# Patient Record
Sex: Female | Born: 1945
Health system: Southern US, Community
[De-identification: ages and names within clinical notes are randomized; demographics above are authoritative.]

## PROBLEM LIST (undated history)

## (undated) DIAGNOSIS — I10 Essential (primary) hypertension: Secondary | ICD-10-CM

## (undated) DIAGNOSIS — M1990 Unspecified osteoarthritis, unspecified site: Secondary | ICD-10-CM

## (undated) DIAGNOSIS — E119 Type 2 diabetes mellitus without complications: Secondary | ICD-10-CM

## (undated) DIAGNOSIS — J189 Pneumonia, unspecified organism: Secondary | ICD-10-CM

## (undated) HISTORY — PX: NASAL SINUS SURGERY: SHX719

## (undated) HISTORY — DX: Unspecified osteoarthritis, unspecified site: M19.90

## (undated) HISTORY — PX: BREAST LUMPECTOMY: SHX2

## (undated) HISTORY — PX: CATARACT EXTRACTION: SUR2

## (undated) HISTORY — PX: CARPAL TUNNEL RELEASE: SHX101

## (undated) HISTORY — PX: TUBAL LIGATION: SHX77

## (undated) HISTORY — PX: OTHER SURGICAL HISTORY: SHX169

## (undated) HISTORY — PX: ROTATOR CUFF REPAIR: SHX139

## (undated) HISTORY — PX: CHOLECYSTECTOMY: SHX55

## (undated) HISTORY — DX: Essential (primary) hypertension: I10

## (undated) HISTORY — PX: ANKLE SURGERY: SHX546

---

## 1998-03-10 ENCOUNTER — Other Ambulatory Visit: Admission: RE | Admit: 1998-03-10 | Discharge: 1998-03-10 | Payer: Self-pay | Admitting: Gynecology

## 1998-11-11 ENCOUNTER — Other Ambulatory Visit: Admission: RE | Admit: 1998-11-11 | Discharge: 1998-11-11 | Payer: Self-pay | Admitting: Gynecology

## 1999-05-11 ENCOUNTER — Other Ambulatory Visit: Admission: RE | Admit: 1999-05-11 | Discharge: 1999-05-11 | Payer: Self-pay | Admitting: Gynecology

## 2000-05-15 ENCOUNTER — Other Ambulatory Visit: Admission: RE | Admit: 2000-05-15 | Discharge: 2000-05-15 | Payer: Self-pay | Admitting: Gynecology

## 2000-05-30 ENCOUNTER — Ambulatory Visit: Admission: RE | Admit: 2000-05-30 | Discharge: 2000-05-30 | Payer: Self-pay | Admitting: Gynecology

## 2000-05-30 ENCOUNTER — Encounter (INDEPENDENT_AMBULATORY_CARE_PROVIDER_SITE_OTHER): Payer: Self-pay | Admitting: Specialist

## 2001-05-21 ENCOUNTER — Other Ambulatory Visit: Admission: RE | Admit: 2001-05-21 | Discharge: 2001-05-21 | Payer: Self-pay | Admitting: Gynecology

## 2001-09-17 ENCOUNTER — Encounter (INDEPENDENT_AMBULATORY_CARE_PROVIDER_SITE_OTHER): Payer: Self-pay | Admitting: Specialist

## 2001-09-17 ENCOUNTER — Inpatient Hospital Stay (HOSPITAL_COMMUNITY): Admission: EM | Admit: 2001-09-17 | Discharge: 2001-09-20 | Payer: Self-pay | Admitting: Internal Medicine

## 2002-02-05 ENCOUNTER — Encounter: Payer: Self-pay | Admitting: Gastroenterology

## 2002-02-05 ENCOUNTER — Ambulatory Visit (HOSPITAL_COMMUNITY): Admission: RE | Admit: 2002-02-05 | Discharge: 2002-02-05 | Payer: Self-pay | Admitting: Gastroenterology

## 2002-06-02 ENCOUNTER — Other Ambulatory Visit: Admission: RE | Admit: 2002-06-02 | Discharge: 2002-06-02 | Payer: Self-pay | Admitting: Gynecology

## 2003-02-01 ENCOUNTER — Encounter: Payer: Self-pay | Admitting: Internal Medicine

## 2003-02-01 ENCOUNTER — Encounter: Admission: RE | Admit: 2003-02-01 | Discharge: 2003-02-01 | Payer: Self-pay | Admitting: Internal Medicine

## 2003-06-04 ENCOUNTER — Other Ambulatory Visit: Admission: RE | Admit: 2003-06-04 | Discharge: 2003-06-04 | Payer: Self-pay | Admitting: Gynecology

## 2004-05-30 ENCOUNTER — Other Ambulatory Visit: Admission: RE | Admit: 2004-05-30 | Discharge: 2004-05-30 | Payer: Self-pay | Admitting: Gynecology

## 2005-01-15 ENCOUNTER — Encounter: Admission: RE | Admit: 2005-01-15 | Discharge: 2005-01-15 | Payer: Self-pay | Admitting: Orthopedic Surgery

## 2005-02-22 ENCOUNTER — Observation Stay (HOSPITAL_COMMUNITY): Admission: RE | Admit: 2005-02-22 | Discharge: 2005-02-23 | Payer: Self-pay | Admitting: Orthopedic Surgery

## 2005-06-22 ENCOUNTER — Other Ambulatory Visit: Admission: RE | Admit: 2005-06-22 | Discharge: 2005-06-22 | Payer: Self-pay | Admitting: Gynecology

## 2005-07-14 ENCOUNTER — Encounter: Admission: RE | Admit: 2005-07-14 | Discharge: 2005-07-14 | Payer: Self-pay | Admitting: Orthopedic Surgery

## 2005-12-11 ENCOUNTER — Ambulatory Visit: Payer: Self-pay | Admitting: Internal Medicine

## 2005-12-21 ENCOUNTER — Ambulatory Visit: Payer: Self-pay

## 2008-06-30 ENCOUNTER — Ambulatory Visit (HOSPITAL_COMMUNITY): Admission: RE | Admit: 2008-06-30 | Discharge: 2008-06-30 | Payer: Self-pay | Admitting: General Surgery

## 2008-06-30 ENCOUNTER — Encounter (INDEPENDENT_AMBULATORY_CARE_PROVIDER_SITE_OTHER): Payer: Self-pay | Admitting: General Surgery

## 2008-06-30 ENCOUNTER — Encounter (INDEPENDENT_AMBULATORY_CARE_PROVIDER_SITE_OTHER): Payer: Self-pay | Admitting: *Deleted

## 2010-01-06 ENCOUNTER — Telehealth: Payer: Self-pay | Admitting: Gastroenterology

## 2010-01-06 ENCOUNTER — Ambulatory Visit: Payer: Self-pay | Admitting: Internal Medicine

## 2010-01-06 ENCOUNTER — Encounter: Payer: Self-pay | Admitting: Gastroenterology

## 2010-01-06 DIAGNOSIS — R933 Abnormal findings on diagnostic imaging of other parts of digestive tract: Secondary | ICD-10-CM

## 2010-01-06 DIAGNOSIS — R932 Abnormal findings on diagnostic imaging of liver and biliary tract: Secondary | ICD-10-CM

## 2010-01-06 DIAGNOSIS — R1031 Right lower quadrant pain: Secondary | ICD-10-CM

## 2010-01-06 DIAGNOSIS — R110 Nausea: Secondary | ICD-10-CM | POA: Insufficient documentation

## 2010-01-06 DIAGNOSIS — R1033 Periumbilical pain: Secondary | ICD-10-CM | POA: Insufficient documentation

## 2010-01-06 DIAGNOSIS — R1030 Lower abdominal pain, unspecified: Secondary | ICD-10-CM | POA: Insufficient documentation

## 2010-01-14 ENCOUNTER — Ambulatory Visit: Payer: Self-pay | Admitting: Gastroenterology

## 2010-01-14 ENCOUNTER — Ambulatory Visit (HOSPITAL_COMMUNITY): Admission: RE | Admit: 2010-01-14 | Discharge: 2010-01-14 | Payer: Self-pay | Admitting: Gastroenterology

## 2010-01-15 LAB — HM COLONOSCOPY

## 2010-01-18 ENCOUNTER — Encounter: Payer: Self-pay | Admitting: Gastroenterology

## 2010-09-08 NOTE — Procedures (Signed)
Summary: Colonoscopy  Patient: Denise [name] Note: All result statuses are Final unless otherwise noted.  Tests: (1) Colonoscopy (COL)   COL Colonoscopy           DONE     [facility]     [address] [place], [place]  [address]           COLONOSCOPY PROCEDURE REPORT           PATIENT:  Denise, [name]  MR#:  [id number]     BIRTHDATE:  August 13, 1945, 64 yrs. old  GENDER:  female     ENDOSCOPIST:  Barbette Hair. Arlyce Dice, MD     REF. BY:     PROCEDURE DATE:  01/14/2010     PROCEDURE:  Colonoscopy with snare polypectomy     ASA CLASS:  Class I     INDICATIONS:  colorectal cancer screening, average risk     MEDICATIONS:   Fentanyl 100 mcg, Versed 10 mg IV, Benadryl 50 mg     IV           DESCRIPTION OF PROCEDURE:   After the risks benefits and     alternatives of the procedure were thoroughly explained, informed     consent was obtained.  Digital rectal exam was performed and     revealed no abnormalities.   The  endoscope was introduced through     the anus and advanced to the cecum, which was identified by both     the appendix and ileocecal valve, without limitations.  The     quality of the prep was excellent, using MoviPrep.  The instrument     was then slowly withdrawn as the colon was fully examined.     <<PROCEDUREIMAGES>>           FINDINGS:  A pedunculated polyp was found in the sigmoid colon. It     was 15 mm in size. It was found 22 cm from the point of entry.     Polyp was snared, then cauterized with monopolar cautery.     Retrieval was successful (see image9 and image10). snare polyp     Mild diverticulosis was found in the sigmoid colon (see image11 and     image12).  Internal hemorrhoids were found (see image14).  This     was otherwise a normal examination of the colon (see image1,     image2, image3, image5, image6, and image8).   Retroflexed views     in the rectum revealed no abnormalities.    The scope was then     withdrawn from the patient and  the procedure completed.           COMPLICATIONS:  None     ENDOSCOPIC IMPRESSION:     1) 15 mm pedunculated polyp in the sigmoid colon     2) Mild diverticulosis in the sigmoid colon     3) Internal hemorrhoids     4) Otherwise normal examination     RECOMMENDATIONS:     1) Colonoscopy     REPEAT EXAM:  In 3 year(s) for Colonoscopy. in view of size of     polyp; sedation with propofol           ______________________________     Barbette Hair. Arlyce Dice, MD           CCMorton Amy MD           n.     eSIGNED:  Barbette Hair. Damika Harmon at 01/14/2010 03:05 PM           Briley, Elease Hashimoto, [phone]  Note: An exclamation mark (!) indicates a result that was not dispersed into the flowsheet. Document Creation Date: 01/14/2010 3:07 PM _______________________________________________________________________  (1) Order result status: Final Collection or observation date-time: 01/14/2010 14:59 Requested date-time:  Receipt date-time:  Reported date-time:  Referring Physician:   Ordering Physician: Melvia Heaps [phone]) Specimen Source:  Source: Launa Grill Order Number: [id number] Lab site:   Appended Document: Colonoscopy Recall is in IDX for 01/2013.

## 2010-09-08 NOTE — Assessment & Plan Note (Signed)
Summary: severe abd pain...em                (DR.KAPLAN PT./DEB)   History of Present Illness Visit Type: Initial Consult Primary GI MD: Melvia Heaps MD [facility] Primary Provider: Blane Ohara, MD Requesting Provider: Blane Ohara, MD Chief Complaint: Severe abdominal pain x1 week History of Present Illness:   PLEASANT 65 Y.O FEMALE KNOWN REMOTELY TO DR. KAPLAN FROM ENDOSCOPY IN 2003 WHICH WAS NORMAL. SHE IS S/P CHOLECYSTECTOMY AND NORMAL IOC IN 2009. SHE COMES IN TODAY AFTER ONSET OF INTENSE MID ABDOMINAL PAIN ON SUNDAY 5/29. SHE DESCRIBES IT AS  SEVERE CRAMPY PAINS FOLLOWED BY [facility]. SHE HAD NAUSEA BUT NO VOMITING. EPISODES OF CRAMY PAIN ALL NIGHT,FELT URGE FOR BM BUT NO DIARRHEA. NO DIAPHORESIS. SHE CONTINUED TO HAVE PAIN MONDAY,TUESDAY BETTER DURING THE DAY THEN WORSE THAT NIGHT. SHE WAS SEEN [place] DR COX,HAD LABS YESTERDAY INCLUDING LFT'S AND LIPASE-ALL NORMAL. SHE UNDERWENT CT ABD/PELVIS WITH CONTRAST WHICH SHOWED NO ACUTE PROBLEM. SHE DOES HAVE MULTIPLE HEPATIC CYSTS,THE LARGEST 3 CM,INCREASED STOOL IN COLON. TODAY AND LAST NIGHT HAS FELT BETTER. SHE HAS BEEN ON BENTYL 10 MG ONCE DAILY FOR A YEAR OR SO FOR ABDOMINAL DISCOMFORT, GAS ETC. SHE HAS INCREASED IT TO TWICE DAILY NOW. SHE IS HAVING BM'S, NO MELENA OR HEME. NO FEVER. HAS BEEN ON CELEBREX ONCE DAILY LONG TERM.   GI Review of Systems    Reports abdominal pain, bloating, chest pain, and  nausea.     Location of  Abdominal pain: upper abdomen.    Denies acid reflux, belching, dysphagia with liquids, dysphagia with solids, heartburn, loss of appetite, vomiting, vomiting blood, weight loss, and  weight gain.        Denies anal fissure, black tarry stools, change in bowel habit, constipation, diarrhea, diverticulosis, fecal incontinence, heme positive stool, hemorrhoids, irritable bowel syndrome, jaundice, light color stool, liver problems, rectal bleeding, and  rectal pain. Preventive Screening-Counseling & Management  Alcohol-Tobacco  Smoking Status: never      Drug Use:  no.      Current Medications (verified): 1)  Multivitamins  Tabs (Multiple Vitamin) .... Once Daily 2)  Caltrate 600 1500 Mg Tabs (Calcium Carbonate) .... Once Daily 3)  Vitamin C Cr 500 Mg Cr-Tabs (Ascorbic Acid) .... Once Daily 4)  Celebrex 200 Mg Caps (Celecoxib) .... Once Daily 5)  Evista 60 Mg Tabs (Raloxifene Hcl) .... Once Daily 6)  Dicyclomine Hcl 10 Mg Caps (Dicyclomine Hcl) .... Two Times A Day  Allergies (verified): No Known Drug Allergies  Past History:  Past Medical History: Arthritis DVT -childhood  Past Surgical History: Breast-Lumpectomy x5 Cholecystectomy Rotator Cuff Repair Tubal Ligation Ankle surgery Cataract Extraction  Family History: Family History of Breast Cancer:Aunts Family History of Liver Disease/Cirrhosis:Father No FH of Colon Cancer: Bone Cancer: Mother  Social History: Occupation: Self employed Patient has never smoked.  Alcohol Use - no Daily Caffeine Use Illicit Drug Use - no Smoking Status:  never Drug Use:  no  Review of Systems       The patient complains of arthritis/joint pain, back pain, fatigue, and shortness of breath.  The patient denies allergy/sinus, anemia, anxiety-new, blood in urine, breast changes/lumps, change in vision, confusion, cough, coughing up blood, depression-new, fainting, fever, headaches-new, hearing problems, heart murmur, heart rhythm changes, itching, menstrual pain, muscle pains/cramps, night sweats, nosebleeds, pregnancy symptoms, skin rash, sleeping problems, sore throat, swelling of feet/legs, swollen lymph glands, thirst - excessive , urination - excessive , urination changes/pain, urine leakage, vision  changes, and voice change.         ROS OTHERWISE AS IN HPI  Vital Signs:  Patient profile:   65 year old female Height:      62 inches Weight:      166 pounds BMI:     30.47 Pulse rate:   76 / minute Pulse rhythm:   regular BP sitting:   132 / 76  (left  arm) Cuff size:   regular  Vitals Entered By: June McMurray CMA Duncan Dull) (January 06, 2010 1:35 PM)  Physical Exam  General:  Well developed, well nourished, no acute distress. Head:  Normocephalic and atraumatic. Eyes:  PERRLA, no icterus. Lungs:  Clear throughout to auscultation. Heart:  Regular rate and rhythm; no murmurs, rubs,  or bruits. Abdomen:  SOFT, MILD TENDERNESS, RMQ, NO MASS OR HSM, NO GUARDING, NO DISTENTION, BS+ Rectal:  NOT DONE Extremities:  No clubbing, cyanosis, edema or deformities noted. Neurologic:  Alert and  oriented x4;  grossly normal neurologically. Psych:  Alert and cooperative. Normal mood and affect.   Impression & Recommendations:  Problem # 1:  ABDOMINAL PAIN, PERIUMBILIC (ICD-789.05) Assessment New 65 Y.O FEMALE WITH 5 DAY HX OF PERIUMBILICAL ABDOMINAL CRAMPING AND NAUSEA, IMPROVING. NORMAL LABS ,UNREMARKABLE CT AS TO CAUSE. SUSPECT AN INFECTIOUS GASTROENTERITIS-NOW RESOLVING.  CONTINUE BENTYL 10 MG TWICE DAILY PT ADVISED TO CALL IF PAIN RECURS SHE HAS NOT HAD SCREENING COLONOSCOPY,THIS SHOULD BE DONE, AND WILL ALSO HELP R/O COLON PATHOLOGY AS A SOURCE FOR HER PAIN. PROCEDURE DISCUSSED IN DETAIL ,SHE AGREES TO PROCEED,WILL SCHEDULE WITH DR. KAPLAN. Orders: Colonoscopy (Colon)  Problem # 2:  ABNORMAL EXAM-BILIARY TRACT (ICD-793.3) Assessment: Comment Only SEVERAL HEPATIC CYSTS ON CT.  Patient Instructions: 1)  Continue the Dicyclomine 10 MG twice daily. 2)  We scheduled the Colonoscopy with Dr. Arlyce Dice for Friday 01-14-10 at 2:00 PM.   3)  Directions and brochure given. 4)  [facility] Patient Information Guide given to patient. 5)  We sent the perscription for the Moviprep you will be drinking for the procedure to [facility]   [address], [place] Levan.  6)  Copy sent to : Danie Binder, MD 7)  The medication list was reviewed and reconciled.  All changed / newly prescribed medications were explained.  A complete medication list was provided to  the patient / caregiver. Prescriptions: MOVIPREP 100 GM  SOLR (PEG-KCL-NACL-NASULF-NA ASC-C) As per prep instructions.  #1 x 0   Entered by:   Lowry Ram NCMA   Authorized by:   Sammuel Cooper PA-c   Signed by:   Lowry Ram NCMA on 01/06/2010   Method used:   [facility]ronically to        [facility]  [id number] (retail)       [address]       [place], [place]  [address]       Ph: [phone] or [phone]       Fax: [phone]   RxID:   [id number]

## 2010-09-08 NOTE — Letter (Signed)
Summary: Patient Notice- Polyp Results  [facility]  [address] [place], [place] [address]   Phone: [phone]  Fax: [phone]        January 18, 2010 MRN: [id number]    [facility] [address] [place], [place]  [address]    Dear Ms. Forst,  I am pleased to inform you that the colon polyp(s) removed during your recent colonoscopy was (were) found to be benign (no cancer detected) upon pathologic examination.  I recommend you have a repeat colonoscopy examination in 3_ years to look for recurrent polyps, as having colon polyps increases your risk for having recurrent polyps or even colon cancer in the future.  Should you develop new or worsening symptoms of abdominal pain, bowel habit changes or bleeding from the rectum or bowels, please schedule an evaluation with either your primary care physician or with me.  Additional information/recommendations:  __ No further action with gastroenterology is needed at this time. Please      follow-up with your primary care physician for your other healthcare      needs.  __ Please call [phone] to schedule a return visit to review your      situation.  __ Please keep your follow-up visit as already scheduled.  _x_ Continue treatment plan as outlined the day of your exam.  Please call us if you are having persistent problems or have questions about your condition that have not been fully answered at this time.  Sincerely,  Louis Meckel MD  This letter has been electronically signed by your physician.  Appended Document: Patient Notice- Polyp Results Letter mailed to patient. Recall is in IDX for 01/2013.

## 2010-09-08 NOTE — Letter (Signed)
Summary: [facility] Instructions  [facility]  [address] [place], [place] [address]   Phone: [phone]  Fax: [phone]       Denise [name]    06-04-1946    MRN: [id number]        Procedure Day /Date: 01-14-10     Arrival Time: 1:00 PM      Procedure Time: 2:00 PM     Location of Procedure:                     X      [facility] ( Outpatient Registration)                        PREPARATION FOR COLONOSCOPY WITH MOVIPREP   Starting 5 days prior to your procedure 9:00 AM  do not eat nuts, seeds, popcorn, corn, beans, peas,  salads, or any raw vegetables.  Do not take any fiber supplements (e.g. Metamucil, Citrucel, and Benefiber).  THE DAY BEFORE YOUR PROCEDURE         DATE: 01-13-10  UVO:[id number]  1.  Drink clear liquids the entire day-NO SOLID FOOD  2.  Do not drink anything colored red or purple.  Avoid juices with pulp.  No orange juice.  3.  Drink at least 64 oz. (8 glasses) of fluid/clear liquids during the day to prevent dehydration and help the prep work efficiently.  CLEAR LIQUIDS INCLUDE: Water Jello Ice Popsicles Tea (sugar ok, no milk/cream) Powdered fruit flavored drinks Coffee (sugar ok, no milk/cream) Gatorade Juice: apple, white grape, white cranberry  Lemonade Clear bullion, consomm, broth Carbonated beverages (any kind) Strained chicken noodle soup Hard Candy                             4.  In the morning, mix first dose of MoviPrep solution:    Empty 1 Pouch A and 1 Pouch B into the disposable container    Add lukewarm drinking water to the top line of the container. Mix to dissolve    Refrigerate (mixed solution should be used within 24 hrs)  5.  Begin drinking the prep at 5:00 p.m. The MoviPrep container is divided by 4 marks.   Every 15 minutes drink the solution down to the next mark (approximately 8 oz) until the full liter is complete.   6.  Follow completed prep with 16 oz of clear liquid of your choice  (Nothing red or purple).  Continue to drink clear liquids until bedtime.  7.  Before going to bed, mix second dose of MoviPrep solution:    Empty 1 Pouch A and 1 Pouch B into the disposable container    Add lukewarm drinking water to the top line of the container. Mix to dissolve    Refrigerate  THE DAY OF YOUR PROCEDURE      DATE: 01-14-10 DAY: Friday  Beginning at 9:00 AM  (5 hours before procedure):         1. Every 15 minutes, drink the solution down to the next mark (approx 8 oz) until the full liter is complete.  2. Follow completed prep with 16 oz. of clear liquid of your choice.    3. You may drink clear liquids until 10:00 AM  (4 HOURS BEFORE PROCEDURE).   MEDICATION INSTRUCTIONS  Unless otherwise instructed, you should take regular prescription medications with a small sip of water  as early as possible the morning of your procedure.        OTHER INSTRUCTIONS  You will need a responsible adult at least 65 years of age to accompany you and drive you home.   This person must remain in the waiting room during your procedure.  Wear loose fitting clothing that is easily removed.  Leave jewelry and other valuables at home.  However, you may wish to bring a book to read or  an iPod/MP3 player to listen to music as you wait for your procedure to start.  Remove all body piercing jewelry and leave at home.  Total time from sign-in until discharge is approximately 2-3 hours.  You should go home directly after your procedure and rest.  You can resume normal activities the  day after your procedure.  The day of your procedure you should not:   Drive   Make legal decisions   Operate machinery   Drink alcohol   Return to work  You will receive specific instructions about eating, activities and medications before you leave.    The above instructions have been reviewed and explained to me by   _______________________    I fully understand and can verbalize  these instructions _____________________________ Date _________

## 2010-09-08 NOTE — Procedures (Signed)
Summary: ENDOSCOPY   EGD  Procedure date:  02/05/2002  Findings:      Location: [facility]     Patient Name: Denise [name], Denise [name]. MRN: [id number] Procedure Procedures: Panendoscopy (EGD) CPT: 43235.  Personnel: Endoscopist: Barbette Hair. Arlyce Dice, MD.  Indications Symptoms: Chronic cough.  History  Pre-Exam Physical: Performed Feb 05, 2002  Entire physical exam was normal.  Exam Exam Info: Maximum depth of insertion Duodenum, intended Duodenum. Vocal cords visualized. Gastric retroflexion performed. ASA Classification: II. Tolerance: good.  Sedation Meds: Robinul 0.2 given IV. Fentanyl 75 mcg. given IV. Versed 7 mg. given IV. Cetacaine Spray 2 sprays given aerosolized.  Monitoring: BP and pulse monitoring done. Oximetry used. Supplemental O2 given at 2 Liters.  Findings - Normal: Proximal Esophagus to Duodenal 2nd Portion.   Assessment Normal examination.  Events  Unplanned Intervention: No unplanned interventions were required.  Unplanned Events: There were no complications. Plans Medication(s): Continue current medications. PPI: Lansoprazole/Prevacid 30 mg QD, starting Feb 05, 2002   Comments: Lower prevacid to QD Scheduling: Office Visit, to [employer]. Arlyce Dice, MD, around Mar 05, 2002.    CC: Denise [name]   This report was created from the original endoscopy report, which was reviewed and signed by the above listed endoscopist.

## 2010-09-08 NOTE — Procedures (Signed)
Summary: Preparation for Colonoscopy / [facility]  Preparation for Colonoscopy / [facility]   Imported By: Lennie Odor 01/13/2010 15:31:21  _____________________________________________________________________  External Attachment:    Type:   Image     Comment:   External Document

## 2010-09-08 NOTE — Progress Notes (Signed)
Summary: ASAP APPT.  Phone Note From Other Clinic Call back at [phone]   Caller: Eber Jones from Dr Blane Ohara office Call For: DR.KAPLAN Reason for Call: Schedule Patient Appt Summary of Call: Dr Sedalia Muta would like this patient before appt given to her 7-8 for severe abd pain off/on for about a week.  (on scale from 1-10 is a 10) Initial call taken by: Tawni Levy,  January 06, 2010 9:59 AM  Follow-up for Phone Call        Pt. can see Mike Gip [facility] today or tomorrow, Eber Jones will call pt. and call back to confirm a date/time. Follow-up by: Laureen Ochs LPN,  January 07, 8755 11:22 AM  Additional Follow-up for Phone Call Additional follow up Details #1::        Pt. will see Mike Gip [facility] today at 2pm. Eber Jones will fax records to [facility] Additional Follow-up by: Laureen Ochs LPN,  January 07, 4331 11:32 AM

## 2010-09-08 NOTE — Op Note (Signed)
Summary: OPERATIVE REPORT  NAME:  Denise [name]            ACCOUNT NO.:  [id number]      MEDICAL RECORD NO.:  [id number]          PATIENT TYPE:  AMB      LOCATION:  DAY                          FACILITY:  [facility]      PHYSICIAN:  Anselm Pancoast. Weatherly, M.D.DATE OF BIRTH:  06/20/1946      DATE OF PROCEDURE:  06/30/2008   DATE OF DISCHARGE:                                  OPERATIVE REPORT      REFERRING PHYSICIAN:  [facility].      PREOPERATIVE DIAGNOSES:  Chronic cholelithiasis with stones.      POSTOPERATIVE DIAGNOSES:  Chronic cholecystitis with stones.      OPERATION:  Laparoscopic cholecystectomy with cholangiogram.      SURGEON:  Anselm Pancoast. Zachery Dakins, M.D.      ASSISTANT:  Angelia Mould. Derrell Lolling, M.D.      HISTORY:  Denise [name] is a 62-year Caucasian female who I saw   approximately 2 months ago after she was having episodes of symptomatic   gallstones.  She works as a Nurse, learning disability for the deaf and wanted to   postpone her surgery until now so she has got a short week to miss as   little work as possible.  She is here for the planned procedure and said   that she has had to three episodes of pain that lasted an hour or so   during this 2 month interval, but has  had no real severe attacks like   she had prior to adjusting her diet.  Her preoperative repeat laboratory   studies were all normal and preoperatively she was given 3 grams of   Unasyn and PAS stockings.      The patient was taken to the operative suite.  Induction of general   anesthesia endotracheal tube, oral tube into the stomach and the abdomen   was prepped with Betadine solution and draped in a sterile manner.  I   think she has had a previous tubal ligation and I made a little vertical   incision below the umbilicus.  The fascia was identified, picked up and   a small opening made and we carefully entered into the peritoneal   cavity.  A pursestring suture of zero Vicryl was placed and then the   Hasson cannula introduced.  The gallbladder, you could see the stones in   the gallbladder, but there is chronic thin adhesions around it.  These   were carefully taken down after the upper 10 mm trocar had been placed   in the subxiphoid and two lateral 5 mm trocars placed by Dr. Derrell Lolling.  We   freed up and went down and could see the stone impacted in the neck of   the gallbladder and cystic duct junction.  The adhesions around this   were carefully divided.  You could see the cystic artery that was   clipped proximally and then after I encompassed the cystic duct and   pushed the stone back into the actual gallbladder I placed a clip on the   cystic duct.  The small opening made and a Cook catheter introduced and   the cholangiogram obtained.  There was good prompt filling of   extrahepatic biliary system.  Good flow into the duodenum and no   evidence of the any common duct stones.  The catheter was removed.  The   cystic duct proximal was triply clipped, divided.  The artery, the clip   placed and it was divided and then we freed up the gallbladder from its   bed with the hook electrocautery and good hemostasis.  The gallbladder   and stones were placed in the EndoCatch bag.  The camera switched to the   upper 10 mL trocar and the bag containing the gallbladder withdrawn.   There was a little irrigating fluid that was aspirated.  No evidence any   bleeding.  I put an additional figure-of-eight suture of zero Vicryl in   the fascia at the umbilicus since she is thin and you could see into the   peritoneal cavity.  At the upper 10 mm trocar I placed a figure-of-eight   in that fascia also with   zero Vicryl.  The subcutaneous wounds were closed with Benzoin and Steri-   Strips after a couple of 4-0 Vicryl sutures had been placed   subcuticular.  The patient tolerated the procedure nicely and I think   she is planning on going home this afternoon and hopefully will be ready   to return to  work on Monday.                  ______________________________   Anselm Pancoast. Zachery Dakins, M.D.            WJW/MEDQ  D:  06/30/2008  T:  06/30/2008  Job:  [id number]      cc:   Iva Boop, MD,FACG   [facility]   [address] [place], [place] [address]

## 2010-09-15 ENCOUNTER — Other Ambulatory Visit: Payer: Self-pay | Admitting: Gynecology

## 2010-12-20 NOTE — Op Note (Signed)
NAME:  Denise [name], Denise [name]            ACCOUNT NO.:  [id number]   MEDICAL RECORD NO.:  [id number]          PATIENT TYPE:  AMB   LOCATION:  DAY                          FACILITY:  [facility]   PHYSICIAN:  Anselm Pancoast. Weatherly, M.D.DATE OF BIRTH:  [birth year]   DATE OF PROCEDURE:  06/30/2008  DATE OF DISCHARGE:                               OPERATIVE REPORT   REFERRING PHYSICIAN:  ER physicians.   PREOPERATIVE DIAGNOSES:  Chronic cholelithiasis with stones.   POSTOPERATIVE DIAGNOSES:  Chronic cholecystitis with stones.   OPERATION:  Laparoscopic cholecystectomy with cholangiogram.   SURGEON:  Anselm Pancoast. Zachery Dakins, M.D.   ASSISTANT:  Angelia Mould. Derrell Lolling, M.D.   HISTORY:  Denise [name] is a 62-year Caucasian female who I saw  approximately 2 months ago after she was having episodes of symptomatic  gallstones.  She works as a Nurse, learning disability for the deaf and wanted to  postpone her surgery until now so she has got a short week to miss as  little work as possible.  She is here for the planned procedure and said  that she has had to three episodes of pain that lasted an hour or so  during this 2 month interval, but has  had no real severe attacks like  she had prior to adjusting her diet.  Her preoperative repeat laboratory  studies were all normal and preoperatively she was given 3 grams of  Unasyn and PAS stockings.   The patient was taken to the operative suite.  Induction of general  anesthesia endotracheal tube, oral tube into the stomach and the abdomen  was prepped with Betadine solution and draped in a sterile manner.  I  think she has had a previous tubal ligation and I made a little vertical  incision below the umbilicus.  The fascia was identified, picked up and  a small opening made and we carefully entered into the peritoneal  cavity.  A pursestring suture of zero Vicryl was placed and then the  Hasson cannula introduced.  The gallbladder, you could see the stones in  the  gallbladder, but there is chronic thin adhesions around it.  These  were carefully taken down after the upper 10 mm trocar had been placed  in the subxiphoid and two lateral 5 mm trocars placed by Dr. Derrell Lolling.  We  freed up and went down and could see the stone impacted in the neck of  the gallbladder and cystic duct junction.  The adhesions around this  were carefully divided.  You could see the cystic artery that was  clipped proximally and then after I encompassed the cystic duct and  pushed the stone back into the actual gallbladder I placed a clip on the  cystic duct.  The small opening made and a Cook catheter introduced and  the cholangiogram obtained.  There was good prompt filling of  extrahepatic biliary system.  Good flow into the duodenum and no  evidence of the any common duct stones.  The catheter was removed.  The  cystic duct proximal was triply clipped, divided.  The artery, the clip  placed and it was divided and  then we freed up the gallbladder from its  bed with the hook electrocautery and good hemostasis.  The gallbladder  and stones were placed in the EndoCatch bag.  The camera switched to the  upper 10 mL trocar and the bag containing the gallbladder withdrawn.  There was a little irrigating fluid that was aspirated.  No evidence any  bleeding.  I put an additional figure-of-eight suture of zero Vicryl in  the fascia at the umbilicus since she is thin and you could see into the  peritoneal cavity.  At the upper 10 mm trocar I placed a figure-of-eight  in that fascia also with  zero Vicryl.  The subcutaneous wounds were closed with Benzoin and Steri-  Strips after a couple of 4-0 Vicryl sutures had been placed  subcuticular.  The patient tolerated the procedure nicely and I think  she is planning on going home this afternoon and hopefully will be ready  to return to work on Monday.           ______________________________  Anselm Pancoast. Zachery Dakins, M.D.      WJW/MEDQ  D:  06/30/2008  T:  06/30/2008  Job:  [id number]   cc:   Iva Boop, MD,FACG  [facility]  [address] [place], [place] [address]

## 2010-12-23 NOTE — Consult Note (Signed)
[facility]  Patient:    Denise [name], Denise [name]                          MRN: [id number] Attending:  Rande Brunt. Stanford Breed, M.D. CC:         Luvenia Redden, M.D.  Telford Nab, R.N.   Consultation Report  HISTORY OF PRESENT ILLNESS:  A 65 year old white female referred by Luvenia Redden, M.D., for evaluation of hyperpigmented lesions on the vulva.  The patient is perimenopausal.  She is not certain as to when she first developed these hyperpigmented lesions, as they are entirely asymptomatic. They were identified by Dr. Lodema Hong on his visit with the patient on October 9.  She denies any vulvitis, pain, pressure, or any other gynecologic symptoms.  She has no past history of abnormal Pap smears.  PHYSICAL EXAMINATION:  VITAL SIGNS:  Height 5 feet 2 inches, weight 164 pounds.  Blood pressure 130/94, pulse 84, respiratory rate 16.  ABDOMEN:  Soft, nontender.  No mass, organomegaly, ascites, or hernias are noted.  PELVIC:  EG, BUS is normal anatomically, although there are patchy hyperpigmented areas on both sides of the vulva.  The vagina is otherwise normal.  Cervix is normal.  PROCEDURE NOTE:  Since we were uncertain as to the etiology of this hyperpigmented area, biopsy will be obtained.  After Betadine prep and 1% Xylocaine anesthesia, a biopsy is obtained from the hyperpigmented area on the right labia minor.  Silver nitrate is used for hemostasis.  IMPRESSION:  Hyperpigmented lesion of the vulva, probably benign.  We will await biopsy reports before making further recommendations. DD:  05/30/00 TD:  05/31/00 Job: [id number]

## 2010-12-23 NOTE — Discharge Summary (Signed)
[facility]  Patient:    Denise [name], Denise [name] [facility] Visit Number: [id number] MRN: [id number]          Service Type: MED Location: [phone] Attending Physician:  Avie Echevaria Dictated by:   Earley Favor, RN, MSN, ACNP Admit Date:  09/17/2001 Discharge Date: 09/20/2001                             Discharge Summary  DISCHARGE DIAGNOSES: 1. Cyclic cough. 2. Sinusitis.  HISTORY OF PRESENT ILLNESS:  This is a 65 year old patient of Dr. Sandrea Hughs whom he saw August 29, 2001, for chronic cough.  She had been treated for the previous 10 months by Dr. Shelle Iron for asthma with followup pulmonary function tests demonstrating no active asthma.  After seeing Dr. Danice Goltz on August 08, 2001, she presented to Dr. Sandrea Hughs for treatment for cyclic cough.  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  She had high-dose Tussionex with about 25% improvement in her cough, but she continues to have a cyclic cough.  This cough get to the point where she becomes incontinent of urine with stress.  She presented to Dr. Sandrea Hughs after one week of treatment.  Tussionex was continued.  She was also given Singulair and Neurontin.  Unfortunately, she did not improve with outpatient pharmaceutical intervention and, therefore, was admitted for further evaluation and treatment due to refractory nature of her cough.  PROCEDURES:  Fiberoptic bronchoscopy performed by Dr. Sandrea Hughs with bronchoalveolar lavage performed.  She was noted to have severe and chronic bronchitis of unclear etiology.  Of note, the larynx was exquisitely sensitive to topical lidocaine but otherwise unremarkable in terms of structure and function.  There was diffuse evidence of airway edema and erythema with no purulent  secretions identified.  However, the mucosa appeared more friable than normal.  No endobronchial lesions or abnormalities were appreciated.  LABORATORY DATA:  Limited CT of the sinuses showed right maxillary and sphenoid sinus disease.  AFB cultures and smears are pending.  Respiratory cultures are pending.  Fluid cell type demonstrates reddened color, turbid appearance, wbcs 7.8, neutrophils 95, lymphocytes 4, monocytes 1, and eosinophils 0.  WBC 13.3, hemoglobin 15.4, hematocrit 46.7, platelets 342.  Sodium 137, potassium 3.2, chloride 103, CO2 26, glucose 98, BUN 20, creatinine 1.0, calcium 9.2.  HOSPITAL COURSE:  #1 - CYCLIC COUGH:  Of note, the cyclic cough did improve with very aggressive cough suppressant consistent of codeine around the clock along with Delsym cough suppressant with treatment for gastroesophageal reflux disease with proton pump inhibitors along with Reglan for increased GI motility.  Her cough did improve with these interventions.  #2 - SINUSITIS:  CT of the sinuses demonstrated sinusitis most likely a culprit of one component of her cyclic cough.  She was treated aggressively with antimicrobial therapy along with nasal hygiene.  She was discharged home on a prednisone taper along with 19 more days to equal 21 days of antimicrobial therapy with Augmentin.  DISCHARGE MEDICATIONS:  1. E-Vista 60 mg q.d.  2. Nexium 40 mg 1 b.i.d.  3. Delsym 5 cc cough syrup b.i.d. p.r.n. for cough.  4. Tussionex 100 mg 2 tablets 4 times a day p.r.n. for cough.  5. Codeine 60 mg 1 tablet  every 4 hours p.r.n. for cough.  6. Reglan 10 mg q.i.d.  7. Augmentin 175 mg 1 tablet b.i.d. for the next 19 days.  8. Nasal hygiene consisting of Afrin nasal spray 1 puff b.i.d. for     5 days and then stop.  9. Nasalide 1 puff b.i.d. 5 minutes after Afrin. 10. Salt water nasal spray 2 puffs q.i.d. 11. Prednisone taper 40 mg for 3 days, 30 mg for 3 days, 20 mg for     3 days, 10 mg for 3  days, and then stop.  DIET:  No restriction.  SPECIAL INSTRUCTIONS:  She was given a flutter valve to utilize 4 times a day and given instructions on how to utilize it.  FOLLOWUP:  She has a followup appointment with Dr. Sandrea Hughs September 26, 2001, at 2:20 p.m.  DISPOSITION/CONDITION UPON DISCHARGE:  Cyclic cough is improved with aggressive cough suppression.  Sinusitis has been demonstrated with limited CT of the sinuses, and this is being treated with aggressive antimicrobial therapy.  She is discharged home in improved condition. Dictated by:   Earley Favor, RN, MSN, ACNP Attending Physician:  Avie Echevaria DD:  09/20/01 TD:  09/20/01 Job: [id number]

## 2010-12-23 NOTE — Op Note (Signed)
NAME:  Denise [name], Denise [name]            ACCOUNT NO.:  [id number]   MEDICAL RECORD NO.:  [id number]          PATIENT TYPE:  AMB   LOCATION:  DAY                          FACILITY:  [facility]   PHYSICIAN:  Georges Lynch. Gioffre, M.D.DATE OF BIRTH:  [birth year]   DATE OF PROCEDURE:  02/22/2005  DATE OF DISCHARGE:                                 OPERATIVE REPORT   SURGEON:  Georges Lynch. Darrelyn Hillock, M.D.   ASSISTANT:  Nurse.   PREOPERATIVE DIAGNOSES:  1.  Severe impingement syndrome, left shoulder.  2.  Partial tear of the rotator cuff tendon, left shoulder.   POSTOPERATIVE DIAGNOSES:  1.  Severe impingement syndrome, left shoulder.  2.  Partial tear of the rotator cuff tendon, left shoulder.   OPERATION:  1.  Partial acromionectomy and acromioplasty, left shoulder.  2.  Repair of the rotator cuff tendon utilizing a Restore tendon graft      patch.   DESCRIPTION OF PROCEDURE:  Prior to general anesthesia, the patient had an  interscalene nerve block on the left. She was then taken back to surgery,  given a general anesthetic and a routine prep and draping of the left  shoulder was carried out. She had 1 g of IV Ancef. Following this, an  incision was made over the anterior aspect of the left shoulder, bleeders  identified and cauterized. Following that, I then went down and inserted the  self retaining retractors. I incised the deltoid tendon by sharp dissection  and partially incised the proximal portion of the deltoid muscle. I then  went down and exposed the acromion, she had severe impingement syndrome. Her  subdeltoid bursa was markedly thickened and inflamed. I protected the  underlying soft tissue structures and then utilized the oscillating saw and  did a partial acromionectomy and then used a bur to even out the  undersurface of the acromion. I then bone waxed the under surface of the  acromion. I thoroughly irrigated out the area and then inserted some  thrombin soaked Gelfoam up into the  subacromial space. Following that, I  then went down and excised the subdeltoid bursa. Her rotator cuff was quite  abraded, she had under surface tears reported on the MRI. The tendon was  thinned out so I then reinforced it with a Restore tendon graft. I  thoroughly irrigated the wound. The deltoid tendon muscle then was  reapproximated in the usual fashion. The subcu was closed with #0 Vicryl,  the skin was closed with metal staples and a sterile neosporin dressing was  applied. She was then placed in a shoulder immobilizer. The patient left the  operating room in satisfactory condition. She had 1 g of IV Ancef preop.       RAG/MEDQ  D:  02/22/2005  T:  02/22/2005  Job:  [id number]

## 2010-12-23 NOTE — Procedures (Signed)
[facility]  Patient:    Denise [name], Denise [name] [facility] Visit Number: [id number] MRN: [id number]          Service Type: MED Location: [phone] Attending Physician:  Avie Echevaria Dictated by:   Charlaine Dalton. Sherene Sires, M.D. [facility] Proc. Date: 09/18/01 Admit Date:  09/17/2001                             Procedure Report  PROCEDURE:  Fiberoptic bronchoscopy diagnostic with lavage.  REFERRING PHYSICIAN:  This patient is self-referred.  HISTORY:  This is an exceptionally challenging 65 year old white female with chronic cough that could not be managed as an outpatient.  She was only "25% better" coughing violently on high-dose narcotic cough medications, and I admitted her yesterday for evaluation.  I performed a sinus CT scan which showed minimal air fluid level on the right maxillary sinus consistent with sinusitis, but I thought her cough was markedly disproportionate to findings on CT scan and recommended a "quick look" bronchoscopy, diagnostic with lavage to obtain sputum for cytology as well as eosinophil count to rule out eosinophilic bronchitis.  The procedure was performed in the bronchoscopy suite after full discussion of risks, benefits, and alternatives.  She received a total of 2.5 mg of IV Versed and 25 mg IV Demerol for adequate sedation and cough suppression.  DESCRIPTION OF PROCEDURE:  Using a standard video bronchoscope, the right naris was easily cannulated with good visualization of the entire oropharynx and larynx.  The larynx was exquisitely sensitive to topical lidocaine but otherwise unremarkable in terms of structure and function.  Using additional 1% lidocaine as needed, the entire tracheobronchial tree was explored bilaterally with the following findings.  There was diffuse evidence of airway edema and erythema with no purulent secretions identified.  However, the mucosa appeared much more friable than normal.  I did not, however,  detect any focal endobronchial abnormalities, all airways opening widely to the subsegmental area bilaterally.  Therefore, using a wedge position within the lingula, I performed selective lavage with adequate return which became slightly bloody during suctioning.  Samples were sent as follows:  Bronchial lavage for cytology, AFB, and routine stain and culture plus lavage for eosinophils.   IMPRESSION:  Await the above studies.  In the meantime, I will begin treating her empirically for sinusitis with Augmentin 875 b.i.d. and placing her on IV steroids to see if we can control airway inflammation and eliminate the inducing cough cycle that she is "stuck in" at present.  FINAL DIAGNOSIS:  Severe, chronic bronchitis, unclear etiology. Dictated by:   Charlaine Dalton. Sherene Sires, M.D. [facility] Attending Physician:  Avie Echevaria DD:  09/18/01 TD:  09/18/01 Job: [id number]
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
## 2011-05-09 LAB — COMPREHENSIVE METABOLIC PANEL
ALT: 11
AST: 23
Albumin: 3.5
Alkaline Phosphatase: 55
Calcium: 8.7
Chloride: 109
Creatinine, Ser: 0.77
GFR calc non Af Amer: >60
Glucose, Bld: 101 — ABNORMAL HIGH
Potassium: 3.9
Total Bilirubin: 0.9
Total Protein: 5.8 — ABNORMAL LOW

## 2011-05-09 LAB — DIFFERENTIAL
Eosinophils Absolute: 0.2
Monocytes Relative: 6
Neutro Abs: 4.2
Neutrophils Relative %: 63

## 2011-05-09 LAB — CBC
MCHC: 34.2
MCV: 92.2
Platelets: 220
RBC: 4.21
RDW: 12.2

## 2011-05-17 ENCOUNTER — Other Ambulatory Visit (INDEPENDENT_AMBULATORY_CARE_PROVIDER_SITE_OTHER): Payer: Medicare Other

## 2011-05-17 ENCOUNTER — Encounter: Payer: Self-pay | Admitting: Gastroenterology

## 2011-05-17 ENCOUNTER — Ambulatory Visit (INDEPENDENT_AMBULATORY_CARE_PROVIDER_SITE_OTHER): Payer: Medicare Other | Admitting: Gastroenterology

## 2011-05-17 DIAGNOSIS — R1031 Right lower quadrant pain: Secondary | ICD-10-CM

## 2011-05-17 DIAGNOSIS — R109 Unspecified abdominal pain: Secondary | ICD-10-CM

## 2011-05-17 LAB — URINALYSIS, ROUTINE W REFLEX MICROSCOPIC
Hgb urine dipstick: NEGATIVE
Ketones, ur: NEGATIVE
Specific Gravity, Urine: 1.02 (ref 1.000–1.030)
Total Protein, Urine: NEGATIVE
Urobilinogen, UA: 0.2 (ref 0.0–1.0)
pH: 7 (ref 5.0–8.0)

## 2011-05-17 LAB — BASIC METABOLIC PANEL
Calcium: 9.4 mg/dL (ref 8.4–10.5)
Glucose, Bld: 87 mg/dL (ref 70–99)
Sodium: 140 mEq/L (ref 135–145)

## 2011-05-17 MED ORDER — HYOSCYAMINE SULFATE ER 0.375 MG PO TB12: 0.3750 mg | ORAL_TABLET | Freq: Two times a day (BID) | ORAL | Status: DC | PRN

## 2011-05-17 NOTE — Patient Instructions (Signed)
  You have been scheduled for a CT scan of the abdomen and pelvis at [facility] ([address]---this is in the same building as Architectural technologist).   You are scheduled on10/16/2012 at 1pm. You should arrive 15 minutes prior to your appointment time for registration. Please follow the written instructions below on the day of your exam:  WARNING: IF YOU ARE ALLERGIC TO IODINE/X-RAY DYE, PLEASE NOTIFY RADIOLOGY IMMEDIATELY AT [phone]! YOU WILL BE GIVEN A 13 HOUR PREMEDICATION PREP.  1) Do not eat or drink anything after 9am (4 hours prior to your test) 2) You have been given 2 bottles of oral contrast to drink. The solution may taste               better if refrigerated, but do NOT add ice or any other liquid to this solution. Shake             well before drinking.    Drink 1 bottle of contrast @ 11am (2 hours prior to your exam)  Drink 1 bottle of contrast @ 12pm (1 hour prior to your exam)  You may take any medications as prescribed with a small amount of water except for the following: Metformin, Glucophage, Glucovance, Avandamet, Riomet, Fortamet, Actoplus Met, Janumet, Glumetza or Metaglip. The above medications must be held the day of the exam AND 48 hours after the exam.  The purpose of you drinking the oral contrast is to aid in the visualization of your intestinal tract. The contrast solution may cause some diarrhea. Before your exam is started, you will be given a small amount of fluid to drink. Depending on your individual set of symptoms, you may also receive an intravenous injection of x-ray contrast/dye. Plan on being at [facility] for 30 minutes or long, depending on the type of exam you are having performed.  If you have any questions regarding your exam or if you need to reschedule, you may call the CT department at [phone] between the hours of 8:00 am and 5:00 pm, Monday-Friday.  YOU WILL GO TO THE BASEMENT FOR LABS  TODAY ________________________________________________________________________

## 2011-05-17 NOTE — Progress Notes (Signed)
Denise [name] is a 65 year old white female referred at the request of Dr. Sedalia Muta for evaluation of abdominal pain. This is been an ongoing problem for over 2 years but seems to be worsening. She describes it fairly chronic aching bilateral lower abdominal pain. It is unaffected by bowel movements or urinating. She denies dysuria, melena or hematochezia.  Colonoscopy in June, 2011, performed because of this complaint, demonstrated a pedunculated polyp, hemorrhoids and diverticulosis. She recently was started on Bentyl without relief.      Past Medical History  Diagnosis Date  . Arthritis   . Constipation    Past Surgical History  Procedure Date  . Breast lumpectomy     5 times  . Cholecystectomy   . Rotator cuff repair   . Tubal ligation   . Ankle surgery   . Cataract extraction     reports that she has never smoked. She has never used smokeless tobacco. She reports that she does not drink alcohol or use illicit drugs. family history includes Bone cancer in her mother; Breast cancer in an unspecified family member; Cirrhosis in her father; and Liver disease in her father.  There is no history of Colon cancer.  Current medications and social history were reviewed in [employer] electronic medical record  Review of Systems: Pertinent positive and negative review of systems were noted in the above HPI section. All other review of systems were otherwise negative.  Vital signs were reviewed in today's medical record Physical Exam: General: Well developed , well nourished, no acute distress Head: Normocephalic and atraumatic Eyes:  sclerae anicteric, EOMI Ears: Normal auditory acuity Mouth: No deformity or lesions Neck: Supple, no masses or thyromegaly Lungs: Clear throughout to auscultation Heart: Regular rate and rhythm; no murmurs, rubs or bruits Abdomen: Soft, non tender and non distended. No masses, hepatosplenomegaly or hernias noted. Normal Bowel sounds Rectal:  Deferred Musculoskeletal: Symmetrical with no gross deformities  Skin: No lesions on visible extremities Pulses:  Normal pulses noted Extremities: No clubbing, cyanosis, edema or deformities noted Neurological: Alert oriented x 4, grossly nonfocal Cervical Nodes:  No significant cervical adenopathy Inguinal Nodes: No significant inguinal adenopathy Psychological:  Alert and cooperative. Normal mood and affect

## 2011-05-17 NOTE — Assessment & Plan Note (Addendum)
Etiology for her chronic abdominal pain is not clear. It is unlikely due to a structural abnormality of the colon.  Recommendations #1 urinalysis #2 trial of hyomax in lieu of bentyl #3 CT of the abdomen and pelvis

## 2011-06-01 ENCOUNTER — Ambulatory Visit (INDEPENDENT_AMBULATORY_CARE_PROVIDER_SITE_OTHER)
Admission: RE | Admit: 2011-06-01 | Discharge: 2011-06-01 | Disposition: A | Discharge status: Home / Self Care | Payer: Medicare Other | Source: Ambulatory Visit | Attending: Gastroenterology | Admitting: Gastroenterology

## 2011-06-01 ENCOUNTER — Telehealth: Payer: Self-pay | Admitting: Gastroenterology

## 2011-06-01 DIAGNOSIS — R109 Unspecified abdominal pain: Secondary | ICD-10-CM

## 2011-06-01 DIAGNOSIS — R197 Diarrhea, unspecified: Secondary | ICD-10-CM

## 2011-06-01 MED ORDER — IOHEXOL 300 MG/ML  SOLN
100.0000 mL | Freq: Once | INTRAMUSCULAR | Status: AC | PRN
  Administered 2011-06-01: 100 mL via INTRAVENOUS

## 2011-06-01 NOTE — Telephone Encounter (Signed)
CT scan is unremarkable. If she improves with hyomax? If so no further workup is required.

## 2011-06-01 NOTE — Telephone Encounter (Signed)
Diarrhea has been going on for several weeks per pt. Pt will come by the lab for the stool studies. Pt states she had a GYN exam in late Jan or early Feb.

## 2011-06-01 NOTE — Telephone Encounter (Signed)
Pt is calling for CT scan results. Please advise.

## 2011-06-01 NOTE — Telephone Encounter (Signed)
Pt aware. Pt states that the hyomax had not helped. She reports that the diarrhea has gotten worse and the pain in her lower abdomen is almost constant. Dr. Arlyce Dice please advise.

## 2011-06-01 NOTE — Telephone Encounter (Signed)
His diarrhea a new problem? If she's having diarrhea she needs a stool lactoferrin, CNS, O&P and C. difficile toxin.  Has she had a GYN examination in the last year?

## 2011-06-02 NOTE — Telephone Encounter (Signed)
ok 

## 2011-06-05 ENCOUNTER — Other Ambulatory Visit: Payer: Medicare Other

## 2011-06-05 DIAGNOSIS — R197 Diarrhea, unspecified: Secondary | ICD-10-CM

## 2011-06-06 LAB — FECAL LACTOFERRIN, QUANT: Lactoferrin: NEGATIVE

## 2011-06-06 LAB — OVA AND PARASITE EXAMINATION: OP: NONE SEEN

## 2011-06-07 LAB — CLOSTRIDIUM DIFFICILE BY PCR: Toxigenic C. Difficile by PCR: NOT DETECTED

## 2011-06-09 ENCOUNTER — Telehealth: Payer: Self-pay | Admitting: Gastroenterology

## 2011-06-09 NOTE — Telephone Encounter (Signed)
Spoke with pt and she wanted to know the results from her stool samples. All results were negative, pt aware. Pt state she has not had diarrhea or abdominal pain for the past 2 days. States she is not taking the hyomax because it dried her throat out so bad she could not stop coughing. Pt instructed to call us back is she started having the abdominal pain or diarrhea again. Pt aware.

## 2011-08-10 DIAGNOSIS — M654 Radial styloid tenosynovitis [de Quervain]: Secondary | ICD-10-CM | POA: Diagnosis not present

## 2011-08-10 DIAGNOSIS — M25519 Pain in unspecified shoulder: Secondary | ICD-10-CM | POA: Diagnosis not present

## 2011-09-18 DIAGNOSIS — Z1212 Encounter for screening for malignant neoplasm of rectum: Secondary | ICD-10-CM | POA: Diagnosis not present

## 2011-09-18 DIAGNOSIS — Z1231 Encounter for screening mammogram for malignant neoplasm of breast: Secondary | ICD-10-CM | POA: Diagnosis not present

## 2011-09-18 DIAGNOSIS — N951 Menopausal and female climacteric states: Secondary | ICD-10-CM | POA: Diagnosis not present

## 2011-09-25 ENCOUNTER — Other Ambulatory Visit: Payer: Self-pay | Admitting: Gynecology

## 2011-09-25 DIAGNOSIS — R928 Other abnormal and inconclusive findings on diagnostic imaging of breast: Secondary | ICD-10-CM

## 2011-09-27 ENCOUNTER — Ambulatory Visit
Admission: RE | Admit: 2011-09-27 | Discharge: 2011-09-27 | Disposition: A | Discharge status: Home / Self Care | Payer: Medicare Other | Source: Ambulatory Visit | Attending: Gynecology | Admitting: Gynecology

## 2011-09-27 DIAGNOSIS — R928 Other abnormal and inconclusive findings on diagnostic imaging of breast: Secondary | ICD-10-CM

## 2011-10-31 DIAGNOSIS — R51 Headache: Secondary | ICD-10-CM | POA: Diagnosis not present

## 2011-10-31 DIAGNOSIS — H524 Presbyopia: Secondary | ICD-10-CM | POA: Diagnosis not present

## 2011-12-15 DIAGNOSIS — M654 Radial styloid tenosynovitis [de Quervain]: Secondary | ICD-10-CM | POA: Diagnosis not present

## 2012-02-21 DIAGNOSIS — R072 Precordial pain: Secondary | ICD-10-CM | POA: Diagnosis not present

## 2012-02-21 DIAGNOSIS — R0602 Shortness of breath: Secondary | ICD-10-CM | POA: Diagnosis not present

## 2012-02-21 DIAGNOSIS — R079 Chest pain, unspecified: Secondary | ICD-10-CM | POA: Diagnosis not present

## 2012-02-28 DIAGNOSIS — L820 Inflamed seborrheic keratosis: Secondary | ICD-10-CM | POA: Diagnosis not present

## 2012-03-01 DIAGNOSIS — R079 Chest pain, unspecified: Secondary | ICD-10-CM | POA: Diagnosis not present

## 2012-03-01 DIAGNOSIS — R0789 Other chest pain: Secondary | ICD-10-CM | POA: Diagnosis not present

## 2012-03-20 DIAGNOSIS — H905 Unspecified sensorineural hearing loss: Secondary | ICD-10-CM | POA: Diagnosis not present

## 2012-03-20 DIAGNOSIS — H9319 Tinnitus, unspecified ear: Secondary | ICD-10-CM | POA: Diagnosis not present

## 2012-03-20 DIAGNOSIS — H903 Sensorineural hearing loss, bilateral: Secondary | ICD-10-CM | POA: Diagnosis not present

## 2012-04-03 DIAGNOSIS — R072 Precordial pain: Secondary | ICD-10-CM | POA: Diagnosis not present

## 2012-04-03 DIAGNOSIS — H9319 Tinnitus, unspecified ear: Secondary | ICD-10-CM | POA: Diagnosis not present

## 2012-04-18 DIAGNOSIS — Z23 Encounter for immunization: Secondary | ICD-10-CM | POA: Diagnosis not present

## 2012-06-29 DIAGNOSIS — K5289 Other specified noninfective gastroenteritis and colitis: Secondary | ICD-10-CM | POA: Diagnosis present

## 2012-06-29 DIAGNOSIS — E876 Hypokalemia: Secondary | ICD-10-CM | POA: Diagnosis not present

## 2012-06-29 DIAGNOSIS — K7689 Other specified diseases of liver: Secondary | ICD-10-CM | POA: Diagnosis not present

## 2012-06-29 DIAGNOSIS — A088 Other specified intestinal infections: Secondary | ICD-10-CM | POA: Diagnosis not present

## 2012-06-29 DIAGNOSIS — E860 Dehydration: Secondary | ICD-10-CM | POA: Diagnosis not present

## 2012-06-29 DIAGNOSIS — N289 Disorder of kidney and ureter, unspecified: Secondary | ICD-10-CM | POA: Diagnosis not present

## 2012-06-29 DIAGNOSIS — J9819 Other pulmonary collapse: Secondary | ICD-10-CM | POA: Diagnosis present

## 2012-06-29 DIAGNOSIS — R109 Unspecified abdominal pain: Secondary | ICD-10-CM | POA: Diagnosis present

## 2012-06-29 DIAGNOSIS — R6889 Other general symptoms and signs: Secondary | ICD-10-CM | POA: Diagnosis not present

## 2012-06-29 DIAGNOSIS — R0602 Shortness of breath: Secondary | ICD-10-CM | POA: Diagnosis not present

## 2012-06-29 DIAGNOSIS — R1013 Epigastric pain: Secondary | ICD-10-CM | POA: Diagnosis not present

## 2012-06-29 DIAGNOSIS — K3189 Other diseases of stomach and duodenum: Secondary | ICD-10-CM | POA: Diagnosis present

## 2012-06-29 DIAGNOSIS — R079 Chest pain, unspecified: Secondary | ICD-10-CM | POA: Diagnosis not present

## 2012-06-29 DIAGNOSIS — R1031 Right lower quadrant pain: Secondary | ICD-10-CM | POA: Diagnosis not present

## 2012-06-30 DIAGNOSIS — R079 Chest pain, unspecified: Secondary | ICD-10-CM | POA: Diagnosis not present

## 2012-06-30 DIAGNOSIS — A088 Other specified intestinal infections: Secondary | ICD-10-CM | POA: Diagnosis not present

## 2012-06-30 DIAGNOSIS — E876 Hypokalemia: Secondary | ICD-10-CM | POA: Diagnosis not present

## 2012-06-30 DIAGNOSIS — R1031 Right lower quadrant pain: Secondary | ICD-10-CM | POA: Diagnosis not present

## 2012-08-29 DIAGNOSIS — R0789 Other chest pain: Secondary | ICD-10-CM | POA: Diagnosis not present

## 2012-08-29 DIAGNOSIS — J45909 Unspecified asthma, uncomplicated: Secondary | ICD-10-CM | POA: Diagnosis not present

## 2012-08-29 DIAGNOSIS — R109 Unspecified abdominal pain: Secondary | ICD-10-CM | POA: Diagnosis not present

## 2012-09-24 DIAGNOSIS — R0602 Shortness of breath: Secondary | ICD-10-CM | POA: Diagnosis not present

## 2012-09-26 DIAGNOSIS — Z1231 Encounter for screening mammogram for malignant neoplasm of breast: Secondary | ICD-10-CM | POA: Diagnosis not present

## 2012-09-26 DIAGNOSIS — Z1289 Encounter for screening for malignant neoplasm of other sites: Secondary | ICD-10-CM | POA: Diagnosis not present

## 2012-09-26 DIAGNOSIS — Z1212 Encounter for screening for malignant neoplasm of rectum: Secondary | ICD-10-CM | POA: Diagnosis not present

## 2012-10-21 DIAGNOSIS — M5126 Other intervertebral disc displacement, lumbar region: Secondary | ICD-10-CM | POA: Diagnosis not present

## 2012-10-21 DIAGNOSIS — M999 Biomechanical lesion, unspecified: Secondary | ICD-10-CM | POA: Diagnosis not present

## 2012-10-23 DIAGNOSIS — M999 Biomechanical lesion, unspecified: Secondary | ICD-10-CM | POA: Diagnosis not present

## 2012-10-23 DIAGNOSIS — M5126 Other intervertebral disc displacement, lumbar region: Secondary | ICD-10-CM | POA: Diagnosis not present

## 2012-10-25 DIAGNOSIS — M5126 Other intervertebral disc displacement, lumbar region: Secondary | ICD-10-CM | POA: Diagnosis not present

## 2012-10-25 DIAGNOSIS — M999 Biomechanical lesion, unspecified: Secondary | ICD-10-CM | POA: Diagnosis not present

## 2012-11-05 DIAGNOSIS — H524 Presbyopia: Secondary | ICD-10-CM | POA: Diagnosis not present

## 2012-11-05 DIAGNOSIS — H26499 Other secondary cataract, unspecified eye: Secondary | ICD-10-CM | POA: Diagnosis not present

## 2012-12-09 DIAGNOSIS — M654 Radial styloid tenosynovitis [de Quervain]: Secondary | ICD-10-CM | POA: Diagnosis not present

## 2012-12-16 ENCOUNTER — Encounter: Payer: Self-pay | Admitting: Gastroenterology

## 2012-12-26 DIAGNOSIS — M654 Radial styloid tenosynovitis [de Quervain]: Secondary | ICD-10-CM | POA: Diagnosis not present

## 2012-12-26 DIAGNOSIS — M19049 Primary osteoarthritis, unspecified hand: Secondary | ICD-10-CM | POA: Diagnosis not present

## 2013-01-20 DIAGNOSIS — M654 Radial styloid tenosynovitis [de Quervain]: Secondary | ICD-10-CM | POA: Diagnosis not present

## 2013-01-20 DIAGNOSIS — M19049 Primary osteoarthritis, unspecified hand: Secondary | ICD-10-CM | POA: Diagnosis not present

## 2013-04-14 DIAGNOSIS — Z23 Encounter for immunization: Secondary | ICD-10-CM | POA: Diagnosis not present

## 2013-04-22 DIAGNOSIS — L820 Inflamed seborrheic keratosis: Secondary | ICD-10-CM | POA: Diagnosis not present

## 2013-05-20 DIAGNOSIS — M5126 Other intervertebral disc displacement, lumbar region: Secondary | ICD-10-CM | POA: Diagnosis not present

## 2013-05-20 DIAGNOSIS — M999 Biomechanical lesion, unspecified: Secondary | ICD-10-CM | POA: Diagnosis not present

## 2013-05-26 DIAGNOSIS — M19049 Primary osteoarthritis, unspecified hand: Secondary | ICD-10-CM | POA: Diagnosis not present

## 2013-07-21 DIAGNOSIS — Z79899 Other long term (current) drug therapy: Secondary | ICD-10-CM | POA: Diagnosis not present

## 2013-08-21 DIAGNOSIS — L820 Inflamed seborrheic keratosis: Secondary | ICD-10-CM | POA: Diagnosis not present

## 2013-09-01 DIAGNOSIS — M19049 Primary osteoarthritis, unspecified hand: Secondary | ICD-10-CM | POA: Diagnosis not present

## 2013-09-01 DIAGNOSIS — M76829 Posterior tibial tendinitis, unspecified leg: Secondary | ICD-10-CM | POA: Diagnosis not present

## 2013-10-31 DIAGNOSIS — Z111 Encounter for screening for respiratory tuberculosis: Secondary | ICD-10-CM | POA: Diagnosis not present

## 2013-11-06 DIAGNOSIS — Z961 Presence of intraocular lens: Secondary | ICD-10-CM | POA: Diagnosis not present

## 2013-11-06 DIAGNOSIS — H524 Presbyopia: Secondary | ICD-10-CM | POA: Diagnosis not present

## 2013-12-12 ENCOUNTER — Other Ambulatory Visit: Payer: Self-pay | Admitting: Obstetrics and Gynecology

## 2013-12-12 DIAGNOSIS — Z1231 Encounter for screening mammogram for malignant neoplasm of breast: Secondary | ICD-10-CM | POA: Diagnosis not present

## 2013-12-12 DIAGNOSIS — N760 Acute vaginitis: Secondary | ICD-10-CM | POA: Diagnosis not present

## 2013-12-12 DIAGNOSIS — N952 Postmenopausal atrophic vaginitis: Secondary | ICD-10-CM | POA: Diagnosis not present

## 2013-12-15 DIAGNOSIS — M899 Disorder of bone, unspecified: Secondary | ICD-10-CM | POA: Diagnosis not present

## 2014-03-05 ENCOUNTER — Encounter: Payer: Self-pay | Admitting: Gastroenterology

## 2014-03-17 DIAGNOSIS — M5137 Other intervertebral disc degeneration, lumbosacral region: Secondary | ICD-10-CM | POA: Diagnosis not present

## 2014-03-26 DIAGNOSIS — M545 Low back pain, unspecified: Secondary | ICD-10-CM | POA: Diagnosis not present

## 2014-04-03 DIAGNOSIS — M5137 Other intervertebral disc degeneration, lumbosacral region: Secondary | ICD-10-CM | POA: Diagnosis not present

## 2014-04-07 ENCOUNTER — Other Ambulatory Visit: Payer: Self-pay | Admitting: Orthopedic Surgery

## 2014-04-07 DIAGNOSIS — G8929 Other chronic pain: Secondary | ICD-10-CM

## 2014-04-07 DIAGNOSIS — M545 Low back pain, unspecified: Secondary | ICD-10-CM

## 2014-04-07 DIAGNOSIS — M7918 Myalgia, other site: Secondary | ICD-10-CM

## 2014-04-09 ENCOUNTER — Inpatient Hospital Stay
Admission: RE | Admit: 2014-04-09 | Discharge: 2014-04-09 | Disposition: A | Discharge status: Home / Self Care | Payer: Self-pay | Source: Ambulatory Visit | Attending: Orthopedic Surgery | Admitting: Orthopedic Surgery

## 2014-04-09 ENCOUNTER — Other Ambulatory Visit: Payer: Self-pay | Admitting: Orthopedic Surgery

## 2014-04-09 ENCOUNTER — Ambulatory Visit
Admission: RE | Admit: 2014-04-09 | Discharge: 2014-04-09 | Disposition: A | Discharge status: Home / Self Care | Payer: Medicare Other | Source: Ambulatory Visit | Attending: Orthopedic Surgery | Admitting: Orthopedic Surgery

## 2014-04-09 VITALS — BP 129/68 | HR 69

## 2014-04-09 DIAGNOSIS — M545 Low back pain, unspecified: Secondary | ICD-10-CM

## 2014-04-09 DIAGNOSIS — G8929 Other chronic pain: Secondary | ICD-10-CM

## 2014-04-09 DIAGNOSIS — M7918 Myalgia, other site: Secondary | ICD-10-CM

## 2014-04-09 DIAGNOSIS — M48061 Spinal stenosis, lumbar region without neurogenic claudication: Secondary | ICD-10-CM | POA: Diagnosis not present

## 2014-04-09 DIAGNOSIS — R52 Pain, unspecified: Secondary | ICD-10-CM

## 2014-04-09 DIAGNOSIS — M5137 Other intervertebral disc degeneration, lumbosacral region: Secondary | ICD-10-CM | POA: Diagnosis not present

## 2014-04-09 MED ORDER — MEPERIDINE HCL 100 MG/ML IJ SOLN
75.0000 mg | Freq: Once | INTRAMUSCULAR | Status: AC
  Administered 2014-04-09: 75 mg via INTRAMUSCULAR

## 2014-04-09 MED ORDER — ONDANSETRON HCL 4 MG/2ML IJ SOLN
4.0000 mg | Freq: Once | INTRAMUSCULAR | Status: AC
  Administered 2014-04-09: 4 mg via INTRAMUSCULAR

## 2014-04-09 MED ORDER — IOHEXOL 180 MG/ML  SOLN
15.0000 mL | Freq: Once | INTRAMUSCULAR | Status: AC | PRN
  Administered 2014-04-09: 15 mL via INTRATHECAL

## 2014-04-09 MED ORDER — DIAZEPAM 5 MG PO TABS
5.0000 mg | ORAL_TABLET | Freq: Once | ORAL | Status: AC
  Administered 2014-04-09: 5 mg via ORAL

## 2014-04-09 NOTE — Discharge Instructions (Signed)

## 2014-04-14 DIAGNOSIS — M5126 Other intervertebral disc displacement, lumbar region: Secondary | ICD-10-CM | POA: Diagnosis not present

## 2014-04-16 DIAGNOSIS — Z23 Encounter for immunization: Secondary | ICD-10-CM | POA: Diagnosis not present

## 2014-04-21 DIAGNOSIS — M545 Low back pain, unspecified: Secondary | ICD-10-CM | POA: Diagnosis not present

## 2014-05-05 ENCOUNTER — Other Ambulatory Visit: Payer: Self-pay | Admitting: Orthopedic Surgery

## 2014-05-05 DIAGNOSIS — M5126 Other intervertebral disc displacement, lumbar region: Secondary | ICD-10-CM | POA: Diagnosis not present

## 2014-05-05 DIAGNOSIS — Q619 Cystic kidney disease, unspecified: Secondary | ICD-10-CM

## 2014-05-05 DIAGNOSIS — M5137 Other intervertebral disc degeneration, lumbosacral region: Secondary | ICD-10-CM | POA: Diagnosis not present

## 2014-05-07 DIAGNOSIS — H9313 Tinnitus, bilateral: Secondary | ICD-10-CM | POA: Diagnosis not present

## 2014-05-07 DIAGNOSIS — H9193 Unspecified hearing loss, bilateral: Secondary | ICD-10-CM | POA: Diagnosis not present

## 2014-05-07 DIAGNOSIS — H903 Sensorineural hearing loss, bilateral: Secondary | ICD-10-CM | POA: Diagnosis not present

## 2014-05-07 DIAGNOSIS — H8103 Meniere's disease, bilateral: Secondary | ICD-10-CM | POA: Diagnosis not present

## 2014-05-11 ENCOUNTER — Ambulatory Visit
Admission: RE | Admit: 2014-05-11 | Discharge: 2014-05-11 | Disposition: A | Discharge status: Home / Self Care | Payer: Medicare Other | Source: Ambulatory Visit | Attending: Orthopedic Surgery | Admitting: Orthopedic Surgery

## 2014-05-11 DIAGNOSIS — Q6100 Congenital renal cyst, unspecified: Secondary | ICD-10-CM | POA: Diagnosis not present

## 2014-05-11 DIAGNOSIS — Q619 Cystic kidney disease, unspecified: Secondary | ICD-10-CM

## 2014-05-11 DIAGNOSIS — N2889 Other specified disorders of kidney and ureter: Secondary | ICD-10-CM | POA: Diagnosis not present

## 2014-05-21 DIAGNOSIS — H903 Sensorineural hearing loss, bilateral: Secondary | ICD-10-CM | POA: Diagnosis not present

## 2014-05-21 DIAGNOSIS — H9313 Tinnitus, bilateral: Secondary | ICD-10-CM | POA: Diagnosis not present

## 2014-05-21 DIAGNOSIS — H70003 Acute mastoiditis without complications, bilateral: Secondary | ICD-10-CM | POA: Diagnosis not present

## 2014-06-18 DIAGNOSIS — N281 Cyst of kidney, acquired: Secondary | ICD-10-CM | POA: Diagnosis not present

## 2014-09-29 DIAGNOSIS — Z79899 Other long term (current) drug therapy: Secondary | ICD-10-CM | POA: Diagnosis not present

## 2014-10-05 DIAGNOSIS — H9313 Tinnitus, bilateral: Secondary | ICD-10-CM | POA: Diagnosis not present

## 2014-10-26 DIAGNOSIS — M9901 Segmental and somatic dysfunction of cervical region: Secondary | ICD-10-CM | POA: Diagnosis not present

## 2014-10-26 DIAGNOSIS — M5414 Radiculopathy, thoracic region: Secondary | ICD-10-CM | POA: Diagnosis not present

## 2014-10-26 DIAGNOSIS — M9902 Segmental and somatic dysfunction of thoracic region: Secondary | ICD-10-CM | POA: Diagnosis not present

## 2014-10-26 DIAGNOSIS — M531 Cervicobrachial syndrome: Secondary | ICD-10-CM | POA: Diagnosis not present

## 2014-10-27 DIAGNOSIS — M9901 Segmental and somatic dysfunction of cervical region: Secondary | ICD-10-CM | POA: Diagnosis not present

## 2014-10-27 DIAGNOSIS — M531 Cervicobrachial syndrome: Secondary | ICD-10-CM | POA: Diagnosis not present

## 2014-10-27 DIAGNOSIS — M5414 Radiculopathy, thoracic region: Secondary | ICD-10-CM | POA: Diagnosis not present

## 2014-10-27 DIAGNOSIS — M9902 Segmental and somatic dysfunction of thoracic region: Secondary | ICD-10-CM | POA: Diagnosis not present

## 2014-10-28 DIAGNOSIS — M531 Cervicobrachial syndrome: Secondary | ICD-10-CM | POA: Diagnosis not present

## 2014-10-28 DIAGNOSIS — M9902 Segmental and somatic dysfunction of thoracic region: Secondary | ICD-10-CM | POA: Diagnosis not present

## 2014-10-28 DIAGNOSIS — M5414 Radiculopathy, thoracic region: Secondary | ICD-10-CM | POA: Diagnosis not present

## 2014-10-28 DIAGNOSIS — M9901 Segmental and somatic dysfunction of cervical region: Secondary | ICD-10-CM | POA: Diagnosis not present

## 2014-10-29 DIAGNOSIS — M9901 Segmental and somatic dysfunction of cervical region: Secondary | ICD-10-CM | POA: Diagnosis not present

## 2014-10-29 DIAGNOSIS — M9902 Segmental and somatic dysfunction of thoracic region: Secondary | ICD-10-CM | POA: Diagnosis not present

## 2014-10-29 DIAGNOSIS — M5414 Radiculopathy, thoracic region: Secondary | ICD-10-CM | POA: Diagnosis not present

## 2014-10-29 DIAGNOSIS — M531 Cervicobrachial syndrome: Secondary | ICD-10-CM | POA: Diagnosis not present

## 2014-11-02 DIAGNOSIS — M5414 Radiculopathy, thoracic region: Secondary | ICD-10-CM | POA: Diagnosis not present

## 2014-11-02 DIAGNOSIS — M9901 Segmental and somatic dysfunction of cervical region: Secondary | ICD-10-CM | POA: Diagnosis not present

## 2014-11-02 DIAGNOSIS — M9902 Segmental and somatic dysfunction of thoracic region: Secondary | ICD-10-CM | POA: Diagnosis not present

## 2014-11-02 DIAGNOSIS — M531 Cervicobrachial syndrome: Secondary | ICD-10-CM | POA: Diagnosis not present

## 2014-11-03 DIAGNOSIS — M531 Cervicobrachial syndrome: Secondary | ICD-10-CM | POA: Diagnosis not present

## 2014-11-03 DIAGNOSIS — M9901 Segmental and somatic dysfunction of cervical region: Secondary | ICD-10-CM | POA: Diagnosis not present

## 2014-11-03 DIAGNOSIS — M5414 Radiculopathy, thoracic region: Secondary | ICD-10-CM | POA: Diagnosis not present

## 2014-11-03 DIAGNOSIS — M9902 Segmental and somatic dysfunction of thoracic region: Secondary | ICD-10-CM | POA: Diagnosis not present

## 2014-11-11 DIAGNOSIS — M9901 Segmental and somatic dysfunction of cervical region: Secondary | ICD-10-CM | POA: Diagnosis not present

## 2014-11-11 DIAGNOSIS — M5414 Radiculopathy, thoracic region: Secondary | ICD-10-CM | POA: Diagnosis not present

## 2014-11-11 DIAGNOSIS — M9902 Segmental and somatic dysfunction of thoracic region: Secondary | ICD-10-CM | POA: Diagnosis not present

## 2014-11-11 DIAGNOSIS — M531 Cervicobrachial syndrome: Secondary | ICD-10-CM | POA: Diagnosis not present

## 2014-11-17 DIAGNOSIS — H26491 Other secondary cataract, right eye: Secondary | ICD-10-CM | POA: Diagnosis not present

## 2015-01-06 DIAGNOSIS — Z1231 Encounter for screening mammogram for malignant neoplasm of breast: Secondary | ICD-10-CM | POA: Diagnosis not present

## 2015-01-06 DIAGNOSIS — N952 Postmenopausal atrophic vaginitis: Secondary | ICD-10-CM | POA: Diagnosis not present

## 2015-01-08 DIAGNOSIS — R05 Cough: Secondary | ICD-10-CM | POA: Diagnosis not present

## 2015-01-18 DIAGNOSIS — M778 Other enthesopathies, not elsewhere classified: Secondary | ICD-10-CM | POA: Diagnosis not present

## 2015-01-18 DIAGNOSIS — M5137 Other intervertebral disc degeneration, lumbosacral region: Secondary | ICD-10-CM | POA: Diagnosis not present

## 2015-01-25 DIAGNOSIS — R918 Other nonspecific abnormal finding of lung field: Secondary | ICD-10-CM | POA: Diagnosis not present

## 2015-01-25 DIAGNOSIS — R05 Cough: Secondary | ICD-10-CM | POA: Diagnosis not present

## 2015-01-25 DIAGNOSIS — J9811 Atelectasis: Secondary | ICD-10-CM | POA: Diagnosis not present

## 2015-01-25 DIAGNOSIS — R0602 Shortness of breath: Secondary | ICD-10-CM | POA: Diagnosis not present

## 2015-02-04 DIAGNOSIS — R0602 Shortness of breath: Secondary | ICD-10-CM | POA: Diagnosis not present

## 2015-02-04 DIAGNOSIS — J189 Pneumonia, unspecified organism: Secondary | ICD-10-CM | POA: Diagnosis not present

## 2015-02-04 DIAGNOSIS — R05 Cough: Secondary | ICD-10-CM | POA: Diagnosis not present

## 2015-02-11 DIAGNOSIS — M778 Other enthesopathies, not elsewhere classified: Secondary | ICD-10-CM | POA: Diagnosis not present

## 2015-02-11 DIAGNOSIS — M5137 Other intervertebral disc degeneration, lumbosacral region: Secondary | ICD-10-CM | POA: Diagnosis not present

## 2015-02-22 DIAGNOSIS — J189 Pneumonia, unspecified organism: Secondary | ICD-10-CM | POA: Diagnosis not present

## 2015-02-22 DIAGNOSIS — R05 Cough: Secondary | ICD-10-CM | POA: Diagnosis not present

## 2015-03-12 DIAGNOSIS — Z111 Encounter for screening for respiratory tuberculosis: Secondary | ICD-10-CM | POA: Diagnosis not present

## 2015-03-16 DIAGNOSIS — R072 Precordial pain: Secondary | ICD-10-CM | POA: Diagnosis not present

## 2015-03-16 DIAGNOSIS — J449 Chronic obstructive pulmonary disease, unspecified: Secondary | ICD-10-CM | POA: Diagnosis not present

## 2015-03-16 DIAGNOSIS — R0602 Shortness of breath: Secondary | ICD-10-CM | POA: Diagnosis not present

## 2015-03-16 DIAGNOSIS — Z23 Encounter for immunization: Secondary | ICD-10-CM | POA: Diagnosis not present

## 2015-04-08 ENCOUNTER — Encounter: Payer: Self-pay | Admitting: Emergency Medicine

## 2015-04-08 ENCOUNTER — Ambulatory Visit (INDEPENDENT_AMBULATORY_CARE_PROVIDER_SITE_OTHER): Payer: Medicare Other | Admitting: Emergency Medicine

## 2015-04-08 VITALS — BP 142/96 | HR 81 | Ht 62.0 in | Wt 169.0 lb

## 2015-04-08 DIAGNOSIS — R0789 Other chest pain: Secondary | ICD-10-CM | POA: Diagnosis not present

## 2015-04-08 DIAGNOSIS — R05 Cough: Secondary | ICD-10-CM | POA: Diagnosis not present

## 2015-04-08 DIAGNOSIS — R0600 Dyspnea, unspecified: Secondary | ICD-10-CM | POA: Insufficient documentation

## 2015-04-08 DIAGNOSIS — R053 Chronic cough: Secondary | ICD-10-CM

## 2015-04-08 DIAGNOSIS — R0602 Shortness of breath: Secondary | ICD-10-CM | POA: Insufficient documentation

## 2015-04-08 MED ORDER — OMEPRAZOLE 20 MG PO CPDR: 20.0000 mg | DELAYED_RELEASE_CAPSULE | Freq: Every day | ORAL | Status: DC

## 2015-04-08 NOTE — Assessment & Plan Note (Signed)
See sections on chronic cough and dyspnea

## 2015-04-08 NOTE — Assessment & Plan Note (Signed)
Certainly we need to rule out possible obstructive lung disease. Her CT scan of the chest was reassuring. Please see other discussion and the chronic cough section. Certainly be some significant overlap between her chest tightness, dyspnea and her cough.

## 2015-04-08 NOTE — Progress Notes (Signed)
Subjective:    Patient ID: Denise [name], female    DOB: [birth year], 69 y.o.   MRN: [id number]  HPI 69 year old never smoker with a history of arthritis, benign breast tumors. She has a remote hx of cough and has been treated for asthma in 2003 after an exposure to roofing tar. Subsequent w/u showed no asthma (? PFT). She has had chronic cough to a lesser degree since. She has required cough syrup on many occasions. She has also had intermittent dyspnea. This Summer she had another significant coughing flare and CXR's were done that suggested PNA, was treated with abx. Also received pred at one point this Summer. She was told that her CXR did improve. She has also been dealing with R hand and joint inflammation and she was given pred for that. No other joints seem to be affected. Still having dyspnea although slightly improved, worst when supine. The cough is a bit better but can flare.  She is having CP and pressure right now even at rest.   She underwent chest x-rays at [facility] in June that showed a possible left lower lobe infiltrate. I have reviewed also a CT scan of the chest performed 03/16/15 that showed no evidence of pulmonary embolism or parenchymal abnormality. There is no evidence of pneumonia or scarring. There may be some very slight bilateral lower lobe atelectasis.     Review of Systems  Constitutional: Negative for fever and unexpected weight change.  HENT: Negative for congestion, dental problem, ear pain, nosebleeds, postnasal drip, rhinorrhea, sinus pressure, sneezing, sore throat and trouble swallowing.   Eyes: Negative for redness and itching.  Respiratory: Positive for cough and shortness of breath. Negative for chest tightness and wheezing.   Cardiovascular: Positive for chest pain. Negative for palpitations and leg swelling.  Gastrointestinal: Negative for nausea and vomiting.  Genitourinary: Negative for dysuria.  Musculoskeletal: Positive for arthralgias.  Negative for joint swelling.  Skin: Negative for rash.  Neurological: Negative for headaches.  Hematological: Does not bruise/bleed easily.  Psychiatric/Behavioral: Negative for dysphoric mood. The patient is not nervous/anxious.     Past Medical History  Diagnosis Date  . Arthritis   . Constipation      Family History  Problem Relation Age of Onset  . Breast cancer      aunts  . Liver disease Father   . Bone cancer Mother   . Cirrhosis Father   . Colon cancer Neg Hx      Social History   Social History  . Marital Status: Married    Spouse Name: N/A  . Number of Children: N/A  . Years of Education: N/A   Occupational History  . Self Employed    Social History Main Topics  . Smoking status: Never Smoker   . Smokeless tobacco: Never Used  . Alcohol Use: No  . Drug Use: No  . Sexual Activity: Not on file   Other Topics Concern  . Not on file   Social History Narrative     No Known Allergies   Outpatient Prescriptions Prior to Visit  Medication Sig Dispense Refill  . Ascorbic Acid (VITAMIN C CR) 500 MG TBCR Take by mouth 1 day or 1 dose.      . Calcium Carbonate (CALTRATE 600) 1500 MG TABS Take by mouth 1 day or 1 dose.      . Fiber CAPS Take 1 capsule by mouth 2 (two) times daily.      . Multiple Vitamin (MULTIVITAMIN) tablet  Take 1 tablet by mouth daily.      . celecoxib (CELEBREX) 200 MG capsule Take 200 mg by mouth daily.     Marland Kitchen dicyclomine (BENTYL) 10 MG capsule Take 10 mg by mouth daily.     . hyoscyamine (HYOMAX-SR) 0.375 MG 12 hr tablet Take 1 tablet (0.375 mg total) by mouth every 12 (twelve) hours as needed for cramping. 60 tablet 1  . Probiotic Product (ALIGN PO) Take 1 capsule by mouth daily.      . raloxifene (EVISTA) 60 MG tablet Take 60 mg by mouth daily.       No facility-administered medications prior to visit.         Objective:   Physical Exam Filed Vitals:   04/08/15 1539  BP: 142/96  Pulse: 81  Height: 5\' 2"  (1.575 m)  Weight:  169 lb (76.658 kg)  SpO2: 98%   Gen: Pleasant, well-nourished, in no distress,  normal affect, slightly anxious  ENT: No lesions,  mouth clear,  oropharynx clear, no postnasal drip  Neck: No JVD, no TMG, no carotid bruits  Lungs: No use of accessory muscles, clear without rales or rhonchi  Cardiovascular: RRR, heart sounds normal, no murmur or gallops, no peripheral edema  Musculoskeletal: No deformities, no cyanosis or clubbing  Neuro: alert, non focal  Skin: Warm, no lesions or rashes      Assessment & Plan:  Chronic cough This appears to be her most long-standing problem. It has been associated on more than one occasion with flares of dyspnea and chest tightness. No clear diagnosis of asthma has been made to date. she was treated for possible pneumonia although it's unclear whether this was a true diagnosis - her chest x-ray did question a left lower lobe infiltrate versus atelectasis. I'm suspicious based on the constellation of symptoms that she does have airflow obstruction and intermittent asthma. I'd like to prove this with pulmonary function testing and consider bronchodilators if indicated. In the meantime I think would be reasonable to empirically treat the common causes such as GERD and chronic rhinitis. Ice plane this to her and we will plan to start empiric omeprazole, loratadine, chlorpheniramine.   Dyspnea Certainly we need to rule out possible obstructive lung disease. Her CT scan of the chest was reassuring. Please see other discussion and the chronic cough section. Certainly be some significant overlap between her chest tightness, dyspnea and her cough.   Chest discomfort See sections on chronic cough and dyspnea

## 2015-04-08 NOTE — Patient Instructions (Addendum)
Please start omeprazole 20 mg twice a day for 2 weeks and then decrease to once a day until her follow-up visit Start Zyrtec 10 mg daily Try using chlorpheniramine 4mg  up to every 6 hours if needed for cough or congestion We will perform full pulmonary function testing Follow with Dr Lamonte Sakai next available

## 2015-04-08 NOTE — Assessment & Plan Note (Signed)
This appears to be her most long-standing problem. It has been associated on more than one occasion with flares of dyspnea and chest tightness. No clear diagnosis of asthma has been made to date. she was treated for possible pneumonia although it's unclear whether this was a true diagnosis - her chest x-ray did question a left lower lobe infiltrate versus atelectasis. I'm suspicious based on the constellation of symptoms that she does have airflow obstruction and intermittent asthma. I'd like to prove this with pulmonary function testing and consider bronchodilators if indicated. In the meantime I think would be reasonable to empirically treat the common causes such as GERD and chronic rhinitis. Ice plane this to her and we will plan to start empiric omeprazole, loratadine, chlorpheniramine.

## 2015-04-12 ENCOUNTER — Encounter: Payer: Self-pay | Admitting: Emergency Medicine

## 2015-04-13 NOTE — Telephone Encounter (Signed)
MyChart message:  I am taking the medications that you prescribed at my visit dated 04/08/2015. I just wonder if it's too much because I am experiencing stomach pain and an increase in lethargy when I get up in the morning! Also, I'm zonked out by 8:30 pm and can't keep my eyes open! I have had problems with sleeping before the medications but this is totally unusual. Before the added medications, I could stay awake up until 11:00 or 12:00.   I am going to [place] on Thursday and would like to know what to do about the medications before the trip.

## 2015-04-22 DIAGNOSIS — L03011 Cellulitis of right finger: Secondary | ICD-10-CM | POA: Diagnosis not present

## 2015-04-22 DIAGNOSIS — L0390 Cellulitis, unspecified: Secondary | ICD-10-CM | POA: Diagnosis not present

## 2015-04-23 DIAGNOSIS — Z23 Encounter for immunization: Secondary | ICD-10-CM | POA: Diagnosis not present

## 2015-04-28 DIAGNOSIS — M25512 Pain in left shoulder: Secondary | ICD-10-CM | POA: Diagnosis not present

## 2015-04-28 DIAGNOSIS — M778 Other enthesopathies, not elsewhere classified: Secondary | ICD-10-CM | POA: Diagnosis not present

## 2015-04-30 ENCOUNTER — Encounter: Payer: Self-pay | Admitting: Emergency Medicine

## 2015-04-30 MED ORDER — OMEPRAZOLE 20 MG PO CPDR: DELAYED_RELEASE_CAPSULE | ORAL | Status: DC

## 2015-05-04 DIAGNOSIS — M2550 Pain in unspecified joint: Secondary | ICD-10-CM | POA: Diagnosis not present

## 2015-05-04 DIAGNOSIS — M7989 Other specified soft tissue disorders: Secondary | ICD-10-CM | POA: Diagnosis not present

## 2015-05-18 DIAGNOSIS — M7989 Other specified soft tissue disorders: Secondary | ICD-10-CM | POA: Diagnosis not present

## 2015-05-18 DIAGNOSIS — M2550 Pain in unspecified joint: Secondary | ICD-10-CM | POA: Diagnosis not present

## 2015-05-26 ENCOUNTER — Other Ambulatory Visit (INDEPENDENT_AMBULATORY_CARE_PROVIDER_SITE_OTHER): Payer: Medicare Other

## 2015-05-26 ENCOUNTER — Ambulatory Visit (INDEPENDENT_AMBULATORY_CARE_PROVIDER_SITE_OTHER): Payer: Medicare Other | Admitting: Emergency Medicine

## 2015-05-26 ENCOUNTER — Encounter: Payer: Self-pay | Admitting: Emergency Medicine

## 2015-05-26 VITALS — BP 160/102 | HR 80 | Ht 62.0 in | Wt 175.0 lb

## 2015-05-26 DIAGNOSIS — E611 Iron deficiency: Secondary | ICD-10-CM

## 2015-05-26 DIAGNOSIS — G4761 Periodic limb movement disorder: Secondary | ICD-10-CM | POA: Insufficient documentation

## 2015-05-26 DIAGNOSIS — R0600 Dyspnea, unspecified: Secondary | ICD-10-CM | POA: Diagnosis not present

## 2015-05-26 DIAGNOSIS — R0789 Other chest pain: Secondary | ICD-10-CM

## 2015-05-26 DIAGNOSIS — R053 Chronic cough: Secondary | ICD-10-CM

## 2015-05-26 DIAGNOSIS — G2581 Restless legs syndrome: Secondary | ICD-10-CM | POA: Diagnosis not present

## 2015-05-26 DIAGNOSIS — R0609 Other forms of dyspnea: Secondary | ICD-10-CM | POA: Diagnosis not present

## 2015-05-26 DIAGNOSIS — R05 Cough: Secondary | ICD-10-CM

## 2015-05-26 LAB — PULMONARY FUNCTION TEST
DL/VA % pred: 98 %
DL/VA: 4.46 ml/min/mmHg/L
DLCO UNC: 18.73 ml/min/mmHg
DLCO unc % pred: 86 %
FEF 25-75 Post: 2.49 L/sec
FEF 25-75 Pre: 1.9 L/sec
FEF2575-%Change-Post: 31 %
FEF2575-%PRED-POST: 138 %
FEF2575-%Pred-Pre: 105 %
FEV1-%CHANGE-POST: 4 %
FEV1-%PRED-PRE: 100 %
FEV1-%Pred-Post: 105 %
FEV1-POST: 2.2 L
FEV1-PRE: 2.11 L
FEV1FVC-%Change-Post: 3 %
FEV1FVC-%PRED-PRE: 105 %
FEV6-%Change-Post: 0 %
FEV6-%PRED-PRE: 99 %
FEV6-%Pred-Post: 100 %
FEV6-POST: 2.66 L
FEV6-Pre: 2.63 L
FEV6FVC-%PRED-POST: 104 %
FEV6FVC-%Pred-Pre: 104 %
FVC-%CHANGE-POST: 0 %
FVC-%PRED-POST: 96 %
FVC-%PRED-PRE: 95 %
FVC-POST: 2.66 L
FVC-PRE: 2.63 L
PRE FEV6/FVC RATIO: 100 %
Post FEV1/FVC ratio: 83 %
Post FEV6/FVC ratio: 100 %
Pre FEV1/FVC ratio: 80 %
RV % PRED: 100 %
RV: 2.1 L
TLC % pred: 97 %
TLC: 4.62 L

## 2015-05-26 LAB — IBC PANEL
Iron: 35 ug/dL — ABNORMAL LOW (ref 42–145)
Saturation Ratios: 8.3 % — ABNORMAL LOW (ref 20.0–50.0)
TRANSFERRIN: 301 mg/dL (ref 212.0–360.0)

## 2015-05-26 NOTE — Patient Instructions (Signed)
Pulmonary function testing does not show any evidence of asthma or underlying lung problem.  Please continue omeprazole and Zyrtec We will refer you to cardiology to evaluate your shortness of breath further We will check an iron panel today. Depending on the results we may need to discuss further the possibility of periodic limb movement disorder.  Follow with Dr Lamonte Sakai in 3 months or sooner if you have any problems.

## 2015-05-26 NOTE — Assessment & Plan Note (Signed)
She describes symptoms of left upper and lower extremity discomfort and need to move that can happen during the day but are most bothersome at night when she is trying to go to sleep. I suspect PLMD. Discussed this with her today. I will check an iron panel. She will keep her caffeine to a minimum and will try melatonin.

## 2015-05-26 NOTE — Progress Notes (Signed)
PFT done today. 

## 2015-05-26 NOTE — Assessment & Plan Note (Signed)
Her pulmonary function testing is reassuring. I suspect that there is a significant component of deconditioning to explain her exertional dyspnea. I discussed with her the potential risk for underlying cardiac disease. Although she no longer has exertional chest discomfort think it would be reasonable for her to have a cardiology evaluation for risk stratification

## 2015-05-26 NOTE — Progress Notes (Signed)
Subjective:    Patient ID: Denise [name], female    DOB: 07/09/1946, 69 y.o.   MRN: [id number]  HPI 69 year old never smoker with a history of arthritis, benign breast tumors. She has a remote hx of cough and has been treated for asthma in 2003 after an exposure to roofing tar. Subsequent w/u showed no asthma (? PFT). She has had chronic cough to a lesser degree since. She has required cough syrup on many occasions. She has also had intermittent dyspnea. This Summer she had another significant coughing flare and CXR's were done that suggested PNA, was treated with abx. Also received pred at one point this Summer. She was told that her CXR did improve. She has also been dealing with R hand and joint inflammation and she was given pred for that. No other joints seem to be affected. Still having dyspnea although slightly improved, worst when supine. The cough is a bit better but can flare.  She is having CP and pressure right now even at rest.   She underwent chest x-rays at [facility] in June that showed a possible left lower lobe infiltrate. I have reviewed also a CT scan of the chest performed 03/16/15 that showed no evidence of pulmonary embolism or parenchymal abnormality. There is no evidence of pneumonia or scarring. There may be some very slight bilateral lower lobe atelectasis. Her principle symptom only met in September was cough. We started empiric omeprazole, zyrtec. She has continued to cough, little change.  She underwent pulmonary function testing today 05/26/15 that I have personally reviewed. This shows normal spirometry without bronchodilator response, normal lung volumes, normal diffusion.  She describes an uncomfortable movement in her L UE and LE that sounds like PMLD, can happen during day and definitely keeps her from sleeping.     Review of Systems  Constitutional: Negative for fever and unexpected weight change.  HENT: Negative for congestion, dental problem, ear pain,  nosebleeds, postnasal drip, rhinorrhea, sinus pressure, sneezing, sore throat and trouble swallowing.   Eyes: Negative for redness and itching.  Respiratory: Positive for cough and shortness of breath. Negative for chest tightness and wheezing.   Cardiovascular: Positive for chest pain. Negative for palpitations and leg swelling.  Gastrointestinal: Negative for nausea and vomiting.  Genitourinary: Negative for dysuria.  Musculoskeletal: Positive for arthralgias. Negative for joint swelling.  Skin: Negative for rash.  Neurological: Negative for headaches.  Hematological: Does not bruise/bleed easily.  Psychiatric/Behavioral: Negative for dysphoric mood. The patient is not nervous/anxious.     Past Medical History  Diagnosis Date  . Arthritis   . Constipation      Family History  Problem Relation Age of Onset  . Breast cancer      aunts  . Liver disease Father   . Bone cancer Mother   . Cirrhosis Father   . Colon cancer Neg Hx      Social History   Social History  . Marital Status: Married    Spouse Name: N/A  . Number of Children: N/A  . Years of Education: N/A   Occupational History  . Self Employed    Social History Main Topics  . Smoking status: Never Smoker   . Smokeless tobacco: Never Used  . Alcohol Use: No  . Drug Use: No  . Sexual Activity: Not on file   Other Topics Concern  . Not on file   Social History Narrative     No Known Allergies   Outpatient Prescriptions Prior to  Visit  Medication Sig Dispense Refill  . Ascorbic Acid (VITAMIN C CR) 500 MG TBCR Take by mouth 1 day or 1 dose.      . Calcium Carbonate (CALTRATE 600) 1500 MG TABS Take by mouth 1 day or 1 dose.      . Multiple Vitamin (MULTIVITAMIN) tablet Take 1 tablet by mouth daily.      Marland Kitchen omeprazole (PRILOSEC) 20 MG capsule Take 1 capsule BID for 2 weeks then decrease to 1 capsule daily 50 capsule 0  . traMADol (ULTRAM) 50 MG tablet Take 50 mg by mouth every 6 (six) hours as needed.    .  Fiber CAPS Take 1 capsule by mouth 2 (two) times daily.      . meloxicam (MOBIC) 15 MG tablet Take 15 mg by mouth daily.     No facility-administered medications prior to visit.         Objective:   Physical Exam Filed Vitals:   05/26/15 1553 05/26/15 1556  BP:  160/102  Pulse:  80  Height: _0  (1.575 m)   Weight: 175 lb (79.379 kg)   SpO2:  96%   Gen: Pleasant, well-nourished, in no distress,  normal affect, slightly anxious  ENT: No lesions,  mouth clear,  oropharynx clear, no postnasal drip  Neck: No JVD, no TMG, no carotid bruits  Lungs: No use of accessory muscles, clear without rales or rhonchi  Cardiovascular: RRR, heart sounds normal, no murmur or gallops, no peripheral edema  Musculoskeletal: No deformities, no cyanosis or clubbing  Neuro: alert, non focal  Skin: Warm, no lesions or rashes      Assessment & Plan:  Dyspnea Her pulmonary function testing is reassuring. I suspect that there is a significant component of deconditioning to explain her exertional dyspnea. I discussed with her the potential risk for underlying cardiac disease. Although she no longer has exertional chest discomfort think it would be reasonable for her to have a cardiology evaluation for risk stratification  Chronic cough Continue omeprazole and Zyrtec Try adding chlorpheniramine 4 mg daily at bedtime and every 6 hours when necessary  Periodic limb movement disorder (PLMD) She describes symptoms of left upper and lower extremity discomfort and need to move that can happen during the day but are most bothersome at night when she is trying to go to sleep. I suspect PLMD. Discussed this with her today. I will check an iron panel. She will keep her caffeine to a minimum and will try melatonin.

## 2015-05-26 NOTE — Assessment & Plan Note (Signed)
Continue omeprazole and Zyrtec Try adding chlorpheniramine 4 mg daily at bedtime and every 6 hours when necessary

## 2015-05-27 ENCOUNTER — Other Ambulatory Visit: Payer: Self-pay | Admitting: Emergency Medicine

## 2015-05-28 ENCOUNTER — Other Ambulatory Visit: Payer: Self-pay | Admitting: Emergency Medicine

## 2015-06-06 ENCOUNTER — Encounter: Payer: Self-pay | Admitting: Emergency Medicine

## 2015-06-07 NOTE — Telephone Encounter (Signed)
MyChart Message:  Patient requesting results from labs.  She reviewed the results online and has questions about what it means.  RB - please advise.

## 2015-06-08 DIAGNOSIS — G5601 Carpal tunnel syndrome, right upper limb: Secondary | ICD-10-CM | POA: Diagnosis not present

## 2015-06-15 ENCOUNTER — Ambulatory Visit (INDEPENDENT_AMBULATORY_CARE_PROVIDER_SITE_OTHER): Payer: Medicare Other | Admitting: Cardiology

## 2015-06-15 ENCOUNTER — Encounter: Payer: Self-pay | Admitting: Cardiology

## 2015-06-15 VITALS — BP 160/92 | HR 86 | Ht 62.0 in | Wt 173.0 lb

## 2015-06-15 DIAGNOSIS — R072 Precordial pain: Secondary | ICD-10-CM

## 2015-06-15 DIAGNOSIS — I10 Essential (primary) hypertension: Secondary | ICD-10-CM | POA: Diagnosis not present

## 2015-06-15 DIAGNOSIS — R0600 Dyspnea, unspecified: Secondary | ICD-10-CM | POA: Diagnosis not present

## 2015-06-15 DIAGNOSIS — R799 Abnormal finding of blood chemistry, unspecified: Secondary | ICD-10-CM

## 2015-06-15 DIAGNOSIS — R0789 Other chest pain: Secondary | ICD-10-CM | POA: Diagnosis not present

## 2015-06-15 DIAGNOSIS — R0609 Other forms of dyspnea: Secondary | ICD-10-CM | POA: Insufficient documentation

## 2015-06-15 DIAGNOSIS — R790 Abnormal level of blood mineral: Secondary | ICD-10-CM

## 2015-06-15 DIAGNOSIS — I152 Hypertension secondary to endocrine disorders: Secondary | ICD-10-CM | POA: Insufficient documentation

## 2015-06-15 DIAGNOSIS — R079 Chest pain, unspecified: Secondary | ICD-10-CM | POA: Insufficient documentation

## 2015-06-15 HISTORY — DX: Essential (primary) hypertension: I10

## 2015-06-15 NOTE — Patient Instructions (Signed)
Medication Instructions:   Your physician recommends that you continue on your current medications as directed. Please refer to the Current Medication list given to you today.     Labwork:  SCHEDULE THIS THE SAME DAY AS YOUR EXERCISE MYOVIEW TO CHECK A ---CMET, CBC W DIFF, AND LIPIDS---PLEASE COME FASTING TO THIS LAB APPOINTMENT     Testing/Procedures:  Your physician has requested that you have en exercise stress myoview. For further information please visit [email or web address]. Please follow instruction sheet, as given.     Follow-Up:  2 MONTHS WITH DR Meda Coffee       If you need a refill on your cardiac medications before your next appointment, please call your pharmacy.

## 2015-06-15 NOTE — Progress Notes (Signed)
Patient ID: Denise [name], female   DOB: 06/21/1946, 69 y.o.   MRN: [id number]      Cardiology Office Note  Date:  06/15/2015   ID:  Denise [name], DOB 01-28-1946, MRN [id number]  PCP:  Rochel Brome, MD  Cardiologist:   Dorothy Spark, MD   Chief Complaint  Patient presents with  . chest tightness  . Shortness of Breath     History of Present Illness: Denise [name] is a 69 y.o. female who presents for evaluation of DOE. The patient is working as a Copy, her problems started years ago when she developed chronic cough and was treated for asthma. She has been followed by pulmonary on and off on antihistaminics and PPIs with minimal relief.  Her cough is improved now. She has also noticed worsening DOE, gets really SOB when walking 2-3 flight of stairs. No chest pain, no palpitations of falls. No FH of premature CAD or SCD. No claudications.  She has noticed lately her BP being in 140 range on multiple occassions.   Past Medical History  Diagnosis Date  . Arthritis   . Constipation   . HTN (hypertension) 06/15/2015   Past Surgical History  Procedure Laterality Date  . Breast lumpectomy      5 times  . Cholecystectomy    . Rotator cuff repair    . Tubal ligation    . Ankle surgery    . Cataract extraction     Current Outpatient Prescriptions  Medication Sig Dispense Refill  . Ascorbic Acid (VITAMIN C CR) 500 MG TBCR Take by mouth 1 day or 1 dose.      . Calcium Carbonate (CALTRATE 600) 1500 MG TABS Take by mouth 1 day or 1 dose.      . celecoxib (CELEBREX) 200 MG capsule Take 200 mg by mouth daily.    . cetirizine (ZYRTEC) 10 MG tablet Take 10 mg by mouth daily.    . Melatonin 5 MG TABS Take 1 tablet by mouth as needed (sleep).    . meloxicam (MOBIC) 15 MG tablet Take 15 mg by mouth daily.     . Multiple Vitamin (MULTIVITAMIN) tablet Take 1 tablet by mouth daily.      Marland Kitchen omeprazole (PRILOSEC) 20 MG capsule Take 1 capsule (20 mg total) by  mouth daily. 30 capsule 5  . traMADol (ULTRAM) 50 MG tablet Take 50 mg by mouth every 6 (six) hours as needed.     No current facility-administered medications for this visit.   Allergies:   Review of patient's allergies indicates no known allergies.   Social History:  The patient  reports that she has never smoked. She has never used smokeless tobacco. She reports that she does not drink alcohol or use illicit drugs.   Family History:  The patient's  family history includes Bone cancer in her mother; Breast cancer in an other family member; Cirrhosis in her father; Liver disease in her father. There is no history of Colon cancer.   ROS:  Please see the history of present illness.   Otherwise, review of systems are positive for none.   All other systems are reviewed and negative.   PHYSICAL EXAM: VS:  BP 160/92 mmHg  Pulse 86  Ht _0  (1.575 m)  Wt 173 lb (78.472 kg)  BMI 31.63 kg/m2 , BMI Body mass index is 31.63 kg/(m^2). GEN: Well nourished, well developed, in no acute distress HEENT: normal Neck: no JVD, carotid bruits, or  masses Cardiac: RRR; no murmurs, rubs, or gallops,no edema  Respiratory:  clear to auscultation bilaterally, normal work of breathing GI: soft, nontender, nondistended, + BS MS: no deformity or atrophy Skin: warm and dry, no rash Neuro:  Strength and sensation are intact Psych: euthymic mood, full affect  EKG:  EKG is ordered today. The ekg ordered today demonstrates SR, normal ECG  Recent Labs: No results found for requested labs within last 365 days.   Lipid Panel No results found for: CHOL, TRIG, HDL, CHOLHDL, VLDL, LDLCALC, LDLDIRECT   Wt Readings from Last 3 Encounters:  06/15/15 173 lb (78.472 kg)  05/26/15 175 lb (79.379 kg)  04/08/15 169 lb (76.658 kg)      ASSESSMENT AND PLAN:  1.  DOE - risk factors include untreated HTN, unknown lipids, obesity. We will schedule an exercise nuclear stress test (high chance she won't be able to walk on  a treadmill).  2. HTN - significantly elevated, offered amlodipine 2.5 mg po daily, she states that she is "not ready" for BP medication yet. We will discuss after the stress test based on her BP response to stress.  3. Lipids - we will check  4. Low iron - from pulmonary office, unknown Hb, we will check and treat appropriately.    Orders Placed This Encounter  Procedures  . Comp Met (CMET)  . CBC w/Diff  . Lipid Profile  . Myocardial Perfusion Imaging  . EKG 12-Lead   Follow up in 2 months.   Signed, Dorothy Spark, MD  06/15/2015 12:33 PM    [address], [place], [place]  [address] Phone: [phone]; Fax: [phone]

## 2015-06-16 ENCOUNTER — Telehealth (HOSPITAL_COMMUNITY): Payer: Self-pay | Admitting: *Deleted

## 2015-06-16 NOTE — Telephone Encounter (Signed)
Patient given detailed instructions per Myocardial Perfusion Study Information Sheet for the test on 06/18/15 at 715 Patient notified to arrive 15 minutes early and that it is imperative to arrive on time for appointment to keep from having the test rescheduled.  If you need to cancel or reschedule your appointment, please call the office within 24 hours of your appointment. Failure to do so may result in a cancellation of your appointment, and a $50 no show fee. Patient verbalized understanding.Hubbard Robinson, RN

## 2015-06-22 ENCOUNTER — Telehealth: Payer: Self-pay | Admitting: *Deleted

## 2015-06-22 ENCOUNTER — Other Ambulatory Visit (INDEPENDENT_AMBULATORY_CARE_PROVIDER_SITE_OTHER): Payer: Medicare Other | Admitting: *Deleted

## 2015-06-22 ENCOUNTER — Ambulatory Visit (HOSPITAL_COMMUNITY): Payer: Medicare Other | Attending: Cardiology

## 2015-06-22 DIAGNOSIS — I10 Essential (primary) hypertension: Secondary | ICD-10-CM

## 2015-06-22 DIAGNOSIS — R0609 Other forms of dyspnea: Secondary | ICD-10-CM

## 2015-06-22 DIAGNOSIS — R072 Precordial pain: Secondary | ICD-10-CM

## 2015-06-22 DIAGNOSIS — R0789 Other chest pain: Secondary | ICD-10-CM

## 2015-06-22 DIAGNOSIS — R0600 Dyspnea, unspecified: Secondary | ICD-10-CM

## 2015-06-22 LAB — MYOCARDIAL PERFUSION IMAGING
LV dias vol: 72 mL
LV sys vol: 20 mL
Peak HR: 94 {beats}/min
RATE: 0.32
Rest HR: 62 {beats}/min
SDS: 3
SRS: 8
SSS: 11
TID: 0.96

## 2015-06-22 LAB — COMPREHENSIVE METABOLIC PANEL
ALT: 16 U/L (ref 6–29)
AST: 25 U/L (ref 10–35)
Albumin: 3.9 g/dL (ref 3.6–5.1)
Alkaline Phosphatase: 87 U/L (ref 33–130)
BUN: 17 mg/dL (ref 7–25)
CO2: 26 mmol/L (ref 20–31)
Calcium: 9 mg/dL (ref 8.6–10.4)
Chloride: 105 mmol/L (ref 98–110)
Creat: 0.78 mg/dL (ref 0.50–0.99)
Glucose, Bld: 127 mg/dL — ABNORMAL HIGH (ref 65–99)
Potassium: 3.9 mmol/L (ref 3.5–5.3)
Sodium: 140 mmol/L (ref 135–146)
Total Bilirubin: 0.7 mg/dL (ref 0.2–1.2)
Total Protein: 7 g/dL (ref 6.1–8.1)

## 2015-06-22 LAB — CBC WITH DIFFERENTIAL/PLATELET
Basophils Absolute: 0 10*3/uL (ref 0.0–0.1)
Basophils Relative: 0 % (ref 0–1)
Eosinophils Absolute: 0.1 10*3/uL (ref 0.0–0.7)
Eosinophils Relative: 1 % (ref 0–5)
HCT: 38 % (ref 36.0–46.0)
Hemoglobin: 13.1 g/dL (ref 12.0–15.0)
Lymphocytes Relative: 17 % (ref 12–46)
Lymphs Abs: 1.7 10*3/uL (ref 0.7–4.0)
MCH: 29.4 pg (ref 26.0–34.0)
MCHC: 34.5 g/dL (ref 30.0–36.0)
MCV: 85.4 fL (ref 78.0–100.0)
MPV: 8.7 fL (ref 8.6–12.4)
Monocytes Absolute: 0.7 10*3/uL (ref 0.1–1.0)
Monocytes Relative: 7 % (ref 3–12)
Neutro Abs: 7.5 10*3/uL (ref 1.7–7.7)
Neutrophils Relative %: 75 % (ref 43–77)
Platelets: 221 10*3/uL (ref 150–400)
RBC: 4.45 MIL/uL (ref 3.87–5.11)
RDW: 13.3 % (ref 11.5–15.5)
WBC: 10 10*3/uL (ref 4.0–10.5)

## 2015-06-22 LAB — LIPID PANEL
Cholesterol: 177 mg/dL (ref 125–200)
HDL: 53 mg/dL (ref >=46)
LDL Cholesterol: 105 mg/dL (ref <130)
Total CHOL/HDL Ratio: 3.3 Ratio (ref <=5.0)
Triglycerides: 95 mg/dL (ref <150)
VLDL: 19 mg/dL (ref <30)

## 2015-06-22 MED ORDER — TECHNETIUM TC 99M SESTAMIBI GENERIC - CARDIOLITE
10.1000 | Freq: Once | INTRAVENOUS | Status: AC | PRN
  Administered 2015-06-22: 10.1 via INTRAVENOUS

## 2015-06-22 MED ORDER — REGADENOSON 0.4 MG/5ML IV SOLN
0.4000 mg | Freq: Once | INTRAVENOUS | Status: AC
  Administered 2015-06-22: 0.4 mg via INTRAVENOUS

## 2015-06-22 MED ORDER — TECHNETIUM TC 99M SESTAMIBI GENERIC - CARDIOLITE
32.3000 | Freq: Once | INTRAVENOUS | Status: AC | PRN
  Administered 2015-06-22: 32.3 via INTRAVENOUS

## 2015-06-22 MED ORDER — AMINOPHYLLINE 25 MG/ML IV SOLN
75.0000 mg | Freq: Once | INTRAVENOUS | Status: AC
  Administered 2015-06-22: 75 mg via INTRAVENOUS

## 2015-06-22 NOTE — Telephone Encounter (Signed)
I called to check on the patient after she had a pharmacologic myocardial perfusion study which worsened pre-existing symptoms. The EKGs and images were reviewed and I did not see anything that appeared to suggest ischemia. Just spoke with her at home, she feels back to normal. I reassured her that Dr. Meda Coffee would review all the data and that someone would be back in touch with her concerning the final impression related to her heart workup.

## 2015-06-22 NOTE — Telephone Encounter (Signed)
Called the pt to check on her from her recent chest pain episode and having nausea during her nuclear stress test today.  Informed the pt that I spoke with Dr Tamala Julian about her episode this morning, and Dr Tamala Julian wanted me to call and check in on her this afternoon.  Per the pt, she states she is feeling much better now.  Pt states that the 1 nitroglycerin that nuclear dept administered her this morning, relieved her chest pain and nausea.  Pt states she is completely asymptomatic at this time with no complaints of chest pain, sob, nausea, palpitations, dizziness, pre-syncopal, or syncopal episodes.  Pt states she is resting comfortably at this time.  Informed the pt that I will route this message to Dr Meda Coffee and Dr Tamala Julian to make them aware of her current health status.  Informed the pt that I will call her back with her myoview results, once Dr Meda Coffee has reviewed this.  Pt verbalized understanding, agrees with this plan, and very gracious for all the assistance provided by the staff.

## 2015-06-22 NOTE — Telephone Encounter (Signed)
Agree with his note. I spoke to her as well.

## 2015-07-06 DIAGNOSIS — G5601 Carpal tunnel syndrome, right upper limb: Secondary | ICD-10-CM | POA: Diagnosis not present

## 2015-07-29 ENCOUNTER — Encounter: Payer: Self-pay | Admitting: Cardiology

## 2015-08-10 DIAGNOSIS — G5601 Carpal tunnel syndrome, right upper limb: Secondary | ICD-10-CM | POA: Diagnosis not present

## 2015-08-13 ENCOUNTER — Ambulatory Visit: Payer: Medicare Other | Admitting: Cardiology

## 2015-08-18 ENCOUNTER — Ambulatory Visit: Payer: Medicare Other | Admitting: Cardiology

## 2015-09-09 DIAGNOSIS — G5601 Carpal tunnel syndrome, right upper limb: Secondary | ICD-10-CM | POA: Diagnosis not present

## 2015-09-23 DIAGNOSIS — M542 Cervicalgia: Secondary | ICD-10-CM | POA: Diagnosis not present

## 2015-09-23 DIAGNOSIS — M25512 Pain in left shoulder: Secondary | ICD-10-CM | POA: Diagnosis not present

## 2015-10-06 DIAGNOSIS — M65321 Trigger finger, right index finger: Secondary | ICD-10-CM | POA: Diagnosis not present

## 2015-10-06 DIAGNOSIS — G5601 Carpal tunnel syndrome, right upper limb: Secondary | ICD-10-CM | POA: Diagnosis not present

## 2015-10-06 DIAGNOSIS — M65841 Other synovitis and tenosynovitis, right hand: Secondary | ICD-10-CM | POA: Diagnosis not present

## 2015-10-19 DIAGNOSIS — G5601 Carpal tunnel syndrome, right upper limb: Secondary | ICD-10-CM | POA: Diagnosis not present

## 2015-10-19 DIAGNOSIS — Z4789 Encounter for other orthopedic aftercare: Secondary | ICD-10-CM | POA: Diagnosis not present

## 2015-11-08 DIAGNOSIS — M25512 Pain in left shoulder: Secondary | ICD-10-CM | POA: Diagnosis not present

## 2015-11-08 DIAGNOSIS — M542 Cervicalgia: Secondary | ICD-10-CM | POA: Diagnosis not present

## 2015-11-16 DIAGNOSIS — Z4789 Encounter for other orthopedic aftercare: Secondary | ICD-10-CM | POA: Diagnosis not present

## 2015-11-16 DIAGNOSIS — G5601 Carpal tunnel syndrome, right upper limb: Secondary | ICD-10-CM | POA: Diagnosis not present

## 2015-11-17 DIAGNOSIS — I10 Essential (primary) hypertension: Secondary | ICD-10-CM | POA: Diagnosis not present

## 2015-11-22 DIAGNOSIS — H524 Presbyopia: Secondary | ICD-10-CM | POA: Diagnosis not present

## 2015-11-22 DIAGNOSIS — H26493 Other secondary cataract, bilateral: Secondary | ICD-10-CM | POA: Diagnosis not present

## 2015-12-14 DIAGNOSIS — I10 Essential (primary) hypertension: Secondary | ICD-10-CM | POA: Diagnosis not present

## 2015-12-16 DIAGNOSIS — G5601 Carpal tunnel syndrome, right upper limb: Secondary | ICD-10-CM | POA: Diagnosis not present

## 2015-12-16 DIAGNOSIS — Z4789 Encounter for other orthopedic aftercare: Secondary | ICD-10-CM | POA: Diagnosis not present

## 2016-02-01 DIAGNOSIS — G5601 Carpal tunnel syndrome, right upper limb: Secondary | ICD-10-CM | POA: Diagnosis not present

## 2016-02-02 DIAGNOSIS — M6281 Muscle weakness (generalized): Secondary | ICD-10-CM | POA: Diagnosis not present

## 2016-02-02 DIAGNOSIS — M25631 Stiffness of right wrist, not elsewhere classified: Secondary | ICD-10-CM | POA: Diagnosis not present

## 2016-02-02 DIAGNOSIS — M79641 Pain in right hand: Secondary | ICD-10-CM | POA: Diagnosis not present

## 2016-02-02 DIAGNOSIS — M25431 Effusion, right wrist: Secondary | ICD-10-CM | POA: Diagnosis not present

## 2016-02-04 DIAGNOSIS — M25631 Stiffness of right wrist, not elsewhere classified: Secondary | ICD-10-CM | POA: Diagnosis not present

## 2016-02-04 DIAGNOSIS — M6281 Muscle weakness (generalized): Secondary | ICD-10-CM | POA: Diagnosis not present

## 2016-02-04 DIAGNOSIS — M25431 Effusion, right wrist: Secondary | ICD-10-CM | POA: Diagnosis not present

## 2016-02-04 DIAGNOSIS — M79641 Pain in right hand: Secondary | ICD-10-CM | POA: Diagnosis not present

## 2016-02-07 DIAGNOSIS — M6281 Muscle weakness (generalized): Secondary | ICD-10-CM | POA: Diagnosis not present

## 2016-02-07 DIAGNOSIS — M25431 Effusion, right wrist: Secondary | ICD-10-CM | POA: Diagnosis not present

## 2016-02-07 DIAGNOSIS — M25631 Stiffness of right wrist, not elsewhere classified: Secondary | ICD-10-CM | POA: Diagnosis not present

## 2016-02-07 DIAGNOSIS — M79641 Pain in right hand: Secondary | ICD-10-CM | POA: Diagnosis not present

## 2016-02-09 DIAGNOSIS — M25431 Effusion, right wrist: Secondary | ICD-10-CM | POA: Diagnosis not present

## 2016-02-09 DIAGNOSIS — M25631 Stiffness of right wrist, not elsewhere classified: Secondary | ICD-10-CM | POA: Diagnosis not present

## 2016-02-09 DIAGNOSIS — M79641 Pain in right hand: Secondary | ICD-10-CM | POA: Diagnosis not present

## 2016-02-09 DIAGNOSIS — M6281 Muscle weakness (generalized): Secondary | ICD-10-CM | POA: Diagnosis not present

## 2016-02-11 DIAGNOSIS — M6281 Muscle weakness (generalized): Secondary | ICD-10-CM | POA: Diagnosis not present

## 2016-02-11 DIAGNOSIS — M25631 Stiffness of right wrist, not elsewhere classified: Secondary | ICD-10-CM | POA: Diagnosis not present

## 2016-02-11 DIAGNOSIS — M79641 Pain in right hand: Secondary | ICD-10-CM | POA: Diagnosis not present

## 2016-02-11 DIAGNOSIS — M25431 Effusion, right wrist: Secondary | ICD-10-CM | POA: Diagnosis not present

## 2016-02-14 DIAGNOSIS — M25431 Effusion, right wrist: Secondary | ICD-10-CM | POA: Diagnosis not present

## 2016-02-14 DIAGNOSIS — M6281 Muscle weakness (generalized): Secondary | ICD-10-CM | POA: Diagnosis not present

## 2016-02-14 DIAGNOSIS — M79641 Pain in right hand: Secondary | ICD-10-CM | POA: Diagnosis not present

## 2016-02-14 DIAGNOSIS — M25631 Stiffness of right wrist, not elsewhere classified: Secondary | ICD-10-CM | POA: Diagnosis not present

## 2016-02-17 DIAGNOSIS — M25512 Pain in left shoulder: Secondary | ICD-10-CM | POA: Diagnosis not present

## 2016-02-17 DIAGNOSIS — M5137 Other intervertebral disc degeneration, lumbosacral region: Secondary | ICD-10-CM | POA: Diagnosis not present

## 2016-02-21 ENCOUNTER — Other Ambulatory Visit: Payer: Self-pay | Admitting: Obstetrics & Gynecology

## 2016-02-21 DIAGNOSIS — I10 Essential (primary) hypertension: Secondary | ICD-10-CM | POA: Diagnosis not present

## 2016-02-21 DIAGNOSIS — M79641 Pain in right hand: Secondary | ICD-10-CM | POA: Diagnosis not present

## 2016-02-21 DIAGNOSIS — Z6832 Body mass index (BMI) 32.0-32.9, adult: Secondary | ICD-10-CM | POA: Diagnosis not present

## 2016-02-21 DIAGNOSIS — M6281 Muscle weakness (generalized): Secondary | ICD-10-CM | POA: Diagnosis not present

## 2016-02-21 DIAGNOSIS — M25631 Stiffness of right wrist, not elsewhere classified: Secondary | ICD-10-CM | POA: Diagnosis not present

## 2016-02-21 DIAGNOSIS — R5383 Other fatigue: Secondary | ICD-10-CM | POA: Diagnosis not present

## 2016-02-21 DIAGNOSIS — Z124 Encounter for screening for malignant neoplasm of cervix: Secondary | ICD-10-CM | POA: Diagnosis not present

## 2016-02-21 DIAGNOSIS — M25431 Effusion, right wrist: Secondary | ICD-10-CM | POA: Diagnosis not present

## 2016-02-22 LAB — CYTOLOGY - PAP

## 2016-02-23 DIAGNOSIS — M6281 Muscle weakness (generalized): Secondary | ICD-10-CM | POA: Diagnosis not present

## 2016-02-23 DIAGNOSIS — M79641 Pain in right hand: Secondary | ICD-10-CM | POA: Diagnosis not present

## 2016-02-23 DIAGNOSIS — M25431 Effusion, right wrist: Secondary | ICD-10-CM | POA: Diagnosis not present

## 2016-02-23 DIAGNOSIS — M25631 Stiffness of right wrist, not elsewhere classified: Secondary | ICD-10-CM | POA: Diagnosis not present

## 2016-02-24 DIAGNOSIS — M25631 Stiffness of right wrist, not elsewhere classified: Secondary | ICD-10-CM | POA: Diagnosis not present

## 2016-02-24 DIAGNOSIS — M25431 Effusion, right wrist: Secondary | ICD-10-CM | POA: Diagnosis not present

## 2016-02-24 DIAGNOSIS — M6281 Muscle weakness (generalized): Secondary | ICD-10-CM | POA: Diagnosis not present

## 2016-02-24 DIAGNOSIS — M79641 Pain in right hand: Secondary | ICD-10-CM | POA: Diagnosis not present

## 2016-02-28 DIAGNOSIS — M25631 Stiffness of right wrist, not elsewhere classified: Secondary | ICD-10-CM | POA: Diagnosis not present

## 2016-02-28 DIAGNOSIS — M6281 Muscle weakness (generalized): Secondary | ICD-10-CM | POA: Diagnosis not present

## 2016-02-28 DIAGNOSIS — M25431 Effusion, right wrist: Secondary | ICD-10-CM | POA: Diagnosis not present

## 2016-02-28 DIAGNOSIS — M79641 Pain in right hand: Secondary | ICD-10-CM | POA: Diagnosis not present

## 2016-02-29 DIAGNOSIS — E782 Mixed hyperlipidemia: Secondary | ICD-10-CM | POA: Diagnosis not present

## 2016-02-29 DIAGNOSIS — I1310 Hypertensive heart and chronic kidney disease without heart failure, with stage 1 through stage 4 chronic kidney disease, or unspecified chronic kidney disease: Secondary | ICD-10-CM | POA: Diagnosis not present

## 2016-02-29 DIAGNOSIS — I10 Essential (primary) hypertension: Secondary | ICD-10-CM | POA: Diagnosis not present

## 2016-03-01 DIAGNOSIS — M25431 Effusion, right wrist: Secondary | ICD-10-CM | POA: Diagnosis not present

## 2016-03-01 DIAGNOSIS — M6281 Muscle weakness (generalized): Secondary | ICD-10-CM | POA: Diagnosis not present

## 2016-03-01 DIAGNOSIS — M25631 Stiffness of right wrist, not elsewhere classified: Secondary | ICD-10-CM | POA: Diagnosis not present

## 2016-03-01 DIAGNOSIS — M79641 Pain in right hand: Secondary | ICD-10-CM | POA: Diagnosis not present

## 2016-03-03 DIAGNOSIS — M25631 Stiffness of right wrist, not elsewhere classified: Secondary | ICD-10-CM | POA: Diagnosis not present

## 2016-03-03 DIAGNOSIS — M25431 Effusion, right wrist: Secondary | ICD-10-CM | POA: Diagnosis not present

## 2016-03-03 DIAGNOSIS — M6281 Muscle weakness (generalized): Secondary | ICD-10-CM | POA: Diagnosis not present

## 2016-03-03 DIAGNOSIS — M79641 Pain in right hand: Secondary | ICD-10-CM | POA: Diagnosis not present

## 2016-03-06 DIAGNOSIS — M25431 Effusion, right wrist: Secondary | ICD-10-CM | POA: Diagnosis not present

## 2016-03-06 DIAGNOSIS — M6281 Muscle weakness (generalized): Secondary | ICD-10-CM | POA: Diagnosis not present

## 2016-03-06 DIAGNOSIS — M25631 Stiffness of right wrist, not elsewhere classified: Secondary | ICD-10-CM | POA: Diagnosis not present

## 2016-03-06 DIAGNOSIS — M79641 Pain in right hand: Secondary | ICD-10-CM | POA: Diagnosis not present

## 2016-03-07 DIAGNOSIS — G5601 Carpal tunnel syndrome, right upper limb: Secondary | ICD-10-CM | POA: Diagnosis not present

## 2016-03-08 DIAGNOSIS — M25512 Pain in left shoulder: Secondary | ICD-10-CM | POA: Diagnosis not present

## 2016-03-08 DIAGNOSIS — M542 Cervicalgia: Secondary | ICD-10-CM | POA: Diagnosis not present

## 2016-03-21 DIAGNOSIS — I10 Essential (primary) hypertension: Secondary | ICD-10-CM | POA: Diagnosis not present

## 2016-03-21 DIAGNOSIS — Z23 Encounter for immunization: Secondary | ICD-10-CM | POA: Diagnosis not present

## 2016-03-21 DIAGNOSIS — Z111 Encounter for screening for respiratory tuberculosis: Secondary | ICD-10-CM | POA: Diagnosis not present

## 2016-03-23 DIAGNOSIS — Z1231 Encounter for screening mammogram for malignant neoplasm of breast: Secondary | ICD-10-CM | POA: Diagnosis not present

## 2016-04-03 DIAGNOSIS — M25512 Pain in left shoulder: Secondary | ICD-10-CM | POA: Diagnosis not present

## 2016-04-06 ENCOUNTER — Other Ambulatory Visit: Payer: Self-pay

## 2016-04-11 DIAGNOSIS — Z23 Encounter for immunization: Secondary | ICD-10-CM | POA: Diagnosis not present

## 2016-04-17 DIAGNOSIS — R6882 Decreased libido: Secondary | ICD-10-CM | POA: Diagnosis not present

## 2016-04-17 DIAGNOSIS — M25512 Pain in left shoulder: Secondary | ICD-10-CM | POA: Diagnosis not present

## 2016-04-17 DIAGNOSIS — M1711 Unilateral primary osteoarthritis, right knee: Secondary | ICD-10-CM | POA: Diagnosis not present

## 2016-04-17 DIAGNOSIS — M19012 Primary osteoarthritis, left shoulder: Secondary | ICD-10-CM | POA: Diagnosis not present

## 2016-06-12 DIAGNOSIS — R6882 Decreased libido: Secondary | ICD-10-CM | POA: Diagnosis not present

## 2016-06-22 DIAGNOSIS — M1711 Unilateral primary osteoarthritis, right knee: Secondary | ICD-10-CM | POA: Diagnosis not present

## 2016-07-04 DIAGNOSIS — M1711 Unilateral primary osteoarthritis, right knee: Secondary | ICD-10-CM | POA: Diagnosis not present

## 2016-07-13 DIAGNOSIS — M1711 Unilateral primary osteoarthritis, right knee: Secondary | ICD-10-CM | POA: Diagnosis not present

## 2016-07-13 DIAGNOSIS — Z791 Long term (current) use of non-steroidal anti-inflammatories (NSAID): Secondary | ICD-10-CM | POA: Diagnosis not present

## 2016-07-18 DIAGNOSIS — M238X1 Other internal derangements of right knee: Secondary | ICD-10-CM | POA: Diagnosis not present

## 2016-08-03 DIAGNOSIS — G8918 Other acute postprocedural pain: Secondary | ICD-10-CM | POA: Diagnosis not present

## 2016-08-03 DIAGNOSIS — M23300 Other meniscus derangements, unspecified lateral meniscus, right knee: Secondary | ICD-10-CM | POA: Diagnosis not present

## 2016-08-03 DIAGNOSIS — M23303 Other meniscus derangements, unspecified medial meniscus, right knee: Secondary | ICD-10-CM | POA: Diagnosis not present

## 2016-08-03 DIAGNOSIS — M23241 Derangement of anterior horn of lateral meniscus due to old tear or injury, right knee: Secondary | ICD-10-CM | POA: Diagnosis not present

## 2016-08-03 DIAGNOSIS — S83242A Other tear of medial meniscus, current injury, left knee, initial encounter: Secondary | ICD-10-CM | POA: Diagnosis not present

## 2016-08-03 DIAGNOSIS — M67261 Synovial hypertrophy, not elsewhere classified, right lower leg: Secondary | ICD-10-CM | POA: Diagnosis not present

## 2016-08-03 DIAGNOSIS — M23221 Derangement of posterior horn of medial meniscus due to old tear or injury, right knee: Secondary | ICD-10-CM | POA: Diagnosis not present

## 2016-08-03 DIAGNOSIS — M659 Synovitis and tenosynovitis, unspecified: Secondary | ICD-10-CM | POA: Diagnosis not present

## 2016-08-17 DIAGNOSIS — M1711 Unilateral primary osteoarthritis, right knee: Secondary | ICD-10-CM | POA: Diagnosis not present

## 2016-08-17 DIAGNOSIS — M238X1 Other internal derangements of right knee: Secondary | ICD-10-CM | POA: Diagnosis not present

## 2016-08-17 DIAGNOSIS — Z9889 Other specified postprocedural states: Secondary | ICD-10-CM | POA: Diagnosis not present

## 2016-09-07 DIAGNOSIS — M791 Myalgia: Secondary | ICD-10-CM | POA: Diagnosis not present

## 2016-09-07 DIAGNOSIS — A0839 Other viral enteritis: Secondary | ICD-10-CM | POA: Diagnosis not present

## 2016-09-08 DIAGNOSIS — Z4789 Encounter for other orthopedic aftercare: Secondary | ICD-10-CM | POA: Diagnosis not present

## 2016-09-08 DIAGNOSIS — M1711 Unilateral primary osteoarthritis, right knee: Secondary | ICD-10-CM | POA: Diagnosis not present

## 2016-10-02 DIAGNOSIS — Z4789 Encounter for other orthopedic aftercare: Secondary | ICD-10-CM | POA: Diagnosis not present

## 2016-10-02 DIAGNOSIS — M1711 Unilateral primary osteoarthritis, right knee: Secondary | ICD-10-CM | POA: Diagnosis not present

## 2016-10-16 DIAGNOSIS — M1711 Unilateral primary osteoarthritis, right knee: Secondary | ICD-10-CM | POA: Diagnosis not present

## 2016-10-16 DIAGNOSIS — Z4789 Encounter for other orthopedic aftercare: Secondary | ICD-10-CM | POA: Diagnosis not present

## 2016-11-06 DIAGNOSIS — M1711 Unilateral primary osteoarthritis, right knee: Secondary | ICD-10-CM | POA: Diagnosis not present

## 2016-11-13 DIAGNOSIS — M1711 Unilateral primary osteoarthritis, right knee: Secondary | ICD-10-CM | POA: Diagnosis not present

## 2016-11-20 DIAGNOSIS — M1711 Unilateral primary osteoarthritis, right knee: Secondary | ICD-10-CM | POA: Diagnosis not present

## 2016-11-23 DIAGNOSIS — H26493 Other secondary cataract, bilateral: Secondary | ICD-10-CM | POA: Diagnosis not present

## 2016-11-23 DIAGNOSIS — H04123 Dry eye syndrome of bilateral lacrimal glands: Secondary | ICD-10-CM | POA: Diagnosis not present

## 2016-11-28 DIAGNOSIS — Z23 Encounter for immunization: Secondary | ICD-10-CM | POA: Diagnosis not present

## 2016-11-28 DIAGNOSIS — I10 Essential (primary) hypertension: Secondary | ICD-10-CM | POA: Diagnosis not present

## 2016-11-28 DIAGNOSIS — R7303 Prediabetes: Secondary | ICD-10-CM | POA: Diagnosis not present

## 2016-11-28 DIAGNOSIS — E663 Overweight: Secondary | ICD-10-CM | POA: Diagnosis not present

## 2016-11-28 DIAGNOSIS — Z6831 Body mass index (BMI) 31.0-31.9, adult: Secondary | ICD-10-CM | POA: Diagnosis not present

## 2016-11-28 DIAGNOSIS — J069 Acute upper respiratory infection, unspecified: Secondary | ICD-10-CM | POA: Diagnosis not present

## 2016-11-28 DIAGNOSIS — R7301 Impaired fasting glucose: Secondary | ICD-10-CM | POA: Diagnosis not present

## 2016-12-07 DIAGNOSIS — B029 Zoster without complications: Secondary | ICD-10-CM | POA: Diagnosis not present

## 2017-01-02 DIAGNOSIS — M1711 Unilateral primary osteoarthritis, right knee: Secondary | ICD-10-CM | POA: Diagnosis not present

## 2017-01-04 DIAGNOSIS — M25561 Pain in right knee: Secondary | ICD-10-CM | POA: Diagnosis not present

## 2017-01-04 DIAGNOSIS — R2689 Other abnormalities of gait and mobility: Secondary | ICD-10-CM | POA: Diagnosis not present

## 2017-01-04 DIAGNOSIS — M25661 Stiffness of right knee, not elsewhere classified: Secondary | ICD-10-CM | POA: Diagnosis not present

## 2017-01-08 DIAGNOSIS — M25561 Pain in right knee: Secondary | ICD-10-CM | POA: Diagnosis not present

## 2017-01-08 DIAGNOSIS — R2689 Other abnormalities of gait and mobility: Secondary | ICD-10-CM | POA: Diagnosis not present

## 2017-01-08 DIAGNOSIS — M25661 Stiffness of right knee, not elsewhere classified: Secondary | ICD-10-CM | POA: Diagnosis not present

## 2017-01-11 DIAGNOSIS — R2689 Other abnormalities of gait and mobility: Secondary | ICD-10-CM | POA: Diagnosis not present

## 2017-01-11 DIAGNOSIS — M25661 Stiffness of right knee, not elsewhere classified: Secondary | ICD-10-CM | POA: Diagnosis not present

## 2017-01-11 DIAGNOSIS — M25561 Pain in right knee: Secondary | ICD-10-CM | POA: Diagnosis not present

## 2017-01-15 DIAGNOSIS — M25561 Pain in right knee: Secondary | ICD-10-CM | POA: Diagnosis not present

## 2017-01-15 DIAGNOSIS — M25661 Stiffness of right knee, not elsewhere classified: Secondary | ICD-10-CM | POA: Diagnosis not present

## 2017-01-15 DIAGNOSIS — R2689 Other abnormalities of gait and mobility: Secondary | ICD-10-CM | POA: Diagnosis not present

## 2017-01-18 DIAGNOSIS — M25661 Stiffness of right knee, not elsewhere classified: Secondary | ICD-10-CM | POA: Diagnosis not present

## 2017-01-18 DIAGNOSIS — M25561 Pain in right knee: Secondary | ICD-10-CM | POA: Diagnosis not present

## 2017-01-18 DIAGNOSIS — R2689 Other abnormalities of gait and mobility: Secondary | ICD-10-CM | POA: Diagnosis not present

## 2017-01-22 DIAGNOSIS — M25561 Pain in right knee: Secondary | ICD-10-CM | POA: Diagnosis not present

## 2017-01-22 DIAGNOSIS — R2689 Other abnormalities of gait and mobility: Secondary | ICD-10-CM | POA: Diagnosis not present

## 2017-01-22 DIAGNOSIS — M25661 Stiffness of right knee, not elsewhere classified: Secondary | ICD-10-CM | POA: Diagnosis not present

## 2017-01-25 DIAGNOSIS — M25661 Stiffness of right knee, not elsewhere classified: Secondary | ICD-10-CM | POA: Diagnosis not present

## 2017-01-25 DIAGNOSIS — R2689 Other abnormalities of gait and mobility: Secondary | ICD-10-CM | POA: Diagnosis not present

## 2017-01-25 DIAGNOSIS — M25561 Pain in right knee: Secondary | ICD-10-CM | POA: Diagnosis not present

## 2017-02-19 DIAGNOSIS — M1711 Unilateral primary osteoarthritis, right knee: Secondary | ICD-10-CM | POA: Diagnosis not present

## 2017-03-01 ENCOUNTER — Encounter (HOSPITAL_COMMUNITY): Payer: Self-pay | Admitting: *Deleted

## 2017-03-01 DIAGNOSIS — R7301 Impaired fasting glucose: Secondary | ICD-10-CM | POA: Diagnosis not present

## 2017-03-01 DIAGNOSIS — E782 Mixed hyperlipidemia: Secondary | ICD-10-CM | POA: Diagnosis not present

## 2017-03-01 DIAGNOSIS — Z01818 Encounter for other preprocedural examination: Secondary | ICD-10-CM | POA: Diagnosis not present

## 2017-03-01 DIAGNOSIS — Z96659 Presence of unspecified artificial knee joint: Secondary | ICD-10-CM | POA: Diagnosis not present

## 2017-03-01 DIAGNOSIS — R7303 Prediabetes: Secondary | ICD-10-CM | POA: Diagnosis not present

## 2017-03-01 DIAGNOSIS — Z471 Aftercare following joint replacement surgery: Secondary | ICD-10-CM | POA: Diagnosis not present

## 2017-03-01 DIAGNOSIS — I10 Essential (primary) hypertension: Secondary | ICD-10-CM | POA: Diagnosis not present

## 2017-03-01 DIAGNOSIS — Z01810 Encounter for preprocedural cardiovascular examination: Secondary | ICD-10-CM | POA: Diagnosis not present

## 2017-03-01 DIAGNOSIS — N3000 Acute cystitis without hematuria: Secondary | ICD-10-CM | POA: Diagnosis not present

## 2017-03-02 DIAGNOSIS — M1711 Unilateral primary osteoarthritis, right knee: Secondary | ICD-10-CM | POA: Diagnosis not present

## 2017-03-12 DIAGNOSIS — N3000 Acute cystitis without hematuria: Secondary | ICD-10-CM | POA: Diagnosis not present

## 2017-03-12 DIAGNOSIS — E875 Hyperkalemia: Secondary | ICD-10-CM | POA: Diagnosis not present

## 2017-03-12 DIAGNOSIS — Z01810 Encounter for preprocedural cardiovascular examination: Secondary | ICD-10-CM | POA: Diagnosis not present

## 2017-03-15 ENCOUNTER — Encounter (HOSPITAL_COMMUNITY): Payer: Self-pay

## 2017-03-15 NOTE — Patient Instructions (Addendum)
Denise [name]  03/15/2017   Your procedure is scheduled on: 03/28/2017    Report to [facility] Main  Entrance Take [place]  elevators to 3rd floor to  [place] at   [place] AM.    Call this number if you have problems the morning of surgery [phone]    Remember: ONLY 1 PERSON MAY GO WITH YOU TO SHORT STAY TO GET  READY MORNING OF [place].  Do not eat food or drink liquids :After Midnight.     Take these medicines the morning of surgery with A SIP OF WATER: NONE                                You may not have any metal on your body including hair pins and              piercings  Do not wear jewelry, make-up, lotions, powders or perfumes, deodorant             Do not wear nail polish.  Do not shave  48 hours prior to surgery.                Do not bring valuables to the hospital. [place].  Contacts, dentures or bridgework may not be worn into surgery.  Leave suitcase in the car. After surgery it may be brought to your room.                   Please read over the following fact sheets you were given: _____________________________________________________________________             [facility] - Preparing for Surgery Before surgery, you can play an important role.  Because skin is not sterile, your skin needs to be as free of germs as possible.  You can reduce the number of germs on your skin by washing with CHG (chlorahexidine gluconate) soap before surgery.  CHG is an antiseptic cleaner which kills germs and bonds with the skin to continue killing germs even after washing. Please DO NOT use if you have an allergy to CHG or antibacterial soaps.  If your skin becomes reddened/irritated stop using the CHG and inform your nurse when you arrive at Short Stay. Do not shave (including legs and underarms) for at least 48 hours prior to the first CHG shower.  You may shave your face/neck. Please  follow these instructions carefully:  1.  Shower with CHG Soap the night before surgery and the  morning of Surgery.  2.  If you choose to wash your hair, wash your hair first as usual with your  normal  shampoo.  3.  After you shampoo, rinse your hair and body thoroughly to remove the  shampoo.                           4.  Use CHG as you would any other liquid soap.  You can apply chg directly  to the skin and wash                       Gently with a scrungie or clean washcloth.  5.  Apply the  CHG Soap to your body ONLY FROM THE NECK DOWN.   Do not use on face/ open                           Wound or open sores. Avoid contact with eyes, ears mouth and genitals (private parts).                       Wash face,  Genitals (private parts) with your normal soap.             6.  Wash thoroughly, paying special attention to the area where your surgery  will be performed.  7.  Thoroughly rinse your body with warm water from the neck down.  8.  DO NOT shower/wash with your normal soap after using and rinsing off  the CHG Soap.                9.  Pat yourself dry with a clean towel.            10.  Wear clean pajamas.            11.  Place clean sheets on your bed the night of your first shower and do not  sleep with pets. Day of Surgery : Do not apply any lotions/deodorants the morning of surgery.  Please wear clean clothes to the hospital/surgery center.  FAILURE TO FOLLOW THESE INSTRUCTIONS MAY RESULT IN THE CANCELLATION OF YOUR SURGERY PATIENT SIGNATURE_________________________________  NURSE SIGNATURE__________________________________  ________________________________________________________________________  WHAT IS A BLOOD TRANSFUSION? Blood Transfusion Information  A transfusion is the replacement of blood or some of its parts. Blood is made up of multiple cells which provide different functions.  Red blood cells carry oxygen and are used for blood loss replacement.  White blood cells  fight against infection.  Platelets control bleeding.  Plasma helps clot blood.  Other blood products are available for specialized needs, such as hemophilia or other clotting disorders. BEFORE THE TRANSFUSION  Who gives blood for transfusions?   Healthy volunteers who are fully evaluated to make sure their blood is safe. This is blood bank blood. Transfusion therapy is the safest it has ever been in the practice of medicine. Before blood is taken from a donor, a complete history is taken to make sure that person has no history of diseases nor engages in risky social behavior (examples are intravenous drug use or sexual activity with multiple partners). The donor's travel history is screened to minimize risk of transmitting infections, such as malaria. The donated blood is tested for signs of infectious diseases, such as HIV and hepatitis. The blood is then tested to be sure it is compatible with you in order to minimize the chance of a transfusion reaction. If you or a relative donates blood, this is often done in anticipation of surgery and is not appropriate for emergency situations. It takes many days to process the donated blood. RISKS AND COMPLICATIONS Although transfusion therapy is very safe and saves many lives, the main dangers of transfusion include:   Getting an infectious disease.  Developing a transfusion reaction. This is an allergic reaction to something in the blood you were given. Every precaution is taken to prevent this. The decision to have a blood transfusion has been considered carefully by your caregiver before blood is given. Blood is not given unless the benefits outweigh the risks. AFTER THE TRANSFUSION  Right after receiving a blood transfusion,  you will usually feel much better and more energetic. This is especially true if your red blood cells have gotten low (anemic). The transfusion raises the level of the red blood cells which carry oxygen, and this usually  causes an energy increase.  The nurse administering the transfusion will monitor you carefully for complications. HOME CARE INSTRUCTIONS  No special instructions are needed after a transfusion. You may find your energy is better. Speak with your caregiver about any limitations on activity for underlying diseases you may have. SEEK MEDICAL CARE IF:   Your condition is not improving after your transfusion.  You develop redness or irritation at the intravenous (IV) site. SEEK IMMEDIATE MEDICAL CARE IF:  Any of the following symptoms occur over the next 12 hours:  Shaking chills.  You have a temperature by mouth above 102 F (38.9 C), not controlled by medicine.  Chest, back, or muscle pain.  People around you feel you are not acting correctly or are confused.  Shortness of breath or difficulty breathing.  Dizziness and fainting.  You get a rash or develop hives.  You have a decrease in urine output.  Your urine turns a dark color or changes to pink, red, or brown. Any of the following symptoms occur over the next 10 days:  You have a temperature by mouth above 102 F (38.9 C), not controlled by medicine.  Shortness of breath.  Weakness after normal activity.  The white part of the eye turns yellow (jaundice).  You have a decrease in the amount of urine or are urinating less often.  Your urine turns a dark color or changes to pink, red, or brown. Document Released: 07/21/2000 Document Revised: 10/16/2011 Document Reviewed: 03/09/2008 ExitCare Patient Information 2014 [place].  _______________________________________________________________________  Incentive Spirometer  An incentive spirometer is a tool that can help keep your lungs clear and active. This tool measures how well you are filling your lungs with each breath. Taking long deep breaths may help reverse or decrease the chance of developing breathing (pulmonary) problems (especially infection)  following:  A long period of time when you are unable to move or be active. BEFORE THE PROCEDURE   If the spirometer includes an indicator to show your best effort, your nurse or respiratory therapist will set it to a desired goal.  If possible, sit up straight or lean slightly forward. Try not to slouch.  Hold the incentive spirometer in an upright position. INSTRUCTIONS FOR USE  1. Sit on the edge of your bed if possible, or sit up as far as you can in bed or on a chair. 2. Hold the incentive spirometer in an upright position. 3. Breathe out normally. 4. Place the mouthpiece in your mouth and seal your lips tightly around it. 5. Breathe in slowly and as deeply as possible, raising the piston or the ball toward the top of the column. 6. Hold your breath for 3-5 seconds or for as long as possible. Allow the piston or ball to fall to the bottom of the column. 7. Remove the mouthpiece from your mouth and breathe out normally. 8. Rest for a few seconds and repeat Steps 1 through 7 at least 10 times every 1-2 hours when you are awake. Take your time and take a few normal breaths between deep breaths. 9. The spirometer may include an indicator to show your best effort. Use the indicator as a goal to work toward during each repetition. 10. After each set of 10 deep breaths,  practice coughing to be sure your lungs are clear. If you have an incision (the cut made at the time of surgery), support your incision when coughing by placing a pillow or rolled up towels firmly against it. Once you are able to get out of bed, walk around indoors and cough well. You may stop using the incentive spirometer when instructed by your caregiver.  RISKS AND COMPLICATIONS  Take your time so you do not get dizzy or light-headed.  If you are in pain, you may need to take or ask for pain medication before doing incentive spirometry. It is harder to take a deep breath if you are having pain. AFTER USE  Rest and  breathe slowly and easily.  It can be helpful to keep track of a log of your progress. Your caregiver can provide you with a simple table to help with this. If you are using the spirometer at home, follow these instructions: [place] IF:   You are having difficultly using the spirometer.  You have trouble using the spirometer as often as instructed.  Your pain medication is not giving enough relief while using the spirometer.  You develop fever of 100.5 F (38.1 C) or higher. SEEK IMMEDIATE MEDICAL CARE IF:   You cough up bloody sputum that had not been present before.  You develop fever of 102 F (38.9 C) or greater.  You develop worsening pain at or near the incision site. MAKE SURE YOU:   Understand these instructions.  Will watch your condition.  Will get help right away if you are not doing well or get worse. Document Released: 12/04/2006 Document Revised: 10/16/2011 Document Reviewed: 02/04/2007 [facility] Patient Information 2014 [place], [place].   ________________________________________________________________________

## 2017-03-15 NOTE — Progress Notes (Signed)
Called and left vice mail message for Denise [name] at [facility] to see if cbc/diff/cmp and cxr and ekg need to be repeated since just done on 03/01/2017.

## 2017-03-20 ENCOUNTER — Encounter (HOSPITAL_COMMUNITY): Payer: Self-pay

## 2017-03-20 ENCOUNTER — Encounter (HOSPITAL_COMMUNITY)
Admission: RE | Admit: 2017-03-20 | Discharge: 2017-03-20 | Disposition: A | Discharge status: Home / Self Care | Payer: Medicare Other | Source: Ambulatory Visit | Attending: Orthopedic Surgery | Admitting: Orthopedic Surgery

## 2017-03-20 DIAGNOSIS — Z01818 Encounter for other preprocedural examination: Secondary | ICD-10-CM | POA: Insufficient documentation

## 2017-03-20 DIAGNOSIS — M1711 Unilateral primary osteoarthritis, right knee: Secondary | ICD-10-CM | POA: Insufficient documentation

## 2017-03-20 HISTORY — DX: Pneumonia, unspecified organism: J18.9

## 2017-03-20 LAB — URINALYSIS, ROUTINE W REFLEX MICROSCOPIC
Bilirubin Urine: NEGATIVE
Glucose, UA: NEGATIVE mg/dL
Hgb urine dipstick: NEGATIVE
Ketones, ur: NEGATIVE mg/dL
Nitrite: NEGATIVE
Protein, ur: NEGATIVE mg/dL
Specific Gravity, Urine: 1.027 (ref 1.005–1.030)
pH: 5 (ref 5.0–8.0)

## 2017-03-20 LAB — PROTIME-INR
INR: 0.94
Prothrombin Time: 12.6 seconds (ref 11.4–15.2)

## 2017-03-20 LAB — APTT: aPTT: 29 seconds (ref 24–36)

## 2017-03-20 LAB — ABO/RH: ABO/RH(D): A POS

## 2017-03-20 NOTE — Progress Notes (Signed)
Clearance Dr. Tobie Poet 03/01/17 lov 02/15/17 Dr. Tobie Poet Stress 2016 epic ekg 03/01/17 chart 03/01/17 cbc chart cmp 03/12/17 on chart  hgb a1 c chart cxr 03/01/17 chart  MRSA pcr on chart

## 2017-03-27 NOTE — Anesthesia Preprocedure Evaluation (Addendum)
Anesthesia Evaluation  Patient identified by MRN, date of birth, ID band Patient awake    Reviewed: Allergy & Precautions, NPO status , Patient's Chart, lab work & pertinent test results  History of Anesthesia Complications Negative for: history of anesthetic complications  Airway Mallampati: II  TM Distance: >3 FB Neck ROM: Full    Dental  (+) Dental Advisory Given   Pulmonary neg pulmonary ROS,    breath sounds clear to auscultation       Cardiovascular hypertension, Pt. on medications (-) angina Rhythm:Regular Rate:Normal  '16 Nuclear stress EF: 72%. no ST segment deviation noted during stress. medium defect of mild severity present in the mid anterior, mid anteroseptal and apical septal location, consistent with breast attenuation artifact.  The study is normal.  This is a low risk study   Neuro/Psych negative neurological ROS     GI/Hepatic Neg liver ROS, GERD  Medicated and Controlled,  Endo/Other  Morbid obesity  Renal/GU negative Renal ROS     Musculoskeletal  (+) Arthritis , Osteoarthritis,    Abdominal (+) + obese,   Peds  Hematology negative hematology ROS (+)   Anesthesia Other Findings   Reproductive/Obstetrics                            Anesthesia Physical Anesthesia Plan  ASA: II  Anesthesia Plan: General   Post-op Pain Management: GA combined w/ Regional for post-op pain   Induction: Intravenous  PONV Risk Score and Plan: 4 or greater and Ondansetron, Dexamethasone, Midazolam and Scopolamine patch - Pre-op  Airway Management Planned: LMA  Additional Equipment:   Intra-op Plan:   Post-operative Plan: Extubation in OR  Informed Consent: I have reviewed the patients History and Physical, chart, labs and discussed the procedure including the risks, benefits and alternatives for the proposed anesthesia with the patient or authorized representative who has  indicated his/her understanding and acceptance.   Dental advisory given  Plan Discussed with: CRNA and Surgeon  Anesthesia Plan Comments: (Plan routine monitors, GA- LMA OK, with adductor canal block for post op analgesia (pt refused SAB))       Anesthesia Quick Evaluation

## 2017-03-27 NOTE — H&P (Signed)
TOTAL KNEE ADMISSION H&P  Patient is being admitted for right total knee arthroplasty.  Subjective:  Chief Complaint:right knee pain.  HPI: Denise [name], 71 y.o. female, has a history of pain and functional disability in the right knee due to arthritis and has failed non-surgical conservative treatments for greater than 12 weeks to includeNSAID's and/or analgesics, corticosteriod injections, flexibility and strengthening excercises, use of assistive devices and activity modification.  Onset of symptoms was gradual, starting 3 years ago with gradually worsening course since that time. The patient noted prior procedures on the knee to include  arthroscopy and menisectomy on the right knee(s).  Patient currently rates pain in the right knee(s) at 8 out of 10 with activity. Patient has night pain, worsening of pain with activity and weight bearing, pain that interferes with activities of daily living, pain with passive range of motion, crepitus and joint swelling.  Patient has evidence of periarticular osteophytes and joint space narrowing by imaging studies. There is no active infection.  Patient Active Problem List   Diagnosis Date Noted  . Chest pain 06/15/2015  . HTN (hypertension) 06/15/2015  . DOE (dyspnea on exertion) 06/15/2015  . Periodic limb movement disorder (PLMD) 05/26/2015  . Dyspnea 04/08/2015  . Chronic cough 04/08/2015  . Chest discomfort 04/08/2015  . NAUSEA ALONE 01/06/2010  . ABDOMINAL PAIN RIGHT LOWER QUADRANT 01/06/2010  . ABDOMINAL PAIN, PERIUMBILIC 16/05/9603  . ABNORMAL EXAM-BILIARY TRACT 01/06/2010  . ABNORMAL FINDINGS GI TRACT 01/06/2010   Past Medical History:  Diagnosis Date  . Arthritis   . Constipation   . HTN (hypertension) 06/15/2015  . Pneumonia     Past Surgical History:  Procedure Laterality Date  . ANKLE SURGERY    . BREAST LUMPECTOMY     5 times  . CARPAL TUNNEL RELEASE     10/2015  . CATARACT EXTRACTION    . CHOLECYSTECTOMY    . NASAL  SINUS SURGERY    . right knee arthroscopic    . ROTATOR CUFF REPAIR    . TUBAL LIGATION       Current Outpatient Prescriptions:  .  Calcium Carb-Cholecalciferol (CALCIUM 600 + D PO), Take 1 tablet by mouth daily., Disp: , Rfl:  .  clobetasol cream (TEMOVATE) 5.40 %, Apply 1 application topically See admin instructions. USES AS NEEDED, PUTS ON EVERY TIME SHE GOES TO THE BATHROOM WHEN LICHEN SCLEROSUS IS ACTING UP, Disp: , Rfl:  .  diphenhydramine-acetaminophen (TYLENOL PM) 25-500 MG TABS tablet, Take 1 tablet by mouth at bedtime as needed (SLEEP)., Disp: , Rfl:  .  meloxicam (MOBIC) 15 MG tablet, Take 15 mg by mouth daily. , Disp: , Rfl:  .  Multiple Vitamin (MULTIVITAMIN) tablet, Take 1 tablet by mouth daily.  , Disp: , Rfl:  .  PREMARIN vaginal cream, Place 1 application vaginally daily as needed. DRYNESS , Disp: , Rfl: 4 .  telmisartan (MICARDIS) 40 MG tablet, Take 40 mg by mouth daily., Disp: , Rfl:  .  traMADol (ULTRAM) 50 MG tablet, Take 50 mg by mouth 2 (two) times daily as needed. , Disp: , Rfl:  .  vitamin C (ASCORBIC ACID) 500 MG tablet, Take 500 mg by mouth daily., Disp: , Rfl:  .  omeprazole (PRILOSEC) 20 MG capsule, Take 1 capsule (20 mg total) by mouth daily. (Patient not taking: Reported on 03/16/2017), Disp: 30 capsule, Rfl: 5  No Known Allergies  Social History  Substance Use Topics  . Smoking status: Never Smoker  . Smokeless tobacco:  Never Used  . Alcohol use No    Family History  Problem Relation Age of Onset  . Liver disease Father   . Cirrhosis Father   . Bone cancer Mother   . Breast cancer Unknown        aunts  . Colon cancer Neg Hx      Review of Systems  Constitutional: Negative.   HENT: Positive for tinnitus. Negative for congestion, ear discharge, ear pain, hearing loss, nosebleeds, sinus pain and sore throat.   Eyes: Negative.   Respiratory: Positive for shortness of breath. Negative for cough, hemoptysis, sputum production, wheezing and stridor.    Cardiovascular: Negative.   Gastrointestinal: Negative.   Genitourinary: Negative.   Musculoskeletal: Positive for back pain, joint pain and myalgias. Negative for falls and neck pain.  Skin: Negative.   Neurological: Negative.   Endo/Heme/Allergies: Negative.   Psychiatric/Behavioral: Negative.     Objective:  Physical Exam  Constitutional: She is oriented to person, place, and time. She appears well-developed. No distress.  Obese  HENT:  Head: Normocephalic and atraumatic.  Right Ear: External ear normal.  Left Ear: External ear normal.  Nose: Nose normal.  Mouth/Throat: Oropharynx is clear and moist.  Eyes: Conjunctivae and EOM are normal.  Neck: Normal range of motion. Neck supple.  Cardiovascular: Normal rate, regular rhythm, normal heart sounds and intact distal pulses.   No murmur heard. Respiratory: Effort normal and breath sounds normal. No respiratory distress. She has no wheezes.  GI: Soft. Bowel sounds are normal. She exhibits no distension. There is no tenderness.  Musculoskeletal:       Right hip: Normal.       Left hip: Normal.       Right knee: She exhibits decreased range of motion and swelling. She exhibits no effusion and no erythema. Tenderness found. Medial joint line and lateral joint line tenderness noted.       Left knee: Normal.  Neurological: She is alert and oriented to person, place, and time. She has normal strength. No sensory deficit.  Skin: No rash noted. She is not diaphoretic. No erythema.  Psychiatric: She has a normal mood and affect. Her behavior is normal.    Vitals  Weight: 170 lb Height: 62in Body Surface Area: 1.78 m Body Mass Index: 31.09 kg/m  Pulse: 80 (Regular)  BP: 152/88 (Sitting, Left Arm, Standard)   Imaging Review Plain radiographs demonstrate severe degenerative joint disease of the right knee(s). The overall alignment ismild varus. The bone quality appears to be good for age and reported activity  level.  Assessment/Plan:  End stage primary osteoarthritis, right knee   The patient history, physical examination, clinical judgment of the provider and imaging studies are consistent with end stage degenerative joint disease of the right knee(s) and total knee arthroplasty is deemed medically necessary. The treatment options including medical management, injection therapy arthroscopy and arthroplasty were discussed at length. The risks and benefits of total knee arthroplasty were presented and reviewed. The risks due to aseptic loosening, infection, stiffness, patella tracking problems, thromboembolic complications and other imponderables were discussed. The patient acknowledged the explanation, agreed to proceed with the plan and consent was signed. Patient is being admitted for inpatient treatment for surgery, pain control, PT, OT, prophylactic antibiotics, VTE prophylaxis, progressive ambulation and ADL's and discharge planning. The patient is planning to be discharged home and to outpatient therapy.    PCP: Dr. Leonarda Salon Therapy Plans: [place] on 8/26 Home with husband and daughter DME: has  walker; has elevated toilets Other: no previous anethesia concerns   [employer], PA-C

## 2017-03-28 ENCOUNTER — Encounter (HOSPITAL_COMMUNITY): Payer: Self-pay | Admitting: *Deleted

## 2017-03-28 ENCOUNTER — Inpatient Hospital Stay (HOSPITAL_COMMUNITY): Payer: Medicare Other | Admitting: Anesthesiology

## 2017-03-28 ENCOUNTER — Inpatient Hospital Stay (HOSPITAL_COMMUNITY)
Admission: RE | Admit: 2017-03-28 | Discharge: 2017-03-30 | DRG: 470 | Disposition: A | Discharge status: Home / Self Care | Payer: Medicare Other | Source: Ambulatory Visit | Attending: Orthopedic Surgery | Admitting: Orthopedic Surgery

## 2017-03-28 ENCOUNTER — Encounter (HOSPITAL_COMMUNITY)
Admission: RE | Disposition: A | Discharge status: Home / Self Care | Payer: Self-pay | Source: Ambulatory Visit | Attending: Orthopedic Surgery

## 2017-03-28 DIAGNOSIS — R079 Chest pain, unspecified: Secondary | ICD-10-CM | POA: Diagnosis not present

## 2017-03-28 DIAGNOSIS — M1711 Unilateral primary osteoarthritis, right knee: Secondary | ICD-10-CM | POA: Diagnosis present

## 2017-03-28 DIAGNOSIS — I10 Essential (primary) hypertension: Secondary | ICD-10-CM | POA: Diagnosis present

## 2017-03-28 DIAGNOSIS — Z6831 Body mass index (BMI) 31.0-31.9, adult: Secondary | ICD-10-CM | POA: Diagnosis not present

## 2017-03-28 DIAGNOSIS — G8918 Other acute postprocedural pain: Secondary | ICD-10-CM | POA: Diagnosis not present

## 2017-03-28 DIAGNOSIS — K219 Gastro-esophageal reflux disease without esophagitis: Secondary | ICD-10-CM | POA: Diagnosis present

## 2017-03-28 DIAGNOSIS — Z96651 Presence of right artificial knee joint: Secondary | ICD-10-CM

## 2017-03-28 DIAGNOSIS — M24561 Contracture, right knee: Secondary | ICD-10-CM | POA: Diagnosis present

## 2017-03-28 HISTORY — PX: TOTAL KNEE ARTHROPLASTY: SHX125

## 2017-03-28 LAB — TYPE AND SCREEN
ABO/RH(D): A POS
Antibody Screen: NEGATIVE

## 2017-03-28 SURGERY — ARTHROPLASTY, KNEE, TOTAL
Anesthesia: General | Site: Knee | Laterality: Right

## 2017-03-28 MED ORDER — FLEET ENEMA 7-19 GM/118ML RE ENEM: 1.0000 | ENEMA | Freq: Once | RECTAL | Status: DC | PRN

## 2017-03-28 MED ORDER — FERROUS SULFATE 325 (65 FE) MG PO TABS
325.0000 mg | ORAL_TABLET | Freq: Three times a day (TID) | ORAL | Status: DC
  Administered 2017-03-29 – 2017-03-30 (×3): 325 mg via ORAL
  Filled 2017-03-28 (×3): qty 1

## 2017-03-28 MED ORDER — HYDROMORPHONE HCL-NACL 0.5-0.9 MG/ML-% IV SOSY
PREFILLED_SYRINGE | INTRAVENOUS | Status: AC
  Administered 2017-03-28: 0.5 mg via INTRAVENOUS
  Filled 2017-03-28: qty 2

## 2017-03-28 MED ORDER — MENTHOL 3 MG MT LOZG: 1.0000 | LOZENGE | OROMUCOSAL | Status: DC | PRN

## 2017-03-28 MED ORDER — BUPIVACAINE LIPOSOME 1.3 % IJ SUSP
20.0000 mL | Freq: Once | INTRAMUSCULAR | Status: DC
  Filled 2017-03-28: qty 20

## 2017-03-28 MED ORDER — CEFAZOLIN SODIUM-DEXTROSE 1-4 GM/50ML-% IV SOLN
1.0000 g | Freq: Four times a day (QID) | INTRAVENOUS | Status: AC
  Administered 2017-03-28 (×2): 1 g via INTRAVENOUS
  Filled 2017-03-28 (×2): qty 50

## 2017-03-28 MED ORDER — FENTANYL CITRATE (PF) 250 MCG/5ML IJ SOLN
INTRAMUSCULAR | Status: DC | PRN
  Administered 2017-03-28: 25 ug via INTRAVENOUS
  Administered 2017-03-28: 50 ug via INTRAVENOUS
  Administered 2017-03-28 (×2): 25 ug via INTRAVENOUS
  Administered 2017-03-28: 150 ug via INTRAVENOUS
  Administered 2017-03-28 (×2): 25 ug via INTRAVENOUS
  Administered 2017-03-28: 50 ug via INTRAVENOUS
  Administered 2017-03-28 (×2): 25 ug via INTRAVENOUS
  Administered 2017-03-28: 50 ug via INTRAVENOUS
  Administered 2017-03-28: 25 ug via INTRAVENOUS

## 2017-03-28 MED ORDER — METHOCARBAMOL 1000 MG/10ML IJ SOLN
500.0000 mg | Freq: Four times a day (QID) | INTRAMUSCULAR | Status: DC | PRN
  Administered 2017-03-28: 500 mg via INTRAVENOUS
  Filled 2017-03-28: qty 550

## 2017-03-28 MED ORDER — CELECOXIB 200 MG PO CAPS
200.0000 mg | ORAL_CAPSULE | Freq: Two times a day (BID) | ORAL | Status: DC
  Administered 2017-03-29 – 2017-03-30 (×3): 200 mg via ORAL
  Filled 2017-03-28 (×3): qty 1

## 2017-03-28 MED ORDER — DEXAMETHASONE SODIUM PHOSPHATE 10 MG/ML IJ SOLN
INTRAMUSCULAR | Status: AC
  Filled 2017-03-28: qty 1

## 2017-03-28 MED ORDER — ACETAMINOPHEN 650 MG RE SUPP: 650.0000 mg | Freq: Four times a day (QID) | RECTAL | Status: DC | PRN

## 2017-03-28 MED ORDER — HYDROMORPHONE HCL-NACL 0.5-0.9 MG/ML-% IV SOSY
PREFILLED_SYRINGE | INTRAVENOUS | Status: AC
  Filled 2017-03-28: qty 2

## 2017-03-28 MED ORDER — BISACODYL 5 MG PO TBEC: 5.0000 mg | DELAYED_RELEASE_TABLET | Freq: Every day | ORAL | Status: DC | PRN

## 2017-03-28 MED ORDER — BUPIVACAINE LIPOSOME 1.3 % IJ SUSP
INTRAMUSCULAR | Status: DC | PRN
  Administered 2017-03-28: 20 mL

## 2017-03-28 MED ORDER — CEFAZOLIN SODIUM-DEXTROSE 2-4 GM/100ML-% IV SOLN
2.0000 g | INTRAVENOUS | Status: AC
  Administered 2017-03-28: 2 g via INTRAVENOUS

## 2017-03-28 MED ORDER — METHOCARBAMOL 500 MG PO TABS
500.0000 mg | ORAL_TABLET | Freq: Four times a day (QID) | ORAL | Status: DC | PRN
  Administered 2017-03-29 – 2017-03-30 (×2): 500 mg via ORAL
  Filled 2017-03-28 (×2): qty 1

## 2017-03-28 MED ORDER — ACETAMINOPHEN 10 MG/ML IV SOLN
INTRAVENOUS | Status: AC
  Filled 2017-03-28: qty 100

## 2017-03-28 MED ORDER — PHENYLEPHRINE HCL 10 MG/ML IJ SOLN
INTRAMUSCULAR | Status: AC
  Filled 2017-03-28: qty 1

## 2017-03-28 MED ORDER — THROMBIN 5000 UNITS EX SOLR
CUTANEOUS | Status: AC
  Filled 2017-03-28: qty 5000

## 2017-03-28 MED ORDER — SODIUM CHLORIDE 0.9 % IV SOLN
INTRAVENOUS | Status: DC | PRN
  Administered 2017-03-28: 500 mL

## 2017-03-28 MED ORDER — PROPOFOL 10 MG/ML IV BOLUS
INTRAVENOUS | Status: AC
  Filled 2017-03-28: qty 20

## 2017-03-28 MED ORDER — HYDROCODONE-ACETAMINOPHEN 5-325 MG PO TABS
1.0000 | ORAL_TABLET | ORAL | Status: DC | PRN
  Administered 2017-03-28: 2 via ORAL
  Administered 2017-03-29: 1 via ORAL
  Administered 2017-03-29 – 2017-03-30 (×5): 2 via ORAL
  Filled 2017-03-28 (×2): qty 2
  Filled 2017-03-28: qty 1
  Filled 2017-03-28 (×4): qty 2
  Filled 2017-03-28: qty 1
  Filled 2017-03-28: qty 2

## 2017-03-28 MED ORDER — MEPERIDINE HCL 50 MG/ML IJ SOLN: 6.2500 mg | INTRAMUSCULAR | Status: DC | PRN

## 2017-03-28 MED ORDER — ACETAMINOPHEN 10 MG/ML IV SOLN: 1000.0000 mg | Freq: Once | INTRAVENOUS | Status: DC

## 2017-03-28 MED ORDER — MIDAZOLAM HCL 2 MG/2ML IJ SOLN
INTRAMUSCULAR | Status: AC
  Administered 2017-03-28: 0.5 mg via INTRAVENOUS
  Filled 2017-03-28: qty 2

## 2017-03-28 MED ORDER — CHLORHEXIDINE GLUCONATE 4 % EX LIQD: 60.0000 mL | Freq: Once | CUTANEOUS | Status: DC

## 2017-03-28 MED ORDER — ONDANSETRON HCL 4 MG/2ML IJ SOLN: 4.0000 mg | Freq: Four times a day (QID) | INTRAMUSCULAR | Status: DC | PRN

## 2017-03-28 MED ORDER — POLYETHYLENE GLYCOL 3350 17 G PO PACK: 17.0000 g | PACK | Freq: Every day | ORAL | Status: DC | PRN

## 2017-03-28 MED ORDER — HYDROMORPHONE HCL-NACL 0.5-0.9 MG/ML-% IV SOSY
0.2500 mg | PREFILLED_SYRINGE | INTRAVENOUS | Status: DC | PRN
  Administered 2017-03-28 (×3): 0.5 mg via INTRAVENOUS

## 2017-03-28 MED ORDER — LIDOCAINE 2% (20 MG/ML) 5 ML SYRINGE
INTRAMUSCULAR | Status: DC | PRN
  Administered 2017-03-28: 60 mg via INTRAVENOUS
  Administered 2017-03-28: 40 mg via INTRAVENOUS

## 2017-03-28 MED ORDER — SODIUM CHLORIDE 0.9 % IJ SOLN
INTRAMUSCULAR | Status: AC
  Filled 2017-03-28: qty 50

## 2017-03-28 MED ORDER — PROPOFOL 10 MG/ML IV BOLUS
INTRAVENOUS | Status: DC | PRN
  Administered 2017-03-28: 200 mg via INTRAVENOUS

## 2017-03-28 MED ORDER — FENTANYL CITRATE (PF) 250 MCG/5ML IJ SOLN
INTRAMUSCULAR | Status: AC
  Filled 2017-03-28: qty 5

## 2017-03-28 MED ORDER — DEXAMETHASONE SODIUM PHOSPHATE 10 MG/ML IJ SOLN
INTRAMUSCULAR | Status: DC | PRN
  Administered 2017-03-28: 10 mg via INTRAVENOUS

## 2017-03-28 MED ORDER — MIDAZOLAM HCL 2 MG/2ML IJ SOLN
INTRAMUSCULAR | Status: AC
  Filled 2017-03-28: qty 2

## 2017-03-28 MED ORDER — ALUM & MAG HYDROXIDE-SIMETH 200-200-20 MG/5ML PO SUSP: 30.0000 mL | ORAL | Status: DC | PRN

## 2017-03-28 MED ORDER — SODIUM CHLORIDE 0.9 % IV SOLN
INTRAVENOUS | Status: AC
  Filled 2017-03-28: qty 500000

## 2017-03-28 MED ORDER — HYDROMORPHONE HCL-NACL 0.5-0.9 MG/ML-% IV SOSY
0.5000 mg | PREFILLED_SYRINGE | INTRAVENOUS | Status: DC | PRN
  Administered 2017-03-28: 11:00:00 0.5 mg via INTRAVENOUS
  Filled 2017-03-28: qty 1

## 2017-03-28 MED ORDER — SODIUM CHLORIDE 0.9 % IR SOLN
Status: DC | PRN
  Administered 2017-03-28: 1000 mL

## 2017-03-28 MED ORDER — MIDAZOLAM HCL 2 MG/2ML IJ SOLN
0.5000 mg | Freq: Once | INTRAMUSCULAR | Status: AC | PRN
  Administered 2017-03-28: 0.5 mg via INTRAVENOUS

## 2017-03-28 MED ORDER — SCOPOLAMINE 1 MG/3DAYS TD PT72
MEDICATED_PATCH | TRANSDERMAL | Status: AC
  Filled 2017-03-28: qty 1

## 2017-03-28 MED ORDER — CHLORHEXIDINE GLUCONATE 4 % EX LIQD
60.0000 mL | Freq: Once | CUTANEOUS | Status: DC
Start: 2017-03-28 — End: 2017-03-28

## 2017-03-28 MED ORDER — TRANEXAMIC ACID 1000 MG/10ML IV SOLN
2000.0000 mg | Freq: Once | INTRAVENOUS | Status: DC
  Filled 2017-03-28: qty 20

## 2017-03-28 MED ORDER — DIPHENHYDRAMINE HCL 12.5 MG/5ML PO ELIX
12.5000 mg | ORAL_SOLUTION | Freq: Four times a day (QID) | ORAL | Status: DC | PRN
  Administered 2017-03-29: 17:00:00 12.5 mg via ORAL
  Filled 2017-03-28: qty 5

## 2017-03-28 MED ORDER — LIDOCAINE 2% (20 MG/ML) 5 ML SYRINGE
INTRAMUSCULAR | Status: AC
  Filled 2017-03-28: qty 5

## 2017-03-28 MED ORDER — LACTATED RINGERS IV SOLN: INTRAVENOUS | Status: DC

## 2017-03-28 MED ORDER — TRANEXAMIC ACID 1000 MG/10ML IV SOLN
INTRAVENOUS | Status: AC | PRN
  Administered 2017-03-28: 2000 mg via TOPICAL

## 2017-03-28 MED ORDER — ROPIVACAINE HCL 7.5 MG/ML IJ SOLN
INTRAMUSCULAR | Status: DC | PRN
  Administered 2017-03-28: 20 mL via PERINEURAL

## 2017-03-28 MED ORDER — PROMETHAZINE HCL 25 MG/ML IJ SOLN: 6.2500 mg | INTRAMUSCULAR | Status: DC | PRN

## 2017-03-28 MED ORDER — ONDANSETRON HCL 4 MG/2ML IJ SOLN
INTRAMUSCULAR | Status: AC
  Filled 2017-03-28: qty 2

## 2017-03-28 MED ORDER — LACTATED RINGERS IV SOLN
INTRAVENOUS | Status: DC
  Administered 2017-03-28: 1000 mL via INTRAVENOUS
  Administered 2017-03-29: 03:00:00 via INTRAVENOUS

## 2017-03-28 MED ORDER — SCOPOLAMINE 1 MG/3DAYS TD PT72
MEDICATED_PATCH | TRANSDERMAL | Status: DC | PRN
  Administered 2017-03-28: 1 via TRANSDERMAL

## 2017-03-28 MED ORDER — BUPIVACAINE HCL (PF) 0.25 % IJ SOLN
INTRAMUSCULAR | Status: AC
  Filled 2017-03-28: qty 30

## 2017-03-28 MED ORDER — PHENYLEPHRINE 40 MCG/ML (10ML) SYRINGE FOR IV PUSH (FOR BLOOD PRESSURE SUPPORT)
PREFILLED_SYRINGE | INTRAVENOUS | Status: DC | PRN
  Administered 2017-03-28 (×2): 80 ug via INTRAVENOUS
  Administered 2017-03-28 (×3): 40 ug via INTRAVENOUS
  Administered 2017-03-28: 80 ug via INTRAVENOUS
  Administered 2017-03-28: 40 ug via INTRAVENOUS

## 2017-03-28 MED ORDER — PHENYLEPHRINE 40 MCG/ML (10ML) SYRINGE FOR IV PUSH (FOR BLOOD PRESSURE SUPPORT)
PREFILLED_SYRINGE | INTRAVENOUS | Status: AC
  Filled 2017-03-28: qty 20

## 2017-03-28 MED ORDER — MIDAZOLAM HCL 2 MG/2ML IJ SOLN
INTRAMUSCULAR | Status: DC | PRN
  Administered 2017-03-28: 2 mg via INTRAVENOUS

## 2017-03-28 MED ORDER — PHENYLEPHRINE HCL 10 MG/ML IJ SOLN
INTRAVENOUS | Status: DC | PRN
  Administered 2017-03-28: 10 ug/min via INTRAVENOUS

## 2017-03-28 MED ORDER — EPHEDRINE 5 MG/ML INJ
INTRAVENOUS | Status: AC
  Filled 2017-03-28: qty 10

## 2017-03-28 MED ORDER — IRBESARTAN 75 MG PO TABS
75.0000 mg | ORAL_TABLET | Freq: Every day | ORAL | Status: DC
  Administered 2017-03-28 – 2017-03-30 (×3): 75 mg via ORAL
  Filled 2017-03-28 (×3): qty 1

## 2017-03-28 MED ORDER — ACETAMINOPHEN 325 MG PO TABS: 650.0000 mg | ORAL_TABLET | Freq: Four times a day (QID) | ORAL | Status: DC | PRN

## 2017-03-28 MED ORDER — ONDANSETRON HCL 4 MG PO TABS: 4.0000 mg | ORAL_TABLET | Freq: Four times a day (QID) | ORAL | Status: DC | PRN

## 2017-03-28 MED ORDER — RIVAROXABAN 10 MG PO TABS
10.0000 mg | ORAL_TABLET | Freq: Every day | ORAL | Status: DC
  Administered 2017-03-29 – 2017-03-30 (×2): 10 mg via ORAL
  Filled 2017-03-28 (×2): qty 1

## 2017-03-28 MED ORDER — ONDANSETRON HCL 4 MG/2ML IJ SOLN
INTRAMUSCULAR | Status: DC | PRN
  Administered 2017-03-28: 4 mg via INTRAVENOUS

## 2017-03-28 MED ORDER — PHENOL 1.4 % MT LIQD: 1.0000 | OROMUCOSAL | Status: DC | PRN

## 2017-03-28 MED ORDER — LACTATED RINGERS IV SOLN
INTRAVENOUS | Status: DC | PRN
  Administered 2017-03-28 (×2): via INTRAVENOUS

## 2017-03-28 MED ORDER — SODIUM CHLORIDE 0.9 % IJ SOLN
INTRAMUSCULAR | Status: DC | PRN
  Administered 2017-03-28: 20 mL

## 2017-03-28 MED ORDER — CEFAZOLIN SODIUM-DEXTROSE 2-4 GM/100ML-% IV SOLN
INTRAVENOUS | Status: AC
  Filled 2017-03-28: qty 100

## 2017-03-28 MED ORDER — OXYCODONE-ACETAMINOPHEN 5-325 MG PO TABS: 2.0000 | ORAL_TABLET | ORAL | Status: DC | PRN

## 2017-03-28 SURGICAL SUPPLY — 65 items
BAG DECANTER FOR FLEXI CONT (MISCELLANEOUS) ×6 IMPLANT
BAG ZIPLOCK 12X15 (MISCELLANEOUS) IMPLANT
BANDAGE ACE 4X5 VEL STRL LF (GAUZE/BANDAGES/DRESSINGS) ×3 IMPLANT
BANDAGE ACE 6X5 VEL STRL LF (GAUZE/BANDAGES/DRESSINGS) ×3 IMPLANT
BLADE SAG 18X100X1.27 (BLADE) ×3 IMPLANT
BLADE SAW SGTL 11.0X1.19X90.0M (BLADE) ×3 IMPLANT
BNDG GAUZE ELAST 4 BULKY (GAUZE/BANDAGES/DRESSINGS) ×6 IMPLANT
BONE CEMENT GENTAMICIN (Cement) ×6 IMPLANT
CAP KNEE TOTAL 3 SIGMA ×3 IMPLANT
CEMENT BONE GENTAMICIN 40 (Cement) ×2 IMPLANT
CLOSURE WOUND 1/2 X4 (GAUZE/BANDAGES/DRESSINGS) ×1
COVER SURGICAL LIGHT HANDLE (MISCELLANEOUS) ×3 IMPLANT
CUFF TOURN SGL QUICK 34 (TOURNIQUET CUFF) ×2
CUFF TRNQT CYL 34X4X40X1 (TOURNIQUET CUFF) ×1 IMPLANT
DECANTER SPIKE VIAL GLASS SM (MISCELLANEOUS) ×3 IMPLANT
DRAPE INCISE IOBAN 66X45 STRL (DRAPES) ×3 IMPLANT
DRAPE U-SHAPE 47X51 STRL (DRAPES) ×3 IMPLANT
DRSG ADAPTIC 3X8 NADH LF (GAUZE/BANDAGES/DRESSINGS) ×3 IMPLANT
DRSG PAD ABDOMINAL 8X10 ST (GAUZE/BANDAGES/DRESSINGS) ×3 IMPLANT
DURAPREP 26ML APPLICATOR (WOUND CARE) ×3 IMPLANT
ELECT REM PT RETURN 15FT ADLT (MISCELLANEOUS) ×3 IMPLANT
EVACUATOR 1/8 PVC DRAIN (DRAIN) ×3 IMPLANT
FACESHIELD WRAPAROUND (MASK) ×3 IMPLANT
GAUZE SPONGE 4X4 12PLY STRL (GAUZE/BANDAGES/DRESSINGS) ×3 IMPLANT
GLOVE BIOGEL PI IND STRL 6.5 (GLOVE) ×1 IMPLANT
GLOVE BIOGEL PI IND STRL 7.0 (GLOVE) ×1 IMPLANT
GLOVE BIOGEL PI IND STRL 8.5 (GLOVE) ×1 IMPLANT
GLOVE BIOGEL PI INDICATOR 6.5 (GLOVE) ×2
GLOVE BIOGEL PI INDICATOR 7.0 (GLOVE) ×2
GLOVE BIOGEL PI INDICATOR 8.5 (GLOVE) ×2
GLOVE ECLIPSE 8.0 STRL XLNG CF (GLOVE) ×6 IMPLANT
GLOVE SURG SS PI 6.5 STRL IVOR (GLOVE) ×3 IMPLANT
GOWN STRL REUS W/TWL LRG LVL3 (GOWN DISPOSABLE) ×6 IMPLANT
GOWN STRL REUS W/TWL XL LVL3 (GOWN DISPOSABLE) ×6 IMPLANT
HANDPIECE INTERPULSE COAX TIP (DISPOSABLE) ×2
HEMOSTAT SPONGE AVITENE ULTRA (HEMOSTASIS) ×3 IMPLANT
IMMOBILIZER KNEE 20 (SOFTGOODS) ×3
IMMOBILIZER KNEE 20 THIGH 36 (SOFTGOODS) ×1 IMPLANT
MANIFOLD NEPTUNE II (INSTRUMENTS) ×3 IMPLANT
NEEDLE HYPO 21X1.5 SAFETY (NEEDLE) IMPLANT
NEEDLE HYPO 22GX1.5 SAFETY (NEEDLE) IMPLANT
NS IRRIG 1000ML POUR BTL (IV SOLUTION) IMPLANT
PACK TOTAL KNEE CUSTOM (KITS) ×3 IMPLANT
PAD ABD 8X10 STRL (GAUZE/BANDAGES/DRESSINGS) ×3 IMPLANT
PADDING CAST COTTON 6X4 STRL (CAST SUPPLIES) ×3 IMPLANT
PENCIL SMOKE EVAC W/HOLSTER (ELECTROSURGICAL) IMPLANT
POSITIONER SURGICAL ARM (MISCELLANEOUS) ×3 IMPLANT
SET HNDPC FAN SPRY TIP SCT (DISPOSABLE) ×1 IMPLANT
SET PAD KNEE POSITIONER (MISCELLANEOUS) ×3 IMPLANT
SPONGE LAP 18X18 X RAY DECT (DISPOSABLE) IMPLANT
STRIP CLOSURE SKIN 1/2X4 (GAUZE/BANDAGES/DRESSINGS) ×2 IMPLANT
SUT BONE WAX W31G (SUTURE) IMPLANT
SUT MNCRL AB 4-0 PS2 18 (SUTURE) ×3 IMPLANT
SUT VIC AB 1 CT1 27 (SUTURE) ×4
SUT VIC AB 1 CT1 27XBRD ANTBC (SUTURE) ×2 IMPLANT
SUT VIC AB 2-0 CT1 27 (SUTURE) ×6
SUT VIC AB 2-0 CT1 TAPERPNT 27 (SUTURE) ×3 IMPLANT
SUT VLOC 180 0 24IN GS25 (SUTURE) ×3 IMPLANT
SYR 20CC LL (SYRINGE) ×6 IMPLANT
TOWER CARTRIDGE SMART MIX (DISPOSABLE) ×3 IMPLANT
TRAY FOLEY CATH 14FR (SET/KITS/TRAYS/PACK) ×3 IMPLANT
TRAY FOLEY W/METER SILVER 16FR (SET/KITS/TRAYS/PACK) IMPLANT
WATER STERILE IRR 1500ML POUR (IV SOLUTION) ×6 IMPLANT
WRAP KNEE MAXI GEL POST OP (GAUZE/BANDAGES/DRESSINGS) ×3 IMPLANT
YANKAUER SUCT BULB TIP 10FT TU (MISCELLANEOUS) ×3 IMPLANT

## 2017-03-28 NOTE — Anesthesia Procedure Notes (Signed)
Date/Time: 03/28/2017 7:00 AM Performed by: Cynda Familia Pre-anesthesia Checklist: Patient identified, Suction available and Patient being monitored Oxygen Delivery Method: Nasal cannula Placement Confirmation: breath sounds checked- equal and bilateral and positive ETCO2 Comments:  O2 for sedation---- PNB by [name]

## 2017-03-28 NOTE — Discharge Instructions (Addendum)

## 2017-03-28 NOTE — Anesthesia Procedure Notes (Signed)
Procedure Name: LMA Insertion Date/Time: 03/28/2017 7:38 AM Performed by: Cynda Familia Pre-anesthesia Checklist: Patient identified, Emergency Drugs available, Suction available and Patient being monitored Patient Re-evaluated:Patient Re-evaluated prior to induction Oxygen Delivery Method: Circle System Utilized Preoxygenation: Pre-oxygenation with 100% oxygen Induction Type: IV induction Ventilation: Mask ventilation without difficulty LMA: LMA inserted LMA Size: 4.0 Tube type: Oral Tube size: 4.5 mm Number of attempts: 1 Airway Equipment and Method: Bite block Placement Confirmation: positive ETCO2 Tube secured with: Tape Dental Injury: Teeth and Oropharynx as per pre-operative assessment  Comments: Smooth IV induction [name]--- LMA inserted atraumatic---- teeth and mouth as preop--- many teeth chipped-- pt did not report on interview--- bilat BS [name]

## 2017-03-28 NOTE — Interval H&P Note (Signed)
History and Physical Interval Note:  03/28/2017 7:11 AM  Denise [name]  has presented today for surgery, with the diagnosis of Right knee osteoarthritis   The various methods of treatment have been discussed with the patient and family. After consideration of risks, benefits and other options for treatment, the patient has consented to  Procedure(s): RIGHT TOTAL KNEE ARTHROPLASTY (Right) as a surgical intervention .  The patient's history has been reviewed, patient examined, no change in status, stable for surgery.  I have reviewed the patient's chart and labs.  Questions were answered to the patient's satisfaction.     Vishaal Strollo A

## 2017-03-28 NOTE — Transfer of Care (Signed)
Immediate Anesthesia Transfer of Care Note  Patient: Denise [name]  Procedure(s) Performed: Procedure(s): RIGHT TOTAL KNEE ARTHROPLASTY (Right)  Patient Location: PACU  Anesthesia Type:General  Level of Consciousness: sedated  Airway & Oxygen Therapy: Patient Spontanous Breathing and Patient connected to nasal cannula oxygen  Post-op Assessment: Report given to RN and Post -op Vital signs reviewed and stable  Post vital signs: Reviewed and stable  Last Vitals:  Vitals:   03/28/17 0722 03/28/17 0723  BP:    Pulse: 77 75  Resp: 14 10  Temp:    SpO2: 100% 100%    Last Pain:  Vitals:   03/28/17 0549  TempSrc:   PainSc: 4       Patients Stated Pain Goal: 3 (34/03/70 9643)  Complications: No apparent anesthesia complications

## 2017-03-28 NOTE — Anesthesia Procedure Notes (Signed)
Anesthesia Regional Block: Adductor canal block   Pre-Anesthetic Checklist: ,, timeout performed, Correct Patient, Correct Site, Correct Laterality, Correct Procedure, Correct Position, site marked, Risks and benefits discussed,  Surgical consent,  Pre-op evaluation,  At surgeon's request and post-op pain management  Laterality: Right and Lower  Prep: chloraprep       Needles:  Injection technique: Single-shot  Needle Type: Echogenic Needle     Needle Length: 9cm  Needle Gauge: 21     Additional Needles:   Procedures: ultrasound guided,,,,,,,,  Narrative:  Start time: 03/28/2017 7:10 AM End time: 03/28/2017 7:24 AM Injection made incrementally with aspirations every 5 mL.  Performed by: Personally  Anesthesiologist: [name], Geremiah Fussell  Additional Notes: Pt identified in Holding room.  Monitors applied. Working IV access confirmed. Sterile prep, R thigh.  #21ga ECHOgenic needle into adductor canal with US guidance.  20cc 0.5% Ropivacaine injected incrementally after negative test dose.  Patient asymptomatic, VSS, no heme aspirated, tolerated well.  Jenita Seashore, MD

## 2017-03-28 NOTE — Brief Op Note (Signed)
03/28/2017  8:55 AM  PATIENT:  Denise [name]  71 y.o. female  PRE-OPERATIVE DIAGNOSIS:  Right knee Primary  osteoarthritis with Flexion contracture  POST-OPERATIVE DIAGNOSIS:  Right knee Primary  osteoarthritis with Flexion Contracture.  PROCEDURE:  Procedure(s): RIGHT TOTAL KNEE ARTHROPLASTY (Right)  SURGEON:  Surgeon(s) and Role:    * Latanya Maudlin, MD - Primary  PHYSICIAN ASSISTANT: Ardeen Jourdain PA  ASSISTANTS: Armed forces operational officer PA}   ANESTHESIA:   general  EBL:  Total I/O In: 1000 [I.V.:1000] Out: 100 [Urine:100]  BLOOD ADMINISTERED:none  DRAINS: (One) Hemovact drain(s) in the Right Knee with  Suction Open   LOCAL MEDICATIONS USED:  OTHER 20cc of Exparel with 20cc of Normal Saline  SPECIMEN:  No Specimen  DISPOSITION OF SPECIMEN:  N/A  COUNTS:  YES  TOURNIQUET:  * Missing tourniquet times found for documented tourniquets in log:  [id number] *  DICTATION: .Other Dictation: Dictation Number Stable in OR  PLAN OF CARE: Other Dictation: Dictation Number [id number]  PATIENT DISPOSITION:  Stable in OR   Delay start of Pharmacological VTE agent (>24hrs) due to surgical blood loss or risk of bleeding: yes

## 2017-03-28 NOTE — Anesthesia Postprocedure Evaluation (Signed)
Anesthesia Post Note  Patient: Denise [name]  Procedure(s) Performed: Procedure(s) (LRB): RIGHT TOTAL KNEE ARTHROPLASTY (Right)     Patient location during evaluation: PACU Anesthesia Type: General Level of consciousness: oriented, sedated and patient cooperative Pain management: pain level controlled (pain improving) Vital Signs Assessment: post-procedure vital signs reviewed and stable Respiratory status: spontaneous breathing, nonlabored ventilation, respiratory function stable and patient connected to nasal cannula oxygen Cardiovascular status: blood pressure returned to baseline and stable Postop Assessment: no signs of nausea or vomiting Anesthetic complications: no    Last Vitals:  Vitals:   03/28/17 1153 03/28/17 1301  BP: 139/63 (!) 144/63  Pulse: 82 80  Resp: 16 16  Temp: 36.6 C 36.7 C  SpO2: 96% 98%    Last Pain:  Vitals:   03/28/17 1301  TempSrc: Oral  PainSc:                  Samuel Mcpeek,E. Dymond Gutt

## 2017-03-28 NOTE — Op Note (Signed)
Denise Denise [name], Denise [name]            ACCOUNT NO.:  [id number]  MEDICAL RECORD NO.:  [id number]  LOCATION:                                 FACILITY:  PHYSICIAN:  Kipp Brood. Jovahn Breit, M.D.DATE OF BIRTH:  1945-11-10  DATE OF PROCEDURE:  03/28/2017 DATE OF DISCHARGE:                              OPERATIVE REPORT   SURGEON:  Kipp Brood. Gladstone Lighter, M.D.  ASSISTANT:  Ardeen Jourdain, [place]  PREOPERATIVE DIAGNOSES: 1. Flexion contracture, right knee. 2. Severe primary osteoarthritis with bone-on-bone, right knee.  POSTOPERATIVE DIAGNOSES: 1. Flexion contracture, right knee. 2. Severe primary osteoarthritis with bone-on-bone, right knee.  OPERATION:  Right total knee arthroplasty utilizing the DePuy system, all 3 components were cemented, and gentamicin was used in the cement. The sizes used were a 2-1/2 right posterior cruciate sacrificing femoral component, the tibial tray was a size 2.5, the insert was a size 2-1/2, 12.5 mm thickness rotating platform polyethylene insert, the patella was a size 35 with 3 pegs.  DESCRIPTION OF PROCEDURE:  Under general anesthesia, routine orthopedic prep and draping of the right lower extremity was carried out.  The appropriate time-out was first carried out.  I also marked the appropriate right leg in the holding area.  At this time, the right leg was exsanguinated with Esmarch.  Tourniquet was elevated to 325 mmHg. The leg then was placed in a DeMayo knee holder.  At this time, an anterior approach to the knee was carried out after she had 2 g of IV Ancef.  Following that, an anterior incision was carried out.  Following that, I did a median parapatellar incision, reflected the patella laterally.  We then excised the anterior and posterior cruciate ligaments and did medial and lateral meniscectomies.  At this particular time, we then made our initial drill hole in the intercondylar notch of the femur.  Guide rod was inserted nicely in the canal.  I then  removed the guide rod, irrigated out the canal.  I then removed 12 mm thickness off the distal femur.  Following that, we measured the femur to be a size 2.5.  We then made our anterior, posterior, and chamfering cuts for a size 2-1/2 right femoral component.  Next, attention was directed to the tibia.  I removed the spurs from the tibia.  At this time, we then measured the tibia to be a size 2.5.  Initial drill hole was made at the tibial plateau.  The guide rod was inserted.  We had a nice position in the canal.  We then removed the guide rod, irrigated out the canal, and then removed 4 mm thickness off the affected medial side across the midline.  At this time, we then irrigated out the knee and then inserted our lamina spreaders and continued our dissection.  Following that, we inserted our gap instruments to measure the flexion and extension gap. We had excellent position with a 12.5 mm thickness.  We then irrigated out the knee and then made our keel cut to the tibia and then the distal femoral notch cut.  Trial components were inserted and we utilized a 12.5 mm thickness insert which fit quite nicely.  At that time, we then did  a resurfacing procedure on the patella for a size 35 patella.  Three drill holes were made in the articular surface of the patella.  All trial components were removed.  We water picked out the knee, dried the knee out, and cemented all 3 components in simultaneously.  Once the cement was hardened, we removed all loose pieces of cement.  Then, we irrigated again and then inserted our permanent rotating platform, reduced the knee and had excellent function.  The Hemovac drain was inserted.  The knee was closed over the Hemovac drain.  Usual closure was carried out.  Sterile dressings applied.  Note, she had 2 g of IV Ancef preop.  We irrigated the wound out as well with antibiotic solution.  Postop, she will be on Xarelto and be admitted for at least  2 nights.          ______________________________ Kipp Brood. Gladstone Lighter, M.D.     RAG/MEDQ  D:  03/28/2017  T:  03/28/2017  Job:  [id number]

## 2017-03-28 NOTE — Anesthesia Procedure Notes (Signed)
Date/Time: 03/28/2017 9:28 AM Performed by: Cynda Familia Oxygen Delivery Method: Nasal cannula Placement Confirmation: positive ETCO2 and breath sounds checked- equal and bilateral Comments: lma removed good aw to pacu o2 intact

## 2017-03-28 NOTE — Evaluation (Signed)
Physical Therapy Evaluation Patient Details Name: Denise [name] MRN: [id number] DOB: 1946-04-22 Today's Date: 03/28/2017   History of Present Illness  Pt s/p R TKR  Clinical Impression  Pt s/p R TKR and presents with decreased R LE strength/ROM and post op pain limiting functional mobility.  Pt should progress to dc home with family assist.    Follow Up Recommendations DC plan and follow up therapy as arranged by surgeon    Equipment Recommendations  Rolling walker with 5" wheels    Recommendations for Other Services OT consult     Precautions / Restrictions Precautions Precautions: Knee;Fall Required Braces or Orthoses: Knee Immobilizer - Right Knee Immobilizer - Right: Discontinue once straight leg raise with < 10 degree lag Restrictions Weight Bearing Restrictions: No Other Position/Activity Restrictions: WBAT      Mobility  Bed Mobility Overal bed mobility: Needs Assistance Bed Mobility: Supine to Sit     Supine to sit: Min assist     General bed mobility comments: cues for sequence and use of L LE to self assist  Transfers Overall transfer level: Needs assistance Equipment used: Rolling walker (2 wheeled) Transfers: Sit to/from Stand Sit to Stand: Min assist         General transfer comment: cues for LE management and use of UEs to self assist  Ambulation/Gait Ambulation/Gait assistance: Min assist Ambulation Distance (Feet): 34 Feet Assistive device: Rolling walker (2 wheeled) Gait Pattern/deviations: Step-to pattern;Decreased step length - right;Decreased step length - left;Shuffle;Trunk flexed Gait velocity: decr Gait velocity interpretation: Below normal speed for age/gender General Gait Details: cues for sequence, posture and position from [employer]            Wheelchair Mobility    Modified Rankin (Stroke Patients Only)       Balance                                             Pertinent Vitals/Pain Pain  Assessment: 0-10 Pain Score: 4  Pain Location: R knee Pain Descriptors / Indicators: Aching;Sore Pain Intervention(s): Limited activity within patient's tolerance;Monitored during session;Premedicated before session;Ice applied    Home Living Family/patient expects to be discharged to:: Private residence Living Arrangements: Spouse/significant other Available Help at Discharge: Family Type of Home: House Home Access: Stairs to enter   Technical brewer of Steps: 1 Home Layout: One level Home Equipment: Walker - standard;Cane - single point      Prior Function Level of Independence: Independent               Hand Dominance        Extremity/Trunk Assessment   Upper Extremity Assessment Upper Extremity Assessment: Overall WFL for tasks assessed    Lower Extremity Assessment Lower Extremity Assessment: RLE deficits/detail    Cervical / Trunk Assessment Cervical / Trunk Assessment: Normal  Communication   Communication: No difficulties  Cognition Arousal/Alertness: Awake/alert Behavior During Therapy: WFL for tasks assessed/performed Overall Cognitive Status: Within Functional Limits for tasks assessed                                        General Comments      Exercises Total Joint Exercises Ankle Circles/Pumps: AROM;Both;15 reps;Supine   Assessment/Plan    PT Assessment Patient needs continued PT services  PT Problem List Decreased strength;Decreased range of motion;Decreased activity tolerance;Decreased mobility;Decreased knowledge of use of DME       PT Treatment Interventions DME instruction;Gait training;Stair training;Functional mobility training;Therapeutic activities;Therapeutic exercise;Patient/family education    PT Goals (Current goals can be found in the Care Plan section)  Acute Rehab PT Goals Patient Stated Goal: Regain IND PT Goal Formulation: With patient Time For Goal Achievement: 03/31/17 Potential to Achieve  Goals: Good    Frequency 7X/week   Barriers to discharge        Co-evaluation               AM-PAC PT "6 Clicks" Daily Activity  Outcome Measure Difficulty turning over in bed (including adjusting bedclothes, sheets and blankets)?: Unable Difficulty moving from lying on back to sitting on the side of the bed? : Unable Difficulty sitting down on and standing up from a chair with arms (e.g., wheelchair, bedside commode, etc,.)?: Unable Help needed moving to and from a bed to chair (including a wheelchair)?: A Little Help needed walking in hospital room?: A Little Help needed climbing 3-5 steps with a railing? : A Lot 6 Click Score: 11    End of Session Equipment Utilized During Treatment: Gait belt;Right knee immobilizer Activity Tolerance: Patient tolerated treatment well Patient left: in chair;with call bell/phone within reach;with family/visitor present Nurse Communication: Mobility status PT Visit Diagnosis: Difficulty in walking, not elsewhere classified (R26.2)    Time: 4132-4401 PT Time Calculation (min) (ACUTE ONLY): 24 min   Charges:   PT Evaluation $PT Eval Low Complexity: 1 Low PT Treatments $Gait Training: 8-22 mins   PT G Codes:        Pg [phone]   Sanaya Gwilliam 03/28/2017, 5:12 PM

## 2017-03-29 LAB — CBC
HCT: 33 % — ABNORMAL LOW (ref 36.0–46.0)
HEMOGLOBIN: 11.4 g/dL — AB (ref 12.0–15.0)
MCH: 30.1 pg (ref 26.0–34.0)
MCHC: 34.5 g/dL (ref 30.0–36.0)
MCV: 87.1 fL (ref 78.0–100.0)
Platelets: 232 10*3/uL (ref 150–400)
RBC: 3.79 MIL/uL — ABNORMAL LOW (ref 3.87–5.11)
RDW: 12.4 % (ref 11.5–15.5)
WBC: 16.4 10*3/uL — ABNORMAL HIGH (ref 4.0–10.5)

## 2017-03-29 LAB — BASIC METABOLIC PANEL
Anion gap: 9 (ref 5–15)
BUN: 18 mg/dL (ref 6–20)
CO2: 25 mmol/L (ref 22–32)
Calcium: 8.6 mg/dL — ABNORMAL LOW (ref 8.9–10.3)
Chloride: 104 mmol/L (ref 101–111)
Creatinine, Ser: 0.81 mg/dL (ref 0.44–1.00)
GLUCOSE: 134 mg/dL — AB (ref 65–99)
Potassium: 4.2 mmol/L (ref 3.5–5.1)
SODIUM: 138 mmol/L (ref 135–145)

## 2017-03-29 MED ORDER — ASPIRIN EC 325 MG PO TBEC: 325.0000 mg | DELAYED_RELEASE_TABLET | Freq: Two times a day (BID) | ORAL | 0 refills | Status: DC

## 2017-03-29 MED ORDER — METHOCARBAMOL 500 MG PO TABS: 500.0000 mg | ORAL_TABLET | Freq: Four times a day (QID) | ORAL | 0 refills | Status: DC | PRN

## 2017-03-29 MED ORDER — HYDROCODONE-ACETAMINOPHEN 5-325 MG PO TABS: 1.0000 | ORAL_TABLET | ORAL | 0 refills | Status: DC | PRN

## 2017-03-29 NOTE — Progress Notes (Signed)
Physical Therapy Treatment Patient Details Name: Denise [name] MRN: [id number] DOB: [birth year] Today's Date: 03/29/2017    History of Present Illness Pt s/p R TKR    PT Comments    Pt progressing well with mobility and hopeful for dc home tomorrow am.   Follow Up Recommendations  DC plan and follow up therapy as arranged by surgeon     Equipment Recommendations  Rolling walker with 5" wheels    Recommendations for Other Services OT consult     Precautions / Restrictions Precautions Precautions: Knee;Fall Required Braces or Orthoses: Knee Immobilizer - Right Knee Immobilizer - Right: Discontinue once straight leg raise with < 10 degree lag Restrictions Weight Bearing Restrictions: No Other Position/Activity Restrictions: WBAT    Mobility  Bed Mobility               General bed mobility comments: Pt OOB with nursing and requests back to chair  Transfers Overall transfer level: Needs assistance Equipment used: Rolling walker (2 wheeled) Transfers: Sit to/from Stand Sit to Stand: Min guard         General transfer comment: cues for LE management and use of UEs to self assist  Ambulation/Gait Ambulation/Gait assistance: Min assist;Min guard Ambulation Distance (Feet): 110 Feet Assistive device: Rolling walker (2 wheeled) Gait Pattern/deviations: Step-to pattern;Decreased step length - right;Decreased step length - left;Shuffle;Trunk flexed Gait velocity: decr Gait velocity interpretation: Below normal speed for age/gender General Gait Details: cues for sequence, posture and position from [employer]            Wheelchair Mobility    Modified Rankin (Stroke Patients Only)       Balance                                            Cognition Arousal/Alertness: Awake/alert Behavior During Therapy: WFL for tasks assessed/performed Overall Cognitive Status: Within Functional Limits for tasks assessed                                         Exercises Total Joint Exercises Ankle Circles/Pumps: AROM;Both;15 reps;Supine Quad Sets: AROM;10 reps;Supine;Both Heel Slides: AAROM;Right;15 reps;Supine Straight Leg Raises: AAROM;Right;10 reps;Supine    General Comments        Pertinent Vitals/Pain Pain Assessment: 0-10 Pain Score: 4  Pain Location: R knee Pain Descriptors / Indicators: Aching;Sore Pain Intervention(s): Limited activity within patient's tolerance;Monitored during session;Premedicated before session;Ice applied    Home Living                      Prior Function            PT Goals (current goals can now be found in the care plan section) Acute Rehab PT Goals Patient Stated Goal: Regain IND PT Goal Formulation: With patient Time For Goal Achievement: 03/31/17 Potential to Achieve Goals: Good Progress towards PT goals: Progressing toward goals    Frequency    7X/week      PT Plan Current plan remains appropriate    Co-evaluation              AM-PAC PT "6 Clicks" Daily Activity  Outcome Measure  Difficulty turning over in bed (including adjusting bedclothes, sheets and blankets)?: Unable Difficulty moving from lying on back to  sitting on the side of the bed? : Unable Difficulty sitting down on and standing up from a chair with arms (e.g., wheelchair, bedside commode, etc,.)?: Unable Help needed moving to and from a bed to chair (including a wheelchair)?: A Little Help needed walking in hospital room?: A Little Help needed climbing 3-5 steps with a railing? : A Little 6 Click Score: 12    End of Session Equipment Utilized During Treatment: Gait belt;Right knee immobilizer Activity Tolerance: Patient tolerated treatment well Patient left: in chair;with call bell/phone within reach;with family/visitor present Nurse Communication: Mobility status PT Visit Diagnosis: Difficulty in walking, not elsewhere classified (R26.2)     Time:  7579-7282 PT Time Calculation (min) (ACUTE ONLY): 30 min  Charges:  $Gait Training: 8-22 mins $Therapeutic Exercise: 8-22 mins                    G Codes:       Pg [phone]    Vidyuth Belsito 03/29/2017, 1:18 PM

## 2017-03-29 NOTE — Evaluation (Signed)
Occupational Therapy Evaluation Patient Details Name: Denise [name] MRN: [id number] DOB: 11-Mar-1946 Today's Date: 03/29/2017    History of Present Illness Pt s/p R TKR   Clinical Impression   This 71 year old female was admitted for the above sx. Will plan to see her once more prior to d/c to practice shower transfer.  Husband will assist with adls.    Follow Up Recommendations  No OT follow up;Supervision/Assistance - 24 hour    Equipment Recommendations  None recommended by OT    Recommendations for Other Services       Precautions / Restrictions Precautions Precautions: Knee;Fall Required Braces or Orthoses: Knee Immobilizer - Right Knee Immobilizer - Right: Discontinue once straight leg raise with < 10 degree lag Restrictions Other Position/Activity Restrictions: WBAT      Mobility Bed Mobility               General bed mobility comments: oob  Transfers   Equipment used: Rolling walker (2 wheeled)   Sit to Stand: Min guard         General transfer comment: cues for LE management and use of UEs to self assist    Balance                                           ADL either performed or assessed with clinical judgement   ADL Overall ADL's : Needs assistance/impaired     Grooming: Supervision/safety;Standing   Upper Body Bathing: Set up;Sitting   Lower Body Bathing: Minimal assistance;Sit to/from stand   Upper Body Dressing : Set up;Sitting   Lower Body Dressing: Maximal assistance;Sit to/from stand   Toilet Transfer: Min guard;RW;Comfort height toilet;Ambulation   Toileting- Clothing Manipulation and Hygiene: Supervision/safety;Sit to/from stand         General ADL Comments: educated on shower sequence; did not practice. Husband will assist with adls. She has also borrowed a 3:1 and may use this as a Water engineer     Praxis      Pertinent Vitals/Pain Pain Assessment:  0-10 Pain Score: 4  Pain Location: R knee Pain Descriptors / Indicators: Aching;Sore Pain Intervention(s): Limited activity within patient's tolerance;Monitored during session;Premedicated before session;Repositioned;Ice applied     Hand Dominance     Extremity/Trunk Assessment Upper Extremity Assessment Upper Extremity Assessment: Overall WFL for tasks assessed           Communication Communication Communication: No difficulties   Cognition Arousal/Alertness: Awake/alert Behavior During Therapy: WFL for tasks assessed/performed Overall Cognitive Status: Within Functional Limits for tasks assessed                                     General Comments       Exercises     Shoulder Instructions      Home Living Family/patient expects to be discharged to:: Private residence Living Arrangements: Spouse/significant other                 Bathroom Shower/Tub: Occupational psychologist: Handicapped height     Home Equipment: Shower seat - built in   Additional Comments: vanity next to commode;      Prior Functioning/Environment Level of Independence: Independent  OT Problem List: Pain;Decreased knowledge of use of DME or AE      OT Treatment/Interventions: Self-care/ADL training;DME and/or AE instruction;Patient/family education    OT Goals(Current goals can be found in the care plan section) Acute Rehab OT Goals Patient Stated Goal: Regain IND OT Goal Formulation: With patient Time For Goal Achievement: 03/31/17 Potential to Achieve Goals: Good ADL Goals Pt Will Perform Tub/Shower Transfer: Shower transfer;with min guard assist;ambulating;rolling walker;3 in 1  OT Frequency: Min 2X/week   Barriers to D/C:            Co-evaluation              AM-PAC PT "6 Clicks" Daily Activity     Outcome Measure Help from another person eating meals?: None Help from another person taking care of personal  grooming?: A Little Help from another person toileting, which includes using toliet, bedpan, or urinal?: A Little Help from another person bathing (including washing, rinsing, drying)?: A Little Help from another person to put on and taking off regular upper body clothing?: A Little Help from another person to put on and taking off regular lower body clothing?: A Lot 6 Click Score: 18   End of Session    Activity Tolerance: Patient tolerated treatment well Patient left: in chair;with call bell/phone within reach  OT Visit Diagnosis: Pain Pain - Right/Left: Right Pain - part of body: Knee                Time: 1761-6073 OT Time Calculation (min): 20 min Charges:  OT General Charges $OT Visit: 1 Procedure OT Evaluation $OT Eval Low Complexity: 1 Procedure G-Codes:     Lesle Chris, OTR/L [phone] 03/29/2017  Clever Geraldo 03/29/2017, 9:50 AM

## 2017-03-29 NOTE — Progress Notes (Signed)
Discharge planning, no HH needs identified. Plan for OP PT, already arranged, has all needed DME. [phone]

## 2017-03-29 NOTE — Progress Notes (Signed)
   Subjective: 1 Day Post-Op Procedure(s) (LRB): RIGHT TOTAL KNEE ARTHROPLASTY (Right) Patient reports pain as mild.   Patient seen in rounds for Dr. Gladstone Lighter. Patient is well, and has had no acute complaints or problems. No issues overnight. No SOB or chest pain.    Objective: Vital signs in last 24 hours: Temp:  [97.5 F (36.4 C)-98.5 F (36.9 C)] 98 F (36.7 C) (08/23 0541) Pulse Rate:  [62-103] 62 (08/23 0541) Resp:  [11-25] 17 (08/23 0541) BP: (103-155)/(45-109) 117/66 (08/23 0541) SpO2:  [93 %-100 %] 95 % (08/23 0541)  Intake/Output from previous day:  Intake/Output Summary (Last 24 hours) at 03/29/17 0751 Last data filed at 03/29/17 0744  Gross per 24 hour  Intake          3832.67 ml  Output             2255 ml  Net          1577.67 ml    Intake/Output this shift: Total I/O In: 240 [P.O.:240] Out: -   Labs:  Recent Labs  03/29/17 0621  HGB 11.4*    Recent Labs  03/29/17 0621  WBC 16.4*  RBC 3.79*  HCT 33.0*  PLT 232    Recent Labs  03/29/17 0621  NA 138  K 4.2  CL 104  CO2 25  BUN 18  CREATININE 0.81  GLUCOSE 134*  CALCIUM 8.6*    EXAM General - Patient is Alert and Oriented Extremity - Neurologically intact Intact pulses distally Dorsiflexion/Plantar flexion intact No cellulitis present Compartment soft Dressing - dressing C/D/I Motor Function - intact, moving foot and toes well on exam.  Hemovac pulled without difficulty.  Past Medical History:  Diagnosis Date  . Arthritis   . Constipation   . HTN (hypertension) 06/15/2015  . Pneumonia     Assessment/Plan: 1 Day Post-Op Procedure(s) (LRB): RIGHT TOTAL KNEE ARTHROPLASTY (Right) Active Problems:   Total knee replacement status, right  Estimated body mass index is 31.46 kg/m as calculated from the following:   Height as of this encounter: 5\' 2"  (1.575 m).   Weight as of this encounter: 78 kg (172 lb). Advance diet Up with therapy D/C IV fluids when tolerating POs  well  DVT Prophylaxis - Xarelto Weight-Bearing as tolerated  D/C O2 and Pulse OX and try on Room Air  She will continue with therapy. Plan for DC home tomorrow pending progress with therapy.   Ardeen Jourdain, PA-C Orthopaedic Surgery 03/29/2017, 7:51 AM

## 2017-03-29 NOTE — Progress Notes (Signed)
Physical Therapy Treatment Patient Details Name: Denise [name] MRN: [id number] DOB: 03-Sep-1945 Today's Date: 03/29/2017    History of Present Illness Pt s/p R TKR    PT Comments    Pt reports increased pain this pm but continues motivated and progressing with mobility.  Pt hopeful for dc home tomorrow.   Follow Up Recommendations  DC plan and follow up therapy as arranged by surgeon     Equipment Recommendations  Rolling walker with 5" wheels    Recommendations for Other Services OT consult     Precautions / Restrictions Precautions Precautions: Knee;Fall Required Braces or Orthoses: Knee Immobilizer - Right Knee Immobilizer - Right: Discontinue once straight leg raise with < 10 degree lag Restrictions Weight Bearing Restrictions: No Other Position/Activity Restrictions: WBAT    Mobility  Bed Mobility Overal bed mobility: Needs Assistance Bed Mobility: Supine to Sit;Sit to Supine     Supine to sit: Min assist Sit to supine: Min assist   General bed mobility comments: cues for sequence and min assist to manage R LE  Transfers Overall transfer level: Needs assistance Equipment used: Rolling walker (2 wheeled) Transfers: Sit to/from Stand Sit to Stand: Min guard         General transfer comment: cues for LE management and use of UEs to self assist  Ambulation/Gait Ambulation/Gait assistance: Min guard Ambulation Distance (Feet): 75 Feet Assistive device: Rolling walker (2 wheeled) Gait Pattern/deviations: Step-to pattern;Decreased step length - right;Decreased step length - left;Shuffle;Trunk flexed Gait velocity: decr Gait velocity interpretation: Below normal speed for age/gender General Gait Details: cues for sequence, posture and position from [employer]            Wheelchair Mobility    Modified Rankin (Stroke Patients Only)       Balance                                            Cognition Arousal/Alertness:  Awake/alert Behavior During Therapy: WFL for tasks assessed/performed Overall Cognitive Status: Within Functional Limits for tasks assessed                                        Exercises Total Joint Exercises Ankle Circles/Pumps: AROM;Both;15 reps;Supine Quad Sets: AROM;10 reps;Supine;Both Heel Slides: AAROM;Right;15 reps;Supine Straight Leg Raises: AAROM;Right;10 reps;Supine    General Comments        Pertinent Vitals/Pain Pain Assessment: 0-10 Pain Score: 6  Pain Location: R knee Pain Descriptors / Indicators: Aching;Sore Pain Intervention(s): Limited activity within patient's tolerance;Monitored during session;Premedicated before session;Ice applied    Home Living                      Prior Function            PT Goals (current goals can now be found in the care plan section) Acute Rehab PT Goals Patient Stated Goal: Regain IND PT Goal Formulation: With patient Time For Goal Achievement: 03/31/17 Potential to Achieve Goals: Good Progress towards PT goals: Progressing toward goals    Frequency    7X/week      PT Plan Current plan remains appropriate    Co-evaluation              AM-PAC PT "6 Clicks" Daily Activity  Outcome Measure  Difficulty turning over in bed (including adjusting bedclothes, sheets and blankets)?: Unable Difficulty moving from lying on back to sitting on the side of the bed? : Unable Difficulty sitting down on and standing up from a chair with arms (e.g., wheelchair, bedside commode, etc,.)?: Unable Help needed moving to and from a bed to chair (including a wheelchair)?: A Little Help needed walking in hospital room?: A Little Help needed climbing 3-5 steps with a railing? : A Little 6 Click Score: 12    End of Session Equipment Utilized During Treatment: Gait belt;Right knee immobilizer Activity Tolerance: Patient tolerated treatment well Patient left: in bed;with call bell/phone within reach;with  family/visitor present Nurse Communication: Mobility status PT Visit Diagnosis: Difficulty in walking, not elsewhere classified (R26.2)     Time: 4827-0786 PT Time Calculation (min) (ACUTE ONLY): 27 min  Charges:  $Gait Training: 8-22 mins $Therapeutic Exercise: 8-22 mins                    G Codes:       Pg [phone]    Colie Josten 03/29/2017, 2:00 PM

## 2017-03-30 LAB — BASIC METABOLIC PANEL
ANION GAP: 3 — AB (ref 5–15)
BUN: 23 mg/dL — ABNORMAL HIGH (ref 6–20)
CALCIUM: 8.2 mg/dL — AB (ref 8.9–10.3)
CHLORIDE: 108 mmol/L (ref 101–111)
CO2: 28 mmol/L (ref 22–32)
Creatinine, Ser: 0.89 mg/dL (ref 0.44–1.00)
GFR calc non Af Amer: >60 mL/min (ref >60)
Glucose, Bld: 107 mg/dL — ABNORMAL HIGH (ref 65–99)
Potassium: 4.2 mmol/L (ref 3.5–5.1)
Sodium: 139 mmol/L (ref 135–145)

## 2017-03-30 LAB — CBC
HEMATOCRIT: 30.5 % — AB (ref 36.0–46.0)
HEMOGLOBIN: 10.1 g/dL — AB (ref 12.0–15.0)
MCH: 30 pg (ref 26.0–34.0)
MCHC: 33.1 g/dL (ref 30.0–36.0)
MCV: 90.5 fL (ref 78.0–100.0)
Platelets: 206 10*3/uL (ref 150–400)
RBC: 3.37 MIL/uL — ABNORMAL LOW (ref 3.87–5.11)
RDW: 12.9 % (ref 11.5–15.5)
WBC: 11.8 10*3/uL — AB (ref 4.0–10.5)

## 2017-03-30 MED ORDER — FERROUS SULFATE 325 (65 FE) MG PO TABS: 325.0000 mg | ORAL_TABLET | Freq: Two times a day (BID) | ORAL | 0 refills | Status: DC

## 2017-03-30 NOTE — Progress Notes (Signed)
Physical Therapy Treatment Patient Details Name: Denise [name] MRN: [id number] DOB: 1946-01-02 Today's Date: 03/30/2017    History of Present Illness Pt s/p R TKR    PT Comments    Pt motivated and progressing well with mobility.  Spouse present and reviewed stair, home therex with written instruction, and don/doff KI.     Follow Up Recommendations  DC plan and follow up therapy as arranged by surgeon     Equipment Recommendations       Recommendations for Other Services OT consult     Precautions / Restrictions Precautions Precautions: Knee;Fall Required Braces or Orthoses: Knee Immobilizer - Right Knee Immobilizer - Right: Discontinue once straight leg raise with < 10 degree lag Restrictions Weight Bearing Restrictions: No Other Position/Activity Restrictions: WBAT    Mobility  Bed Mobility Overal bed mobility: Needs Assistance Bed Mobility: Supine to Sit     Supine to sit: Min guard     General bed mobility comments: pt self assisting R LE with UEs  Transfers Overall transfer level: Needs assistance Equipment used: Rolling walker (2 wheeled) Transfers: Sit to/from Stand Sit to Stand: Min guard;Supervision         General transfer comment: cues for LE management and use of UEs to self assist  Ambulation/Gait Ambulation/Gait assistance: Min guard;Supervision Ambulation Distance (Feet): 150 Feet Assistive device: Rolling walker (2 wheeled) Gait Pattern/deviations: Decreased step length - right;Decreased step length - left;Shuffle;Trunk flexed;Step-to pattern;Step-through pattern Gait velocity: decr Gait velocity interpretation: Below normal speed for age/gender General Gait Details: min cues for sequence, posture and position from RW   Stairs Stairs: Yes   Stair Management: No rails;Step to pattern;Forwards;With walker Number of Stairs: 2 General stair comments: single step twice with RW and cues for sequence and foot/RW placement  Wheelchair  Mobility    Modified Rankin (Stroke Patients Only)       Balance                                            Cognition Arousal/Alertness: Awake/alert Behavior During Therapy: WFL for tasks assessed/performed Overall Cognitive Status: Within Functional Limits for tasks assessed                                        Exercises Total Joint Exercises Ankle Circles/Pumps: AROM;Both;15 reps;Supine Quad Sets: AROM;Supine;Both;15 reps Heel Slides: AAROM;Right;Supine;20 reps Straight Leg Raises: AAROM;Right;Supine;20 reps Goniometric ROM: AAROM at R knee -10 - 50    General Comments        Pertinent Vitals/Pain Pain Assessment: 0-10 Pain Score: 7  Pain Location: R knee Pain Descriptors / Indicators: Aching;Sore Pain Intervention(s): Limited activity within patient's tolerance;Monitored during session;Premedicated before session;Ice applied    Home Living                      Prior Function            PT Goals (current goals can now be found in the care plan section) Acute Rehab PT Goals Patient Stated Goal: Regain IND PT Goal Formulation: With patient Time For Goal Achievement: 03/31/17 Potential to Achieve Goals: Good Progress towards PT goals: Progressing toward goals    Frequency    7X/week      PT Plan Current plan remains appropriate  Co-evaluation              AM-PAC PT "6 Clicks" Daily Activity  Outcome Measure  Difficulty turning over in bed (including adjusting bedclothes, sheets and blankets)?: A Lot Difficulty moving from lying on back to sitting on the side of the bed? : A Lot Difficulty sitting down on and standing up from a chair with arms (e.g., wheelchair, bedside commode, etc,.)?: A Little Help needed moving to and from a bed to chair (including a wheelchair)?: A Little Help needed walking in hospital room?: A Little Help needed climbing 3-5 steps with a railing? : A Little 6 Click Score:  16    End of Session Equipment Utilized During Treatment: Gait belt;Right knee immobilizer Activity Tolerance: Patient tolerated treatment well Patient left: in chair;with call bell/phone within reach;with family/visitor present Nurse Communication: Mobility status PT Visit Diagnosis: Difficulty in walking, not elsewhere classified (R26.2)     Time: 9758-8325 PT Time Calculation (min) (ACUTE ONLY): 46 min  Charges:  $Gait Training: 8-22 mins $Therapeutic Exercise: 8-22 mins $Therapeutic Activity: 8-22 mins                    G Codes:       Pg [phone]    Rema Lievanos 03/30/2017, 9:31 AM

## 2017-03-30 NOTE — Progress Notes (Signed)
   Subjective: 2 Days Post-Op Procedure(s) (LRB): RIGHT TOTAL KNEE ARTHROPLASTY (Right) Patient reports pain as mild.   Patient seen in rounds for Dr. Gladstone Lighter. Patient is well, and has had no acute complaints or problems. She reports that she is tolerating the pain medication well. No issues overnight. No SOB or chest pain. Voiding well. Positive flatus.   Objective: Vital signs in last 24 hours: Temp:  [98 F (36.7 C)-98.4 F (36.9 C)] 98.2 F (36.8 C) (08/24 0622) Pulse Rate:  [64-76] 64 (08/24 0622) Resp:  [16] 16 (08/24 0622) BP: (113-134)/(52-69) 126/69 (08/24 0622) SpO2:  [97 %-98 %] 97 % (08/24 0622)  Intake/Output from previous day:  Intake/Output Summary (Last 24 hours) at 03/30/17 0733 Last data filed at 03/30/17 0600  Gross per 24 hour  Intake            920.3 ml  Output             1650 ml  Net           -729.7 ml     Labs:  Recent Labs  03/29/17 0621 03/30/17 0549  HGB 11.4* 10.1*    Recent Labs  03/29/17 0621 03/30/17 0549  WBC 16.4* 11.8*  RBC 3.79* 3.37*  HCT 33.0* 30.5*  PLT 232 206    Recent Labs  03/29/17 0621 03/30/17 0549  NA 138 139  K 4.2 4.2  CL 104 108  CO2 25 28  BUN 18 23*  CREATININE 0.81 0.89  GLUCOSE 134* 107*  CALCIUM 8.6* 8.2*    EXAM General - Patient is Alert and Oriented Extremity - Neurologically intact Intact pulses distally Dorsiflexion/Plantar flexion intact No cellulitis present Compartment soft Dressing/Incision - clean, dry, no drainage Motor Function - intact, moving foot and toes well on exam.   Past Medical History:  Diagnosis Date  . Arthritis   . Constipation   . HTN (hypertension) 06/15/2015  . Pneumonia     Assessment/Plan: 2 Days Post-Op Procedure(s) (LRB): RIGHT TOTAL KNEE ARTHROPLASTY (Right) Active Problems:   Total knee replacement status, right  Estimated body mass index is 31.46 kg/m as calculated from the following:   Height as of this encounter: 5\' 2"  (1.575 m).   Weight  as of this encounter: 78 kg (172 lb). Advance diet Up with therapy  DVT Prophylaxis - Xarelto; will transition to aspirin 325mg  BID upon DC Weight-Bearing as tolerated   She is progressing well with therapy. Will continue therapy today. Plan for DC home today with plan to start outpatient therapy. Follow up in 2 weeks.   Ardeen Jourdain, PA-C Orthopaedic Surgery 03/30/2017, 7:33 AM

## 2017-03-30 NOTE — Progress Notes (Signed)
OT Note:  Pt feels like she will be OK with shower:  Didn't feel she needed to practice. Verbally reviewed sequence.  [place], [place] [phone] 03/30/2017

## 2017-04-02 DIAGNOSIS — Z471 Aftercare following joint replacement surgery: Secondary | ICD-10-CM | POA: Diagnosis not present

## 2017-04-02 DIAGNOSIS — R2689 Other abnormalities of gait and mobility: Secondary | ICD-10-CM | POA: Diagnosis not present

## 2017-04-02 DIAGNOSIS — Z96651 Presence of right artificial knee joint: Secondary | ICD-10-CM | POA: Diagnosis not present

## 2017-04-02 DIAGNOSIS — M6281 Muscle weakness (generalized): Secondary | ICD-10-CM | POA: Diagnosis not present

## 2017-04-02 NOTE — Discharge Summary (Signed)
Physician Discharge Summary   Patient ID: Denise [name] MRN: [id number] DOB/AGE: 1945-08-30 71 y.o.  Admit date: 03/28/2017 Discharge date: 03/30/2017  Primary Diagnosis: Primary osteoarthritis right knee   Admission Diagnoses:  Past Medical History:  Diagnosis Date  . Arthritis   . Constipation   . HTN (hypertension) 06/15/2015  . Pneumonia    Discharge Diagnoses:   Active Problems:   Total knee replacement status, right  Estimated body mass index is 31.46 kg/m as calculated from the following:   Height as of this encounter: 5' 2"  (1.575 m).   Weight as of this encounter: 78 kg (172 lb).  Procedure:  Procedure(s) (LRB): RIGHT TOTAL KNEE ARTHROPLASTY (Right)   Consults: None  HPI: Denise [name], 71 y.o. female, has a history of pain and functional disability in the right knee due to arthritis and has failed non-surgical conservative treatments for greater than 12 weeks to includeNSAID's and/or analgesics, corticosteriod injections, flexibility and strengthening excercises, use of assistive devices and activity modification.  Onset of symptoms was gradual, starting 3 years ago with gradually worsening course since that time. The patient noted prior procedures on the knee to include  arthroscopy and menisectomy on the right knee(s).  Patient currently rates pain in the right knee(s) at 8 out of 10 with activity. Patient has night pain, worsening of pain with activity and weight bearing, pain that interferes with activities of daily living, pain with passive range of motion, crepitus and joint swelling.  Patient has evidence of periarticular osteophytes and joint space narrowing by imaging studies. There is no active infection.  Laboratory Data: Admission on 03/28/2017, Discharged on 03/30/2017  Component Date Value Ref Range Status  . WBC 03/29/2017 16.4* 4.0 - 10.5 K/uL Final  . RBC 03/29/2017 3.79* 3.87 - 5.11 MIL/uL Final  . Hemoglobin 03/29/2017 11.4* 12.0 - 15.0 g/dL  Final  . HCT 03/29/2017 33.0* 36.0 - 46.0 % Final  . MCV 03/29/2017 87.1  78.0 - 100.0 fL Final  . MCH 03/29/2017 30.1  26.0 - 34.0 pg Final  . MCHC 03/29/2017 34.5  30.0 - 36.0 g/dL Final  . RDW 03/29/2017 12.4  11.5 - 15.5 % Final  . Platelets 03/29/2017 232  150 - 400 K/uL Final  . Sodium 03/29/2017 138  135 - 145 mmol/L Final  . Potassium 03/29/2017 4.2  3.5 - 5.1 mmol/L Final  . Chloride 03/29/2017 104  101 - 111 mmol/L Final  . CO2 03/29/2017 25  22 - 32 mmol/L Final  . Glucose, Bld 03/29/2017 134* 65 - 99 mg/dL Final  . BUN 03/29/2017 18  6 - 20 mg/dL Final  . Creatinine, Ser 03/29/2017 0.81  0.44 - 1.00 mg/dL Final  . Calcium 03/29/2017 8.6* 8.9 - 10.3 mg/dL Final  . GFR calc non Af Amer 03/29/2017 >60  >60 mL/min Final  . GFR calc Af Amer 03/29/2017 >60  >60 mL/min Final   Comment: (NOTE) The eGFR has been calculated using the CKD EPI equation. This calculation has not been validated in all clinical situations. eGFR's persistently <60 mL/min signify possible Chronic Kidney Disease.   . Anion gap 03/29/2017 9  5 - 15 Final  . WBC 03/30/2017 11.8* 4.0 - 10.5 K/uL Final  . RBC 03/30/2017 3.37* 3.87 - 5.11 MIL/uL Final  . Hemoglobin 03/30/2017 10.1* 12.0 - 15.0 g/dL Final  . HCT 03/30/2017 30.5* 36.0 - 46.0 % Final  . MCV 03/30/2017 90.5  78.0 - 100.0 fL Final  . MCH 03/30/2017 30.0  26.0 - 34.0 pg Final  . MCHC 03/30/2017 33.1  30.0 - 36.0 g/dL Final  . RDW 03/30/2017 12.9  11.5 - 15.5 % Final  . Platelets 03/30/2017 206  150 - 400 K/uL Final  . Sodium 03/30/2017 139  135 - 145 mmol/L Final  . Potassium 03/30/2017 4.2  3.5 - 5.1 mmol/L Final  . Chloride 03/30/2017 108  101 - 111 mmol/L Final  . CO2 03/30/2017 28  22 - 32 mmol/L Final  . Glucose, Bld 03/30/2017 107* 65 - 99 mg/dL Final  . BUN 03/30/2017 23* 6 - 20 mg/dL Final  . Creatinine, Ser 03/30/2017 0.89  0.44 - 1.00 mg/dL Final  . Calcium 03/30/2017 8.2* 8.9 - 10.3 mg/dL Final  . GFR calc non Af Amer 03/30/2017  >60  >60 mL/min Final  . GFR calc Af Amer 03/30/2017 >60  >60 mL/min Final   Comment: (NOTE) The eGFR has been calculated using the CKD EPI equation. This calculation has not been validated in all clinical situations. eGFR's persistently <60 mL/min signify possible Chronic Kidney Disease.   Georgiann Hahn gap 03/30/2017 3* 5 - 15 Final  Hospital Outpatient Visit on 03/20/2017  Component Date Value Ref Range Status  . aPTT 03/20/2017 29  24 - 36 seconds Final  . Prothrombin Time 03/20/2017 12.6  11.4 - 15.2 seconds Final  . INR 03/20/2017 0.94   Final  . ABO/RH(D) 03/20/2017 A POS   Final  . Antibody Screen 03/20/2017 NEG   Final  . Sample Expiration 03/20/2017 03/31/2017   Final  . Extend sample reason 03/20/2017 NO TRANSFUSIONS OR PREGNANCY IN THE PAST 3 MONTHS   Final  . Color, Urine 03/20/2017 Shantinique Picazo* YELLOW Final   BIOCHEMICALS MAY BE AFFECTED BY COLOR  . APPearance 03/20/2017 CLOUDY* CLEAR Final  . Specific Gravity, Urine 03/20/2017 1.027  1.005 - 1.030 Final  . pH 03/20/2017 5.0  5.0 - 8.0 Final  . Glucose, UA 03/20/2017 NEGATIVE  NEGATIVE mg/dL Final  . Hgb urine dipstick 03/20/2017 NEGATIVE  NEGATIVE Final  . Bilirubin Urine 03/20/2017 NEGATIVE  NEGATIVE Final  . Ketones, ur 03/20/2017 NEGATIVE  NEGATIVE mg/dL Final  . Protein, ur 03/20/2017 NEGATIVE  NEGATIVE mg/dL Final  . Nitrite 03/20/2017 NEGATIVE  NEGATIVE Final  . Leukocytes, UA 03/20/2017 SMALL* NEGATIVE Final  . RBC / HPF 03/20/2017 6-30  0 - 5 RBC/hpf Final  . WBC, UA 03/20/2017 0-5  0 - 5 WBC/hpf Final  . Bacteria, UA 03/20/2017 FEW* NONE SEEN Final  . Squamous Epithelial / LPF 03/20/2017 0-5* NONE SEEN Final  . Mucus 03/20/2017 PRESENT   Final  . Hyaline Casts, UA 03/20/2017 PRESENT   Final  . ABO/RH(D) 03/20/2017 A POS   Final     EKG: Orders placed or performed in visit on 06/15/15  . EKG 12-Lead     Hospital Course: Denise [name] is a 71 y.o. who was admitted to [facility]. They were brought  to the operating room on 03/28/2017 and underwent Procedure(s): RIGHT TOTAL KNEE ARTHROPLASTY.  Patient tolerated the procedure well and was later transferred to the recovery room and then to the orthopaedic floor for postoperative care.  They were given PO and IV analgesics for pain control following their surgery.  They were given 24 hours of postoperative antibiotics of  Anti-infectives    Start     Dose/Rate Route Frequency Ordered Stop   03/28/17 1400  ceFAZolin (ANCEF) IVPB 1 g/50 mL premix     1 g  100 mL/hr over 30 Minutes Intravenous Every 6 hours 03/28/17 1110 03/28/17 2053   03/28/17 0806  polymyxin B 500,000 Units, bacitracin 50,000 Units in sodium chloride 0.9 % 500 mL irrigation  Status:  Discontinued       As needed 03/28/17 0806 03/28/17 0931   03/28/17 0646  ceFAZolin (ANCEF) 2-4 GM/100ML-% IVPB    Comments:  Key, Kristopher   : cabinet override      03/28/17 0646 03/28/17 0741   03/28/17 0535  ceFAZolin (ANCEF) IVPB 2g/100 mL premix     2 g 200 mL/hr over 30 Minutes Intravenous On call to O.R. 03/28/17 2119 03/28/17 4174     and started on DVT prophylaxis in the form of Xarelto.   PT and OT were ordered for total joint protocol.  Discharge planning consulted to help with postop disposition and equipment needs.  Patient had a fair night on the evening of surgery.  She had some itching with medication but saw resolution with Benadryl. They started to get up OOB with therapy on day one. Hemovac drain was pulled without difficulty.  Continued to work with therapy into day two.  Dressing was changed on day two and the incision was clean and dry. The patient had progressed with therapy and meeting their goals.  Incision was healing well.  Patient was seen in rounds and was ready to go home.   Diet: Cardiac diet Activity:WBAT Follow-up:in 2 weeks Disposition - Home Discharged Condition: stable   Discharge Instructions    Call MD / Call 911    Complete by:  As directed    If you  experience chest pain or shortness of breath, CALL 911 and be transported to the hospital emergency room.  If you develope a fever above 101 F, pus (white drainage) or increased drainage or redness at the wound, or calf pain, call your surgeon's office.   Constipation Prevention    Complete by:  As directed    Drink plenty of fluids.  Prune juice may be helpful.  You may use a stool softener, such as Colace (over the counter) 100 mg twice a day.  Use MiraLax (over the counter) for constipation as needed.   Diet - low sodium heart healthy    Complete by:  As directed    Discharge instructions    Complete by:  As directed    INSTRUCTIONS AFTER JOINT REPLACEMENT   Remove items at home which could result in a fall. This includes throw rugs or furniture in walking pathways ICE to the affected joint every three hours while awake for 30 minutes at a time, for at least the first 3-5 days, and then as needed for pain and swelling.  Continue to use ice for pain and swelling. You may notice swelling that will progress down to the foot and ankle.  This is normal after surgery.  Elevate your leg when you are not up walking on it.   Continue to use the breathing machine you got in the hospital (incentive spirometer) which will help keep your temperature down.  It is common for your temperature to cycle up and down following surgery, especially at night when you are not up moving around and exerting yourself.  The breathing machine keeps your lungs expanded and your temperature down.   DIET:  As you were doing prior to hospitalization, we recommend a well-balanced diet.  DRESSING / WOUND CARE / SHOWERING  You may change your dressing every day with sterile gauze.  Please use good hand washing techniques before changing the dressing.  Do not use any lotions or creams on the incision until instructed by your surgeon.  ACTIVITY  Increase activity slowly as tolerated, but follow the weight bearing instructions  below.   No driving for 6 weeks or until further direction given by your physician.  You cannot drive while taking narcotics.  No lifting or carrying greater than 10 lbs. until further directed by your surgeon. Avoid periods of inactivity such as sitting longer than an hour when not asleep. This helps prevent blood clots.  You may return to work once you are authorized by your doctor.     WEIGHT BEARING   Weight bearing as tolerated with assist device (walker, cane, etc) as directed, use it as long as suggested by your surgeon or therapist, typically at least 4-6 weeks.   EXERCISES  Results after joint replacement surgery are often greatly improved when you follow the exercise, range of motion and muscle strengthening exercises prescribed by your doctor. Safety measures are also important to protect the joint from further injury. Any time any of these exercises cause you to have increased pain or swelling, decrease what you are doing until you are comfortable again and then slowly increase them. If you have problems or questions, call your caregiver or physical therapist for advice.   Rehabilitation is important following a joint replacement. After just a few days of immobilization, the muscles of the leg can become weakened and shrink (atrophy).  These exercises are designed to build up the tone and strength of the thigh and leg muscles and to improve motion. Often times heat used for twenty to thirty minutes before working out will loosen up your tissues and help with improving the range of motion but do not use heat for the first two weeks following surgery (sometimes heat can increase post-operative swelling).   These exercises can be done on a training (exercise) mat, on the floor, on a table or on a bed. Use whatever works the best and is most comfortable for you.    Use music or television while you are exercising so that the exercises are a pleasant break in your day. This will make your  life better with the exercises acting as a break in your routine that you can look forward to.   Perform all exercises about fifteen times, three times per day or as directed.  You should exercise both the operative leg and the other leg as well.  Exercises include:   Quad Sets - Tighten up the muscle on the front of the thigh (Quad) and hold for 5-10 seconds.   Straight Leg Raises - With your knee straight (if you were given a brace, keep it on), lift the leg to 60 degrees, hold for 3 seconds, and slowly lower the leg.  Perform this exercise against resistance later as your leg gets stronger.  Leg Slides: Lying on your back, slowly slide your foot toward your buttocks, bending your knee up off the floor (only go as far as is comfortable). Then slowly slide your foot back down until your leg is flat on the floor again.  Angel Wings: Lying on your back spread your legs to the side as far apart as you can without causing discomfort.  Hamstring Strength:  Lying on your back, push your heel against the floor with your leg straight by tightening up the muscles of your buttocks.  Repeat, but this time bend your knee  to a comfortable angle, and push your heel against the floor.  You may put a pillow under the heel to make it more comfortable if necessary.   A rehabilitation program following joint replacement surgery can speed recovery and prevent re-injury in the future due to weakened muscles. Contact your doctor or a physical therapist for more information on knee rehabilitation.    CONSTIPATION  Constipation is defined medically as fewer than three stools per week and severe constipation as less than one stool per week.  Even if you have a regular bowel pattern at home, your normal regimen is likely to be disrupted due to multiple reasons following surgery.  Combination of anesthesia, postoperative narcotics, change in appetite and fluid intake all can affect your bowels.   YOU MUST use at least one of  the following options; they are listed in order of increasing strength to get the job done.  They are all available over the counter, and you may need to use some, POSSIBLY even all of these options:    Drink plenty of fluids (prune juice may be helpful) and high fiber foods Colace 100 mg by mouth twice a day  Senokot for constipation as directed and as needed Dulcolax (bisacodyl), take with full glass of water  Miralax (polyethylene glycol) once or twice a day as needed.  If you have tried all these things and are unable to have a bowel movement in the first 3-4 days after surgery call either your surgeon or your primary doctor.    If you experience loose stools or diarrhea, hold the medications until you stool forms back up.  If your symptoms do not get better within 1 week or if they get worse, check with your doctor.  If you experience "the worst abdominal pain ever" or develop nausea or vomiting, please contact the office immediately for further recommendations for treatment.   ITCHING:  If you experience itching with your medications, try taking only a single pain pill, or even half a pain pill at a time.  You can also use Benadryl over the counter for itching or also to help with sleep.   TED HOSE STOCKINGS:  Use stockings on both legs until for at least 2 weeks or as directed by physician office. They may be removed at night for sleeping.  MEDICATIONS:  See your medication summary on the "After Visit Summary" that nursing will review with you.  You may have some home medications which will be placed on hold until you complete the course of blood thinner medication.  It is important for you to complete the blood thinner medication as prescribed.  PRECAUTIONS:  If you experience chest pain or shortness of breath - call 911 immediately for transfer to the hospital emergency department.   If you develop a fever greater that 101 F, purulent drainage from wound, increased redness or drainage  from wound, foul odor from the wound/dressing, or calf pain - CONTACT YOUR SURGEON.                                                   FOLLOW-UP APPOINTMENTS:  If you do not already have a post-op appointment, please call the office for an appointment to be seen by your surgeon.  Guidelines for how soon to be seen are listed in your "After Visit Summary", but  are typically between 1-4 weeks after surgery.   MAKE SURE YOU:  Understand these instructions.  Get help right away if you are not doing well or get worse.    Thank you for letting us be a part of your medical care team.  It is a privilege we respect greatly.  We hope these instructions will help you stay on track for a fast and full recovery!   Increase activity slowly as tolerated    Complete by:  As directed      Allergies as of 03/30/2017   No Known Allergies     Medication List    STOP taking these medications   traMADol 50 MG tablet Commonly known as:  ULTRAM     TAKE these medications   aspirin EC 325 MG tablet Take 1 tablet (325 mg total) by mouth 2 (two) times daily.   CALCIUM 600 + D PO Take 1 tablet by mouth daily.   clobetasol cream 0.05 % Commonly known as:  TEMOVATE Apply 1 application topically See admin instructions. USES AS NEEDED, PUTS ON EVERY TIME SHE GOES TO THE BATHROOM WHEN LICHEN SCLEROSUS IS ACTING UP   diphenhydramine-acetaminophen 25-500 MG Tabs tablet Commonly known as:  TYLENOL PM Take 1 tablet by mouth at bedtime as needed (SLEEP).   ferrous sulfate 325 (65 FE) MG tablet Take 1 tablet (325 mg total) by mouth 2 (two) times daily with a meal.   HYDROcodone-acetaminophen 5-325 MG tablet Commonly known as:  NORCO/VICODIN Take 1-2 tablets by mouth every 4 (four) hours as needed (breakthrough pain).   meloxicam 15 MG tablet Commonly known as:  MOBIC Take 15 mg by mouth daily.   methocarbamol 500 MG tablet Commonly known as:  ROBAXIN Take 1 tablet (500 mg total) by mouth every 6 (six)  hours as needed for muscle spasms.   multivitamin tablet Take 1 tablet by mouth daily.   omeprazole 20 MG capsule Commonly known as:  PRILOSEC Take 1 capsule (20 mg total) by mouth daily.   PREMARIN vaginal cream Generic drug:  conjugated estrogens Place 1 application vaginally daily as needed. DRYNESS   telmisartan 40 MG tablet Commonly known as:  MICARDIS Take 40 mg by mouth daily.   vitamin C 500 MG tablet Commonly known as:  ASCORBIC ACID Take 500 mg by mouth daily.            Discharge Care Instructions        Start     Ordered   03/30/17 0000  ferrous sulfate 325 (65 FE) MG tablet  2 times daily with meals     03/30/17 0733   03/29/17 0000  HYDROcodone-acetaminophen (NORCO/VICODIN) 5-325 MG tablet  Every 4 hours PRN     03/29/17 0757   03/29/17 0000  methocarbamol (ROBAXIN) 500 MG tablet  Every 6 hours PRN     03/29/17 0757   03/29/17 0000  aspirin EC 325 MG tablet  2 times daily     03/29/17 0757   03/29/17 0000  Call MD / Call 911    Comments:  If you experience chest pain or shortness of breath, CALL 911 and be transported to the hospital emergency room.  If you develope a fever above 101 F, pus (white drainage) or increased drainage or redness at the wound, or calf pain, call your surgeon's office.   03/29/17 0757   03/29/17 0000  Diet - low sodium heart healthy     03/29/17 0757   03/29/17 0000  Constipation Prevention    Comments:  Drink plenty of fluids.  Prune juice may be helpful.  You may use a stool softener, such as Colace (over the counter) 100 mg twice a day.  Use MiraLax (over the counter) for constipation as needed.   03/29/17 0757   03/29/17 0000  Increase activity slowly as tolerated     03/29/17 0757   03/29/17 0000  Discharge instructions    Comments:  INSTRUCTIONS AFTER JOINT REPLACEMENT   Remove items at home which could result in a fall. This includes throw rugs or furniture in walking pathways ICE to the affected joint every three  hours while awake for 30 minutes at a time, for at least the first 3-5 days, and then as needed for pain and swelling.  Continue to use ice for pain and swelling. You may notice swelling that will progress down to the foot and ankle.  This is normal after surgery.  Elevate your leg when you are not up walking on it.   Continue to use the breathing machine you got in the hospital (incentive spirometer) which will help keep your temperature down.  It is common for your temperature to cycle up and down following surgery, especially at night when you are not up moving around and exerting yourself.  The breathing machine keeps your lungs expanded and your temperature down.   DIET:  As you were doing prior to hospitalization, we recommend a well-balanced diet.  DRESSING / WOUND CARE / SHOWERING  You may change your dressing every day with sterile gauze.  Please use good hand washing techniques before changing the dressing.  Do not use any lotions or creams on the incision until instructed by your surgeon.  ACTIVITY  Increase activity slowly as tolerated, but follow the weight bearing instructions below.   No driving for 6 weeks or until further direction given by your physician.  You cannot drive while taking narcotics.  No lifting or carrying greater than 10 lbs. until further directed by your surgeon. Avoid periods of inactivity such as sitting longer than an hour when not asleep. This helps prevent blood clots.  You may return to work once you are authorized by your doctor.     WEIGHT BEARING   Weight bearing as tolerated with assist device (walker, cane, etc) as directed, use it as long as suggested by your surgeon or therapist, typically at least 4-6 weeks.   EXERCISES  Results after joint replacement surgery are often greatly improved when you follow the exercise, range of motion and muscle strengthening exercises prescribed by your doctor. Safety measures are also important to protect the  joint from further injury. Any time any of these exercises cause you to have increased pain or swelling, decrease what you are doing until you are comfortable again and then slowly increase them. If you have problems or questions, call your caregiver or physical therapist for advice.   Rehabilitation is important following a joint replacement. After just a few days of immobilization, the muscles of the leg can become weakened and shrink (atrophy).  These exercises are designed to build up the tone and strength of the thigh and leg muscles and to improve motion. Often times heat used for twenty to thirty minutes before working out will loosen up your tissues and help with improving the range of motion but do not use heat for the first two weeks following surgery (sometimes heat can increase post-operative swelling).   These exercises can be done  on a training (exercise) mat, on the floor, on a table or on a bed. Use whatever works the best and is most comfortable for you.    Use music or television while you are exercising so that the exercises are a pleasant break in your day. This will make your life better with the exercises acting as a break in your routine that you can look forward to.   Perform all exercises about fifteen times, three times per day or as directed.  You should exercise both the operative leg and the other leg as well.  Exercises include:   Quad Sets - Tighten up the muscle on the front of the thigh (Quad) and hold for 5-10 seconds.   Straight Leg Raises - With your knee straight (if you were given a brace, keep it on), lift the leg to 60 degrees, hold for 3 seconds, and slowly lower the leg.  Perform this exercise against resistance later as your leg gets stronger.  Leg Slides: Lying on your back, slowly slide your foot toward your buttocks, bending your knee up off the floor (only go as far as is comfortable). Then slowly slide your foot back down until your leg is flat on the floor  again.  Angel Wings: Lying on your back spread your legs to the side as far apart as you can without causing discomfort.  Hamstring Strength:  Lying on your back, push your heel against the floor with your leg straight by tightening up the muscles of your buttocks.  Repeat, but this time bend your knee to a comfortable angle, and push your heel against the floor.  You may put a pillow under the heel to make it more comfortable if necessary.   A rehabilitation program following joint replacement surgery can speed recovery and prevent re-injury in the future due to weakened muscles. Contact your doctor or a physical therapist for more information on knee rehabilitation.    CONSTIPATION  Constipation is defined medically as fewer than three stools per week and severe constipation as less than one stool per week.  Even if you have a regular bowel pattern at home, your normal regimen is likely to be disrupted due to multiple reasons following surgery.  Combination of anesthesia, postoperative narcotics, change in appetite and fluid intake all can affect your bowels.   YOU MUST use at least one of the following options; they are listed in order of increasing strength to get the job done.  They are all available over the counter, and you may need to use some, POSSIBLY even all of these options:    Drink plenty of fluids (prune juice may be helpful) and high fiber foods Colace 100 mg by mouth twice a day  Senokot for constipation as directed and as needed Dulcolax (bisacodyl), take with full glass of water  Miralax (polyethylene glycol) once or twice a day as needed.  If you have tried all these things and are unable to have a bowel movement in the first 3-4 days after surgery call either your surgeon or your primary doctor.    If you experience loose stools or diarrhea, hold the medications until you stool forms back up.  If your symptoms do not get better within 1 week or if they get worse, check with  your doctor.  If you experience "the worst abdominal pain ever" or develop nausea or vomiting, please contact the office immediately for further recommendations for treatment.   ITCHING:  If you experience  itching with your medications, try taking only a single pain pill, or even half a pain pill at a time.  You can also use Benadryl over the counter for itching or also to help with sleep.   TED HOSE STOCKINGS:  Use stockings on both legs until for at least 2 weeks or as directed by physician office. They may be removed at night for sleeping.  MEDICATIONS:  See your medication summary on the "After Visit Summary" that nursing will review with you.  You may have some home medications which will be placed on hold until you complete the course of blood thinner medication.  It is important for you to complete the blood thinner medication as prescribed.  PRECAUTIONS:  If you experience chest pain or shortness of breath - call 911 immediately for transfer to the hospital emergency department.   If you develop a fever greater that 101 F, purulent drainage from wound, increased redness or drainage from wound, foul odor from the wound/dressing, or calf pain - CONTACT YOUR SURGEON.                                                   FOLLOW-UP APPOINTMENTS:  If you do not already have a post-op appointment, please call the office for an appointment to be seen by your surgeon.  Guidelines for how soon to be seen are listed in your "After Visit Summary", but are typically between 1-4 weeks after surgery.   MAKE SURE YOU:  Understand these instructions.  Get help right away if you are not doing well or get worse.    Thank you for letting us be a part of your medical care team.  It is a privilege we respect greatly.  We hope these instructions will help you stay on track for a fast and full recovery!   03/29/17 0757     Follow-up Information    Latanya Maudlin, MD. Schedule an appointment as soon as  possible for a visit in 2 week(s).   Specialty:  Orthopedic Surgery Contact information: [address] [place] [address] [phone]           Signed: Ardeen Jourdain, PA-C Orthopaedic Surgery 04/02/2017, 7:01 AM

## 2017-04-05 ENCOUNTER — Other Ambulatory Visit (HOSPITAL_COMMUNITY): Payer: Self-pay | Admitting: Orthopedic Surgery

## 2017-04-05 ENCOUNTER — Ambulatory Visit (HOSPITAL_COMMUNITY)
Admission: RE | Admit: 2017-04-05 | Discharge: 2017-04-05 | Disposition: A | Discharge status: Home / Self Care | Payer: Medicare Other | Source: Ambulatory Visit | Attending: Cardiology | Admitting: Cardiology

## 2017-04-05 ENCOUNTER — Encounter (HOSPITAL_COMMUNITY): Payer: Self-pay

## 2017-04-05 DIAGNOSIS — R609 Edema, unspecified: Secondary | ICD-10-CM

## 2017-04-05 DIAGNOSIS — M79604 Pain in right leg: Secondary | ICD-10-CM | POA: Insufficient documentation

## 2017-04-05 DIAGNOSIS — R52 Pain, unspecified: Secondary | ICD-10-CM

## 2017-04-05 DIAGNOSIS — M1711 Unilateral primary osteoarthritis, right knee: Secondary | ICD-10-CM | POA: Diagnosis not present

## 2017-04-05 DIAGNOSIS — M7989 Other specified soft tissue disorders: Secondary | ICD-10-CM | POA: Diagnosis not present

## 2017-04-05 NOTE — Progress Notes (Signed)
Today's right lower extremity venous duplex is negative for DVT. Preliminary results given to [name].

## 2017-04-06 DIAGNOSIS — R2689 Other abnormalities of gait and mobility: Secondary | ICD-10-CM | POA: Diagnosis not present

## 2017-04-06 DIAGNOSIS — M6281 Muscle weakness (generalized): Secondary | ICD-10-CM | POA: Diagnosis not present

## 2017-04-06 DIAGNOSIS — Z471 Aftercare following joint replacement surgery: Secondary | ICD-10-CM | POA: Diagnosis not present

## 2017-04-06 DIAGNOSIS — Z96651 Presence of right artificial knee joint: Secondary | ICD-10-CM | POA: Diagnosis not present

## 2017-04-11 DIAGNOSIS — Z471 Aftercare following joint replacement surgery: Secondary | ICD-10-CM | POA: Diagnosis not present

## 2017-04-11 DIAGNOSIS — Z96651 Presence of right artificial knee joint: Secondary | ICD-10-CM | POA: Diagnosis not present

## 2017-04-11 DIAGNOSIS — M6281 Muscle weakness (generalized): Secondary | ICD-10-CM | POA: Diagnosis not present

## 2017-04-11 DIAGNOSIS — R2689 Other abnormalities of gait and mobility: Secondary | ICD-10-CM | POA: Diagnosis not present

## 2017-04-13 DIAGNOSIS — M6281 Muscle weakness (generalized): Secondary | ICD-10-CM | POA: Diagnosis not present

## 2017-04-13 DIAGNOSIS — Z96651 Presence of right artificial knee joint: Secondary | ICD-10-CM | POA: Diagnosis not present

## 2017-04-13 DIAGNOSIS — Z471 Aftercare following joint replacement surgery: Secondary | ICD-10-CM | POA: Diagnosis not present

## 2017-04-13 DIAGNOSIS — R2689 Other abnormalities of gait and mobility: Secondary | ICD-10-CM | POA: Diagnosis not present

## 2017-04-16 DIAGNOSIS — M1711 Unilateral primary osteoarthritis, right knee: Secondary | ICD-10-CM | POA: Diagnosis not present

## 2017-04-17 DIAGNOSIS — R2689 Other abnormalities of gait and mobility: Secondary | ICD-10-CM | POA: Diagnosis not present

## 2017-04-17 DIAGNOSIS — Z96651 Presence of right artificial knee joint: Secondary | ICD-10-CM | POA: Diagnosis not present

## 2017-04-17 DIAGNOSIS — M6281 Muscle weakness (generalized): Secondary | ICD-10-CM | POA: Diagnosis not present

## 2017-04-17 DIAGNOSIS — Z471 Aftercare following joint replacement surgery: Secondary | ICD-10-CM | POA: Diagnosis not present

## 2017-04-19 DIAGNOSIS — M6281 Muscle weakness (generalized): Secondary | ICD-10-CM | POA: Diagnosis not present

## 2017-04-19 DIAGNOSIS — R2689 Other abnormalities of gait and mobility: Secondary | ICD-10-CM | POA: Diagnosis not present

## 2017-04-19 DIAGNOSIS — Z96651 Presence of right artificial knee joint: Secondary | ICD-10-CM | POA: Diagnosis not present

## 2017-04-19 DIAGNOSIS — Z471 Aftercare following joint replacement surgery: Secondary | ICD-10-CM | POA: Diagnosis not present

## 2017-04-23 DIAGNOSIS — L298 Other pruritus: Secondary | ICD-10-CM | POA: Diagnosis not present

## 2017-04-23 DIAGNOSIS — Z23 Encounter for immunization: Secondary | ICD-10-CM | POA: Diagnosis not present

## 2017-04-23 DIAGNOSIS — I10 Essential (primary) hypertension: Secondary | ICD-10-CM | POA: Diagnosis not present

## 2017-04-23 DIAGNOSIS — R0789 Other chest pain: Secondary | ICD-10-CM | POA: Diagnosis not present

## 2017-04-24 DIAGNOSIS — M6281 Muscle weakness (generalized): Secondary | ICD-10-CM | POA: Diagnosis not present

## 2017-04-24 DIAGNOSIS — Z471 Aftercare following joint replacement surgery: Secondary | ICD-10-CM | POA: Diagnosis not present

## 2017-04-24 DIAGNOSIS — R2689 Other abnormalities of gait and mobility: Secondary | ICD-10-CM | POA: Diagnosis not present

## 2017-04-24 DIAGNOSIS — Z96651 Presence of right artificial knee joint: Secondary | ICD-10-CM | POA: Diagnosis not present

## 2017-04-26 DIAGNOSIS — Z471 Aftercare following joint replacement surgery: Secondary | ICD-10-CM | POA: Diagnosis not present

## 2017-04-26 DIAGNOSIS — R2689 Other abnormalities of gait and mobility: Secondary | ICD-10-CM | POA: Diagnosis not present

## 2017-04-26 DIAGNOSIS — M6281 Muscle weakness (generalized): Secondary | ICD-10-CM | POA: Diagnosis not present

## 2017-04-26 DIAGNOSIS — Z96651 Presence of right artificial knee joint: Secondary | ICD-10-CM | POA: Diagnosis not present

## 2017-04-30 DIAGNOSIS — Z96651 Presence of right artificial knee joint: Secondary | ICD-10-CM | POA: Diagnosis not present

## 2017-04-30 DIAGNOSIS — Z471 Aftercare following joint replacement surgery: Secondary | ICD-10-CM | POA: Diagnosis not present

## 2017-05-01 DIAGNOSIS — R2689 Other abnormalities of gait and mobility: Secondary | ICD-10-CM | POA: Diagnosis not present

## 2017-05-01 DIAGNOSIS — M6281 Muscle weakness (generalized): Secondary | ICD-10-CM | POA: Diagnosis not present

## 2017-05-01 DIAGNOSIS — Z471 Aftercare following joint replacement surgery: Secondary | ICD-10-CM | POA: Diagnosis not present

## 2017-05-01 DIAGNOSIS — Z96651 Presence of right artificial knee joint: Secondary | ICD-10-CM | POA: Diagnosis not present

## 2017-05-07 DIAGNOSIS — L298 Other pruritus: Secondary | ICD-10-CM | POA: Diagnosis not present

## 2017-05-07 DIAGNOSIS — I10 Essential (primary) hypertension: Secondary | ICD-10-CM | POA: Diagnosis not present

## 2017-05-08 DIAGNOSIS — Z471 Aftercare following joint replacement surgery: Secondary | ICD-10-CM | POA: Diagnosis not present

## 2017-05-08 DIAGNOSIS — M6281 Muscle weakness (generalized): Secondary | ICD-10-CM | POA: Diagnosis not present

## 2017-05-08 DIAGNOSIS — R2689 Other abnormalities of gait and mobility: Secondary | ICD-10-CM | POA: Diagnosis not present

## 2017-05-08 DIAGNOSIS — Z96651 Presence of right artificial knee joint: Secondary | ICD-10-CM | POA: Diagnosis not present

## 2017-05-15 DIAGNOSIS — M6281 Muscle weakness (generalized): Secondary | ICD-10-CM | POA: Diagnosis not present

## 2017-05-15 DIAGNOSIS — Z471 Aftercare following joint replacement surgery: Secondary | ICD-10-CM | POA: Diagnosis not present

## 2017-05-15 DIAGNOSIS — R2689 Other abnormalities of gait and mobility: Secondary | ICD-10-CM | POA: Diagnosis not present

## 2017-05-15 DIAGNOSIS — Z96651 Presence of right artificial knee joint: Secondary | ICD-10-CM | POA: Diagnosis not present

## 2017-05-29 DIAGNOSIS — Z471 Aftercare following joint replacement surgery: Secondary | ICD-10-CM | POA: Diagnosis not present

## 2017-05-29 DIAGNOSIS — Z96651 Presence of right artificial knee joint: Secondary | ICD-10-CM | POA: Diagnosis not present

## 2017-06-14 DIAGNOSIS — M5137 Other intervertebral disc degeneration, lumbosacral region: Secondary | ICD-10-CM | POA: Diagnosis not present

## 2017-07-10 DIAGNOSIS — Z471 Aftercare following joint replacement surgery: Secondary | ICD-10-CM | POA: Diagnosis not present

## 2017-07-10 DIAGNOSIS — M1711 Unilateral primary osteoarthritis, right knee: Secondary | ICD-10-CM | POA: Diagnosis not present

## 2017-07-10 DIAGNOSIS — Z96651 Presence of right artificial knee joint: Secondary | ICD-10-CM | POA: Diagnosis not present

## 2017-08-28 DIAGNOSIS — L820 Inflamed seborrheic keratosis: Secondary | ICD-10-CM | POA: Diagnosis not present

## 2017-11-20 DIAGNOSIS — M25551 Pain in right hip: Secondary | ICD-10-CM | POA: Diagnosis not present

## 2017-11-26 DIAGNOSIS — H524 Presbyopia: Secondary | ICD-10-CM | POA: Diagnosis not present

## 2017-11-26 DIAGNOSIS — H26493 Other secondary cataract, bilateral: Secondary | ICD-10-CM | POA: Diagnosis not present

## 2018-03-12 DIAGNOSIS — R7303 Prediabetes: Secondary | ICD-10-CM | POA: Diagnosis not present

## 2018-03-12 DIAGNOSIS — E782 Mixed hyperlipidemia: Secondary | ICD-10-CM | POA: Diagnosis not present

## 2018-03-12 DIAGNOSIS — I10 Essential (primary) hypertension: Secondary | ICD-10-CM | POA: Diagnosis not present

## 2018-03-15 DIAGNOSIS — E782 Mixed hyperlipidemia: Secondary | ICD-10-CM | POA: Diagnosis not present

## 2018-03-15 DIAGNOSIS — I10 Essential (primary) hypertension: Secondary | ICD-10-CM | POA: Diagnosis not present

## 2018-03-15 DIAGNOSIS — R7303 Prediabetes: Secondary | ICD-10-CM | POA: Diagnosis not present

## 2018-04-01 DIAGNOSIS — Z1231 Encounter for screening mammogram for malignant neoplasm of breast: Secondary | ICD-10-CM | POA: Diagnosis not present

## 2018-04-01 DIAGNOSIS — Z124 Encounter for screening for malignant neoplasm of cervix: Secondary | ICD-10-CM | POA: Diagnosis not present

## 2018-04-01 DIAGNOSIS — Z6832 Body mass index (BMI) 32.0-32.9, adult: Secondary | ICD-10-CM | POA: Diagnosis not present

## 2018-04-03 ENCOUNTER — Other Ambulatory Visit: Payer: Self-pay | Admitting: Obstetrics & Gynecology

## 2018-04-03 DIAGNOSIS — R928 Other abnormal and inconclusive findings on diagnostic imaging of breast: Secondary | ICD-10-CM

## 2018-04-05 ENCOUNTER — Ambulatory Visit
Admission: RE | Admit: 2018-04-05 | Discharge: 2018-04-05 | Disposition: A | Discharge status: Home / Self Care | Payer: Medicare Other | Source: Ambulatory Visit | Attending: Obstetrics & Gynecology | Admitting: Obstetrics & Gynecology

## 2018-04-05 ENCOUNTER — Other Ambulatory Visit: Payer: Self-pay | Admitting: Obstetrics & Gynecology

## 2018-04-05 DIAGNOSIS — N6002 Solitary cyst of left breast: Secondary | ICD-10-CM

## 2018-04-05 DIAGNOSIS — R928 Other abnormal and inconclusive findings on diagnostic imaging of breast: Secondary | ICD-10-CM

## 2018-04-05 DIAGNOSIS — N6321 Unspecified lump in the left breast, upper outer quadrant: Secondary | ICD-10-CM | POA: Diagnosis not present

## 2018-04-16 DIAGNOSIS — Z23 Encounter for immunization: Secondary | ICD-10-CM | POA: Diagnosis not present

## 2018-06-25 ENCOUNTER — Other Ambulatory Visit: Payer: Self-pay

## 2018-07-25 DIAGNOSIS — M19041 Primary osteoarthritis, right hand: Secondary | ICD-10-CM | POA: Insufficient documentation

## 2018-07-25 DIAGNOSIS — M13841 Other specified arthritis, right hand: Secondary | ICD-10-CM | POA: Diagnosis not present

## 2018-08-07 DIAGNOSIS — I700 Atherosclerosis of aorta: Secondary | ICD-10-CM

## 2018-08-07 HISTORY — DX: Atherosclerosis of aorta: I70.0

## 2018-10-11 ENCOUNTER — Ambulatory Visit
Admission: RE | Admit: 2018-10-11 | Discharge: 2018-10-11 | Disposition: A | Discharge status: Home / Self Care | Payer: Medicare Other | Source: Ambulatory Visit | Attending: Obstetrics & Gynecology | Admitting: Obstetrics & Gynecology

## 2018-10-11 ENCOUNTER — Other Ambulatory Visit: Payer: Self-pay | Admitting: Obstetrics & Gynecology

## 2018-10-11 ENCOUNTER — Other Ambulatory Visit: Payer: Medicare Other

## 2018-10-11 DIAGNOSIS — N6002 Solitary cyst of left breast: Secondary | ICD-10-CM

## 2018-10-11 DIAGNOSIS — N6321 Unspecified lump in the left breast, upper outer quadrant: Secondary | ICD-10-CM | POA: Diagnosis not present

## 2018-10-11 DIAGNOSIS — R922 Inconclusive mammogram: Secondary | ICD-10-CM | POA: Diagnosis not present

## 2018-12-04 DIAGNOSIS — Z0001 Encounter for general adult medical examination with abnormal findings: Secondary | ICD-10-CM | POA: Diagnosis not present

## 2018-12-04 DIAGNOSIS — Z1159 Encounter for screening for other viral diseases: Secondary | ICD-10-CM | POA: Diagnosis not present

## 2018-12-04 DIAGNOSIS — R7303 Prediabetes: Secondary | ICD-10-CM | POA: Diagnosis not present

## 2018-12-04 DIAGNOSIS — E782 Mixed hyperlipidemia: Secondary | ICD-10-CM | POA: Diagnosis not present

## 2018-12-04 DIAGNOSIS — I10 Essential (primary) hypertension: Secondary | ICD-10-CM | POA: Diagnosis not present

## 2018-12-05 DIAGNOSIS — R7301 Impaired fasting glucose: Secondary | ICD-10-CM | POA: Diagnosis not present

## 2018-12-05 DIAGNOSIS — E782 Mixed hyperlipidemia: Secondary | ICD-10-CM | POA: Diagnosis not present

## 2018-12-05 DIAGNOSIS — I10 Essential (primary) hypertension: Secondary | ICD-10-CM | POA: Diagnosis not present

## 2019-01-02 DIAGNOSIS — I10 Essential (primary) hypertension: Secondary | ICD-10-CM | POA: Diagnosis not present

## 2019-02-10 DIAGNOSIS — H26491 Other secondary cataract, right eye: Secondary | ICD-10-CM | POA: Diagnosis not present

## 2019-02-17 DIAGNOSIS — M25551 Pain in right hip: Secondary | ICD-10-CM | POA: Diagnosis not present

## 2019-03-11 DIAGNOSIS — M25511 Pain in right shoulder: Secondary | ICD-10-CM | POA: Diagnosis not present

## 2019-03-12 DIAGNOSIS — I10 Essential (primary) hypertension: Secondary | ICD-10-CM | POA: Diagnosis not present

## 2019-03-12 DIAGNOSIS — Z6831 Body mass index (BMI) 31.0-31.9, adult: Secondary | ICD-10-CM | POA: Diagnosis not present

## 2019-03-12 DIAGNOSIS — E1169 Type 2 diabetes mellitus with other specified complication: Secondary | ICD-10-CM | POA: Diagnosis not present

## 2019-03-12 DIAGNOSIS — E668 Other obesity: Secondary | ICD-10-CM | POA: Diagnosis not present

## 2019-03-12 DIAGNOSIS — E782 Mixed hyperlipidemia: Secondary | ICD-10-CM | POA: Diagnosis not present

## 2019-03-14 ENCOUNTER — Other Ambulatory Visit: Payer: Self-pay | Admitting: Orthopedic Surgery

## 2019-03-14 DIAGNOSIS — M25511 Pain in right shoulder: Secondary | ICD-10-CM

## 2019-03-18 ENCOUNTER — Ambulatory Visit
Admission: RE | Admit: 2019-03-18 | Discharge: 2019-03-18 | Disposition: A | Discharge status: Home / Self Care | Payer: Medicare Other | Source: Ambulatory Visit | Attending: Orthopedic Surgery | Admitting: Orthopedic Surgery

## 2019-03-18 DIAGNOSIS — M25511 Pain in right shoulder: Secondary | ICD-10-CM

## 2019-03-18 DIAGNOSIS — R079 Chest pain, unspecified: Secondary | ICD-10-CM | POA: Diagnosis not present

## 2019-03-18 MED ORDER — IOPAMIDOL (ISOVUE-300) INJECTION 61%
75.0000 mL | Freq: Once | INTRAVENOUS | Status: AC | PRN
  Administered 2019-03-18: 65 mL via INTRAVENOUS

## 2019-03-26 DIAGNOSIS — R944 Abnormal results of kidney function studies: Secondary | ICD-10-CM | POA: Diagnosis not present

## 2019-04-10 DIAGNOSIS — Z23 Encounter for immunization: Secondary | ICD-10-CM | POA: Diagnosis not present

## 2019-04-10 DIAGNOSIS — R944 Abnormal results of kidney function studies: Secondary | ICD-10-CM | POA: Diagnosis not present

## 2019-04-10 DIAGNOSIS — I10 Essential (primary) hypertension: Secondary | ICD-10-CM | POA: Diagnosis not present

## 2019-04-15 ENCOUNTER — Other Ambulatory Visit: Payer: Medicare Other

## 2019-05-13 DIAGNOSIS — I129 Hypertensive chronic kidney disease with stage 1 through stage 4 chronic kidney disease, or unspecified chronic kidney disease: Secondary | ICD-10-CM | POA: Diagnosis not present

## 2019-05-27 DIAGNOSIS — H26491 Other secondary cataract, right eye: Secondary | ICD-10-CM | POA: Diagnosis not present

## 2019-05-27 DIAGNOSIS — H5319 Other subjective visual disturbances: Secondary | ICD-10-CM | POA: Diagnosis not present

## 2019-06-03 DIAGNOSIS — R350 Frequency of micturition: Secondary | ICD-10-CM | POA: Diagnosis not present

## 2019-06-03 DIAGNOSIS — L900 Lichen sclerosus et atrophicus: Secondary | ICD-10-CM | POA: Diagnosis not present

## 2019-06-04 DIAGNOSIS — H5340 Unspecified visual field defects: Secondary | ICD-10-CM | POA: Diagnosis not present

## 2019-06-16 DIAGNOSIS — Z6831 Body mass index (BMI) 31.0-31.9, adult: Secondary | ICD-10-CM | POA: Diagnosis not present

## 2019-06-16 DIAGNOSIS — G458 Other transient cerebral ischemic attacks and related syndromes: Secondary | ICD-10-CM | POA: Diagnosis not present

## 2019-06-16 DIAGNOSIS — E782 Mixed hyperlipidemia: Secondary | ICD-10-CM | POA: Diagnosis not present

## 2019-06-16 DIAGNOSIS — E668 Other obesity: Secondary | ICD-10-CM | POA: Diagnosis not present

## 2019-06-16 DIAGNOSIS — I10 Essential (primary) hypertension: Secondary | ICD-10-CM | POA: Diagnosis not present

## 2019-06-16 DIAGNOSIS — E1169 Type 2 diabetes mellitus with other specified complication: Secondary | ICD-10-CM | POA: Diagnosis not present

## 2019-06-19 DIAGNOSIS — G459 Transient cerebral ischemic attack, unspecified: Secondary | ICD-10-CM | POA: Diagnosis not present

## 2019-06-19 DIAGNOSIS — G458 Other transient cerebral ischemic attacks and related syndromes: Secondary | ICD-10-CM

## 2019-06-27 DIAGNOSIS — G458 Other transient cerebral ischemic attacks and related syndromes: Secondary | ICD-10-CM | POA: Diagnosis not present

## 2019-06-27 DIAGNOSIS — G459 Transient cerebral ischemic attack, unspecified: Secondary | ICD-10-CM | POA: Diagnosis not present

## 2019-09-29 ENCOUNTER — Other Ambulatory Visit: Payer: Self-pay

## 2019-09-29 MED ORDER — FENOFIBRATE 150 MG PO CAPS: 1.0000 | ORAL_CAPSULE | Freq: Every day | ORAL | 0 refills | Status: DC

## 2019-09-30 ENCOUNTER — Other Ambulatory Visit: Payer: Self-pay | Admitting: Family Medicine

## 2019-10-02 ENCOUNTER — Other Ambulatory Visit: Payer: Self-pay | Admitting: Physician Assistant

## 2019-10-02 ENCOUNTER — Other Ambulatory Visit: Payer: Self-pay

## 2019-10-02 MED ORDER — FENOFIBRATE 160 MG PO TABS: 160.0000 mg | ORAL_TABLET | Freq: Every day | ORAL | 0 refills | Status: DC

## 2019-10-02 MED ORDER — FENOFIBRATE 150 MG PO CAPS: 150.0000 mg | ORAL_CAPSULE | Freq: Every day | ORAL | 1 refills | Status: DC

## 2019-10-14 ENCOUNTER — Other Ambulatory Visit: Payer: Self-pay

## 2019-10-14 ENCOUNTER — Ambulatory Visit (INDEPENDENT_AMBULATORY_CARE_PROVIDER_SITE_OTHER): Payer: Medicare Other | Admitting: Family Medicine

## 2019-10-14 ENCOUNTER — Encounter: Payer: Self-pay | Admitting: Family Medicine

## 2019-10-14 VITALS — BP 128/64 | HR 84 | Temp 97.0°F | Ht 62.0 in | Wt 173.4 lb

## 2019-10-14 DIAGNOSIS — E785 Hyperlipidemia, unspecified: Secondary | ICD-10-CM

## 2019-10-14 DIAGNOSIS — E782 Mixed hyperlipidemia: Secondary | ICD-10-CM | POA: Insufficient documentation

## 2019-10-14 DIAGNOSIS — R0602 Shortness of breath: Secondary | ICD-10-CM

## 2019-10-14 DIAGNOSIS — E1169 Type 2 diabetes mellitus with other specified complication: Secondary | ICD-10-CM | POA: Insufficient documentation

## 2019-10-14 DIAGNOSIS — I10 Essential (primary) hypertension: Secondary | ICD-10-CM | POA: Diagnosis not present

## 2019-10-14 NOTE — Patient Instructions (Addendum)
Essential hypertension, benign Well controlled.  No changes to medicines.  Continue to work on eating a healthy diet and exercise.  Labs drawn today.   Shortness of breath Order Chest Xray.  Recommend pulmonary referral this summer. Patient to call back when she would like to go.   Mixed hyperlipidemia Well controlled.  No changes to medicines.  Continue to work on eating a healthy diet and exercise.  Labs drawn today.   Dyslipidemia associated with type 2 diabetes mellitus ([place]) Well controlled.  No changes to medicines.  Continue to work on eating a healthy diet and exercise.  Labs drawn today.

## 2019-10-14 NOTE — Assessment & Plan Note (Signed)
Well controlled.  ?No changes to medicines.  ?Continue to work on eating a healthy diet and exercise.  ?Labs drawn today.  ?

## 2019-10-14 NOTE — Progress Notes (Signed)
Subjective:  Patient ID: Denise [name], female    DOB: Jul 07, 1946  Age: 74 y.o. MRN: [id number]  Chief Complaint  Patient presents with   Hypertension   Hyperlipidemia   Diabetes   Is a 74 year old white female who presents for follow-up of hypertension, diabetes, and hyperlipidemia.  Her diabetes has been diagnosed within the last year.  The patient has been trying to eat healthier and decreasing her sweets.  She does exercise some although this is limited due to some shortness of breath.  She has had shortness of breath issues for the last 3+ years however it has been worsening over the last 6 months.  She is thinks this may be due to having to wear her mask but she is not sure.  She has never smoked.  She is compliant with taking her medications.  She is tolerating them well.  Denies coughing or chest pain.  She is not checking her sugars currently because her diabetes is well controlled.  Her last A1c was 6.5.  Patient has been tried on numerous inhalers over the past 3 years none of which seem to improve her symptoms at all.   Hypertension Associated symptoms include shortness of breath. Pertinent negatives include no chest pain or headaches.  Hyperlipidemia Associated symptoms include shortness of breath. Pertinent negatives include no chest pain or myalgias.  Diabetes Pertinent negatives for hypoglycemia include no dizziness, headaches or nervousness/anxiousness. Pertinent negatives for diabetes include no chest pain, no fatigue, no polydipsia, no polyphagia and no polyuria.     Social History   Socioeconomic History   Marital status: Married    Spouse name: Not on file   Number of children: Not on file   Years of education: Not on file   Highest education level: Not on file  Occupational History   Occupation: Self Employed  Tobacco Use   Smoking status: Never Smoker   Smokeless tobacco: Never Used  Substance and Sexual Activity   Alcohol use: No   Drug use: No    Sexual activity: Yes  Other Topics Concern   Not on file  Social History Narrative   Not on file   Social Determinants of Health   Financial Resource Strain:    Difficulty of Paying Living Expenses:   Food Insecurity:    Worried About Charity fundraiser in the Last Year:    Arboriculturist in the Last Year:   Transportation Needs:    Film/video editor (Medical):    Lack of Transportation (Non-Medical):   Physical Activity:    Days of Exercise per Week:    Minutes of Exercise per Session:   Stress:    Feeling of Stress :   Social Connections:    Frequency of Communication with Friends and Family:    Frequency of Social Gatherings with Friends and Family:    Attends Religious Services:    Active Member of Clubs or Organizations:    Attends Archivist Meetings:    Marital Status:    Past Medical History:  Diagnosis Date   Arthritis    Constipation    HTN (hypertension) 06/15/2015   Pneumonia    Family History  Problem Relation Age of Onset   Liver disease Father    Cirrhosis Father    Bone cancer Mother    Breast cancer Other        aunts   Colon cancer Neg Hx     Review of Systems  Constitutional:  Negative for chills, fatigue and fever.  HENT: Negative for congestion, ear pain and sore throat.   Respiratory: Positive for shortness of breath. Negative for cough.        DOE.  Dyspnea with extended speaking.  Cardiovascular: Negative for chest pain.  Gastrointestinal: Negative for abdominal pain, constipation, diarrhea, nausea and vomiting.  Endocrine: Negative for polydipsia, polyphagia and polyuria.  Genitourinary: Negative for dysuria and urgency.  Musculoskeletal: Negative for arthralgias and myalgias.  Neurological: Negative for dizziness and headaches.  Psychiatric/Behavioral: Negative for dysphoric mood. The patient is not nervous/anxious.      Objective:  BP 128/64   Pulse 84   Temp (!) 97 F (36.1 C)   Ht 5\' 2"  (1.575 m)   Wt 173  lb 6.4 oz (78.7 kg)   BMI 31.72 kg/m   BP/Weight 10/14/2019 03/30/2017 123XX123  Systolic BP 0000000 123XX123 123XX123  Diastolic BP 64 69 81  Wt. (Lbs) 173.4 - -  BMI 31.72 - -    Physical Exam Constitutional:      Appearance: Normal appearance. She is normal weight.  Cardiovascular:     Rate and Rhythm: Normal rate and regular rhythm.  Pulmonary:     Effort: Pulmonary effort is normal.     Breath sounds: Normal breath sounds.  Abdominal:     General: Bowel sounds are normal.     Tenderness: There is no abdominal tenderness.  Feet:     Right foot:     Skin integrity: Skin integrity normal.     Toenail Condition: Right toenails are normal.     Left foot:     Skin integrity: Skin integrity normal.     Toenail Condition: Left toenails are normal.  Neurological:     Mental Status: She is alert.  Psychiatric:        Mood and Affect: Mood normal.        Behavior: Behavior normal.    Diabetic Foot Exam - Simple   Simple Foot Form Visual Inspection No deformities, no ulcerations, no other skin breakdown bilaterally: Yes Sensation Testing Intact to touch and monofilament testing bilaterally: Yes Pulse Check Posterior Tibialis and Dorsalis pulse intact bilaterally: Yes Comments     Lab Results  Component Value Date   WBC 7.6 10/14/2019   HGB 11.7 10/14/2019   HCT 36.0 10/14/2019   PLT 338 10/14/2019   GLUCOSE 113 (H) 10/14/2019   CHOL 159 10/14/2019   TRIG 136 10/14/2019   HDL 44 10/14/2019   LDLCALC 91 10/14/2019   ALT 12 10/14/2019   AST 20 10/14/2019   NA 140 10/14/2019   K 4.9 10/14/2019   CL 104 10/14/2019   CREATININE 1.21 (H) 10/14/2019   BUN 20 10/14/2019   CO2 23 10/14/2019   TSH 2.020 10/14/2019   INR 0.94 03/20/2017   HGBA1C 6.7 (H) 10/14/2019      Assessment & Plan:  Essential hypertension, benign Well controlled.  No changes to medicines.  Continue to work on eating a healthy diet and exercise.  Labs drawn today.   Shortness of breath Order Chest  Xray.  Recommend pulmonary referral this summer. Patient to call back when she would like to go.   Mixed hyperlipidemia Well controlled.  No changes to medicines.  Continue to work on eating a healthy diet and exercise.  Labs drawn today.   Dyslipidemia associated with type 2 diabetes mellitus ([place]) Well controlled.  No changes to medicines.  Continue to work on eating a healthy diet and  exercise.  Labs drawn today.   Essential hypertension, benign Well controlled.  No changes to medicines.  Continue to work on eating a healthy diet and exercise.  Labs drawn today.   Shortness of breath Order Chest Xray.  Recommend pulmonary referral this summer. Patient to call back when she would like to go.   Mixed hyperlipidemia Well controlled.  No changes to medicines.  Continue to work on eating a healthy diet and exercise.  Labs drawn today.   Dyslipidemia associated with type 2 diabetes mellitus ([place]) Well controlled.  No changes to medicines.  Continue to work on eating a healthy diet and exercise.  Labs drawn today.         Follow-up: Return in about 6 months (around 04/15/2020).  AVS was given to patient prior to departure.  Rochel Brome [facility] [phone]

## 2019-10-14 NOTE — Assessment & Plan Note (Signed)
Order Chest Xray.  Recommend pulmonary referral this summer. Patient to call back when she would like to go.

## 2019-10-15 LAB — COMPREHENSIVE METABOLIC PANEL
ALT: 12 IU/L (ref 0–32)
AST: 20 IU/L (ref 0–40)
Albumin/Globulin Ratio: 2 (ref 1.2–2.2)
Albumin: 4.5 g/dL (ref 3.7–4.7)
Alkaline Phosphatase: 74 IU/L (ref 39–117)
BUN/Creatinine Ratio: 17 (ref 12–28)
BUN: 20 mg/dL (ref 8–27)
Bilirubin Total: 0.3 mg/dL (ref 0.0–1.2)
CO2: 23 mmol/L (ref 20–29)
Calcium: 9.7 mg/dL (ref 8.7–10.3)
Chloride: 104 mmol/L (ref 96–106)
Creatinine, Ser: 1.21 mg/dL — ABNORMAL HIGH (ref 0.57–1.00)
GFR calc Af Amer: 51 mL/min/{1.73_m2} — ABNORMAL LOW (ref >59)
GFR calc non Af Amer: 44 mL/min/{1.73_m2} — ABNORMAL LOW (ref >59)
Globulin, Total: 2.3 g/dL (ref 1.5–4.5)
Glucose: 113 mg/dL — ABNORMAL HIGH (ref 65–99)
Potassium: 4.9 mmol/L (ref 3.5–5.2)
Sodium: 140 mmol/L (ref 134–144)
Total Protein: 6.8 g/dL (ref 6.0–8.5)

## 2019-10-15 LAB — CBC WITH DIFFERENTIAL/PLATELET
Basophils Absolute: 0.1 10*3/uL (ref 0.0–0.2)
Basos: 1 %
EOS (ABSOLUTE): 0.3 10*3/uL (ref 0.0–0.4)
Eos: 4 %
Hematocrit: 36 % (ref 34.0–46.6)
Hemoglobin: 11.7 g/dL (ref 11.1–15.9)
Immature Grans (Abs): 0 10*3/uL (ref 0.0–0.1)
Immature Granulocytes: 1 %
Lymphocytes Absolute: 1.9 10*3/uL (ref 0.7–3.1)
Lymphs: 26 %
MCH: 28.3 pg (ref 26.6–33.0)
MCHC: 32.5 g/dL (ref 31.5–35.7)
MCV: 87 fL (ref 79–97)
Monocytes Absolute: 0.5 10*3/uL (ref 0.1–0.9)
Monocytes: 6 %
Neutrophils Absolute: 4.7 10*3/uL (ref 1.4–7.0)
Neutrophils: 62 %
Platelets: 338 10*3/uL (ref 150–450)
RBC: 4.13 x10E6/uL (ref 3.77–5.28)
RDW: 12.3 % (ref 11.7–15.4)
WBC: 7.6 10*3/uL (ref 3.4–10.8)

## 2019-10-15 LAB — CARDIOVASCULAR RISK ASSESSMENT

## 2019-10-15 LAB — LIPID PANEL
Chol/HDL Ratio: 3.6 ratio (ref 0.0–4.4)
Cholesterol, Total: 159 mg/dL (ref 100–199)
HDL: 44 mg/dL (ref >39)
LDL Chol Calc (NIH): 91 mg/dL (ref 0–99)
Triglycerides: 136 mg/dL (ref 0–149)
VLDL Cholesterol Cal: 24 mg/dL (ref 5–40)

## 2019-10-15 LAB — HEMOGLOBIN A1C
Est. average glucose Bld gHb Est-mCnc: 146 mg/dL
Hgb A1c MFr Bld: 6.7 % — ABNORMAL HIGH (ref 4.8–5.6)

## 2019-10-15 LAB — TSH: TSH: 2.02 u[IU]/mL (ref 0.450–4.500)

## 2019-10-30 ENCOUNTER — Other Ambulatory Visit: Payer: Self-pay

## 2019-10-30 DIAGNOSIS — R0602 Shortness of breath: Secondary | ICD-10-CM

## 2019-11-02 ENCOUNTER — Encounter: Payer: Self-pay | Admitting: Family Medicine

## 2019-12-19 ENCOUNTER — Other Ambulatory Visit: Payer: Self-pay | Admitting: Family Medicine

## 2019-12-29 ENCOUNTER — Other Ambulatory Visit: Payer: Self-pay | Admitting: Family Medicine

## 2020-02-23 ENCOUNTER — Other Ambulatory Visit: Payer: Self-pay | Admitting: Obstetrics & Gynecology

## 2020-02-23 DIAGNOSIS — N6320 Unspecified lump in the left breast, unspecified quadrant: Secondary | ICD-10-CM

## 2020-03-09 DIAGNOSIS — M25511 Pain in right shoulder: Secondary | ICD-10-CM | POA: Diagnosis not present

## 2020-03-10 ENCOUNTER — Other Ambulatory Visit: Payer: Self-pay | Admitting: Obstetrics & Gynecology

## 2020-03-10 ENCOUNTER — Other Ambulatory Visit: Payer: Self-pay

## 2020-03-10 ENCOUNTER — Ambulatory Visit
Admission: RE | Admit: 2020-03-10 | Discharge: 2020-03-10 | Disposition: A | Discharge status: Home / Self Care | Payer: Medicare Other | Source: Ambulatory Visit | Attending: Obstetrics & Gynecology | Admitting: Obstetrics & Gynecology

## 2020-03-10 DIAGNOSIS — N6320 Unspecified lump in the left breast, unspecified quadrant: Secondary | ICD-10-CM

## 2020-03-10 DIAGNOSIS — N6321 Unspecified lump in the left breast, upper outer quadrant: Secondary | ICD-10-CM | POA: Diagnosis not present

## 2020-03-10 DIAGNOSIS — R922 Inconclusive mammogram: Secondary | ICD-10-CM | POA: Diagnosis not present

## 2020-03-10 DIAGNOSIS — N6323 Unspecified lump in the left breast, lower outer quadrant: Secondary | ICD-10-CM | POA: Diagnosis not present

## 2020-03-30 ENCOUNTER — Other Ambulatory Visit: Payer: Self-pay | Admitting: Family Medicine

## 2020-04-05 DIAGNOSIS — H524 Presbyopia: Secondary | ICD-10-CM | POA: Diagnosis not present

## 2020-04-05 DIAGNOSIS — H26491 Other secondary cataract, right eye: Secondary | ICD-10-CM | POA: Diagnosis not present

## 2020-04-15 ENCOUNTER — Other Ambulatory Visit: Payer: Self-pay

## 2020-04-15 ENCOUNTER — Encounter: Payer: Self-pay | Admitting: Family Medicine

## 2020-04-15 ENCOUNTER — Ambulatory Visit (INDEPENDENT_AMBULATORY_CARE_PROVIDER_SITE_OTHER): Payer: Medicare Other | Admitting: Family Medicine

## 2020-04-15 VITALS — BP 144/64 | HR 84 | Temp 97.3°F | Resp 16 | Ht 62.0 in | Wt 168.2 lb

## 2020-04-15 DIAGNOSIS — I10 Essential (primary) hypertension: Secondary | ICD-10-CM

## 2020-04-15 DIAGNOSIS — Z23 Encounter for immunization: Secondary | ICD-10-CM

## 2020-04-15 DIAGNOSIS — E1169 Type 2 diabetes mellitus with other specified complication: Secondary | ICD-10-CM | POA: Diagnosis not present

## 2020-04-15 DIAGNOSIS — E785 Hyperlipidemia, unspecified: Secondary | ICD-10-CM

## 2020-04-15 DIAGNOSIS — E782 Mixed hyperlipidemia: Secondary | ICD-10-CM | POA: Diagnosis not present

## 2020-04-15 MED ORDER — CELECOXIB 200 MG PO CAPS: 200.0000 mg | ORAL_CAPSULE | Freq: Every day | ORAL | 0 refills | Status: DC

## 2020-04-15 MED ORDER — AMLODIPINE BESYLATE 5 MG PO TABS: 5.0000 mg | ORAL_TABLET | Freq: Every day | ORAL | 1 refills | Status: DC

## 2020-04-15 NOTE — Progress Notes (Signed)
Subjective:  Patient ID: Denise [name], female    DOB: [birth year]  Age: 74 y.o. MRN: [id number]  Chief Complaint  Patient presents with  . Diabetes  . Hyperlipidemia   Is a 74 year old white female who presents for follow-up of hypertension, diabetes, and hyperlipidemia.   Diabetes: She was diagnosed within the last year.  The patient has been trying to eat healthier and decreasing her sweets.  She does exercise some although this is limited due to some shortness of breath.  She has had shortness of breath issues for the last 3+ years however it has been worsening over the last 6 months.  She is not checking her sugars currently because her diabetes is well controlled.  Her last A1c was 6.5.    Hypertension: Taking amlodipine and telmisartan. Denies chest pain.  Hyperlipidemia:  Taking fenofibrate. Low fat diet.  Social History   Socioeconomic History  . Marital status: Married    Spouse name: Not on file  . Number of children: Not on file  . Years of education: Not on file  . Highest education level: Not on file  Occupational History  . Occupation: Self Employed  Tobacco Use  . Smoking status: Never Smoker  . Smokeless tobacco: Never Used  Vaping Use  . Vaping Use: Never used  Substance and Sexual Activity  . Alcohol use: No  . Drug use: No  . Sexual activity: Yes  Other Topics Concern  . Not on file  Social History Narrative  . Not on file   Social Determinants of Health   Financial Resource Strain:   . Difficulty of Paying Living Expenses: Not on file  Food Insecurity:   . Worried About Charity fundraiser in the Last Year: Not on file  . Ran Out of Food in the Last Year: Not on file  Transportation Needs:   . Lack of Transportation (Medical): Not on file  . Lack of Transportation (Non-Medical): Not on file  Physical Activity:   . Days of Exercise per Week: Not on file  . Minutes of Exercise per Session: Not on file  Stress:   . Feeling of Stress : Not  on file  Social Connections:   . Frequency of Communication with Friends and Family: Not on file  . Frequency of Social Gatherings with Friends and Family: Not on file  . Attends Religious Services: Not on file  . Active Member of Clubs or Organizations: Not on file  . Attends Archivist Meetings: Not on file  . Marital Status: Not on file   Past Medical History:  Diagnosis Date  . Arthritis   . Constipation   . HTN (hypertension) 06/15/2015  . Pneumonia    Family History  Problem Relation Age of Onset  . Liver disease Father   . Cirrhosis Father   . Bone cancer Mother   . Breast cancer Other        aunts  . Colon cancer Neg Hx     Review of Systems  Constitutional: Positive for fatigue. Negative for chills and fever.  HENT: Negative for congestion, ear pain and sore throat.   Respiratory: Positive for shortness of breath. Negative for cough.        DOE.  Dyspnea with extended speaking.  Cardiovascular: Negative for chest pain.  Gastrointestinal: Negative for abdominal pain, constipation, diarrhea, nausea and vomiting.  Endocrine: Negative for polydipsia, polyphagia and polyuria.  Genitourinary: Negative for dysuria and urgency.  Musculoskeletal: Positive for arthralgias,  back pain and myalgias.  Neurological: Negative for dizziness and headaches.  Psychiatric/Behavioral: Negative for dysphoric mood. The patient is not nervous/anxious.      Objective:  BP (!) 144/64   Pulse 84   Temp (!) 97.3 F (36.3 C)   Resp 16   Ht 5\' 2"  (1.575 m)   Wt 168 lb 3.2 oz (76.3 kg)   BMI 30.76 kg/m   BP/Weight 04/15/2020 10/14/2019 1/60/1093  Systolic BP 235 573 220  Diastolic BP 64 64 69  Wt. (Lbs) 168.2 173.4 -  BMI 30.76 31.72 -    Physical Exam Constitutional:      Appearance: Normal appearance. She is normal weight.  Cardiovascular:     Rate and Rhythm: Normal rate and regular rhythm.  Pulmonary:     Effort: Pulmonary effort is normal.     Breath sounds:  Normal breath sounds.  Abdominal:     General: Bowel sounds are normal.     Tenderness: There is abdominal tenderness.  Feet:     Right foot:     Skin integrity: Skin integrity normal.     Toenail Condition: Right toenails are normal.     Left foot:     Skin integrity: Skin integrity normal.     Toenail Condition: Left toenails are normal.  Neurological:     Mental Status: She is alert.  Psychiatric:        Mood and Affect: Mood normal.        Behavior: Behavior normal.    Diabetic Foot Exam - Simple   Simple Foot Form Diabetic Foot exam was performed with the following findings: Yes 04/15/2020  9:00 AM  Visual Inspection No deformities, no ulcerations, no other skin breakdown bilaterally: Yes Sensation Testing Intact to touch and monofilament testing bilaterally: Yes Pulse Check Posterior Tibialis and Dorsalis pulse intact bilaterally: Yes Comments     Lab Results  Component Value Date   WBC 6.3 04/15/2020   HGB 12.1 04/15/2020   HCT 37.9 04/15/2020   PLT 303 04/15/2020   GLUCOSE 118 (H) 04/15/2020   CHOL 193 04/15/2020   TRIG 223 (H) 04/15/2020   HDL 43 04/15/2020   LDLCALC 111 (H) 04/15/2020   ALT 10 04/15/2020   AST 16 04/15/2020   NA 141 04/15/2020   K 4.3 04/15/2020   CL 106 04/15/2020   CREATININE 1.18 (H) 04/15/2020   BUN 19 04/15/2020   CO2 21 04/15/2020   TSH 2.020 10/14/2019   INR 0.94 03/20/2017   HGBA1C 7.0 (H) 04/15/2020      Assessment & Plan:  1. Essential hypertension, benign Well controlled.  No changes to medicines.  Continue to work on eating a healthy diet and exercise.  Labs drawn today.  - CBC with Differential/Platelet - Comprehensive metabolic panel  2. Dyslipidemia associated with type 2 diabetes mellitus ([facility]) Control: not at goal Recommend check sugars fasting daily. Recommend check feet daily. Recommend annual eye exams. Medicines: hold fenofibrate to see if causing muscle pain. Continue to work on eating a healthy  diet and exercise.  Labs drawn today.   - Hemoglobin A1c  3. Mixed hyperlipidemia Worsened. Hold gemfibrizol due to muscle pain. See if improves. Continue to work on eating a healthy diet and exercise.  Labs drawn today.  - Lipid panel  4. Need for immunization against influenza - Flu Vaccine QUAD High Dose(Fluad)  Follow-up: Return in about 3 months (around 07/15/2020) for fasting.  AVS was given to patient prior to departure.  Rochel Brome [facility] [phone]

## 2020-04-15 NOTE — Patient Instructions (Signed)
Hold fenofibrate for the next three months

## 2020-04-16 LAB — LIPID PANEL
Chol/HDL Ratio: 4.5 ratio — ABNORMAL HIGH (ref 0.0–4.4)
Cholesterol, Total: 193 mg/dL (ref 100–199)
HDL: 43 mg/dL (ref >39)
LDL Chol Calc (NIH): 111 mg/dL — ABNORMAL HIGH (ref 0–99)
Triglycerides: 223 mg/dL — ABNORMAL HIGH (ref 0–149)
VLDL Cholesterol Cal: 39 mg/dL (ref 5–40)

## 2020-04-16 LAB — COMPREHENSIVE METABOLIC PANEL
ALT: 10 IU/L (ref 0–32)
AST: 16 IU/L (ref 0–40)
Albumin/Globulin Ratio: 2.1 (ref 1.2–2.2)
Albumin: 4.5 g/dL (ref 3.7–4.7)
Alkaline Phosphatase: 74 IU/L (ref 48–121)
BUN/Creatinine Ratio: 16 (ref 12–28)
BUN: 19 mg/dL (ref 8–27)
Bilirubin Total: 0.4 mg/dL (ref 0.0–1.2)
CO2: 21 mmol/L (ref 20–29)
Calcium: 9.8 mg/dL (ref 8.7–10.3)
Chloride: 106 mmol/L (ref 96–106)
Creatinine, Ser: 1.18 mg/dL — ABNORMAL HIGH (ref 0.57–1.00)
GFR calc Af Amer: 53 mL/min/{1.73_m2} — ABNORMAL LOW (ref >59)
GFR calc non Af Amer: 46 mL/min/{1.73_m2} — ABNORMAL LOW (ref >59)
Globulin, Total: 2.1 g/dL (ref 1.5–4.5)
Glucose: 118 mg/dL — ABNORMAL HIGH (ref 65–99)
Potassium: 4.3 mmol/L (ref 3.5–5.2)
Sodium: 141 mmol/L (ref 134–144)
Total Protein: 6.6 g/dL (ref 6.0–8.5)

## 2020-04-16 LAB — CBC WITH DIFFERENTIAL/PLATELET
Basophils Absolute: 0.1 10*3/uL (ref 0.0–0.2)
Basos: 1 %
EOS (ABSOLUTE): 0.1 10*3/uL (ref 0.0–0.4)
Eos: 2 %
Hematocrit: 37.9 % (ref 34.0–46.6)
Hemoglobin: 12.1 g/dL (ref 11.1–15.9)
Immature Grans (Abs): 0 10*3/uL (ref 0.0–0.1)
Immature Granulocytes: 1 %
Lymphocytes Absolute: 1.8 10*3/uL (ref 0.7–3.1)
Lymphs: 28 %
MCH: 27.9 pg (ref 26.6–33.0)
MCHC: 31.9 g/dL (ref 31.5–35.7)
MCV: 88 fL (ref 79–97)
Monocytes Absolute: 0.5 10*3/uL (ref 0.1–0.9)
Monocytes: 8 %
Neutrophils Absolute: 3.9 10*3/uL (ref 1.4–7.0)
Neutrophils: 60 %
Platelets: 303 10*3/uL (ref 150–450)
RBC: 4.33 x10E6/uL (ref 3.77–5.28)
RDW: 13.2 % (ref 11.7–15.4)
WBC: 6.3 10*3/uL (ref 3.4–10.8)

## 2020-04-16 LAB — HEMOGLOBIN A1C
Est. average glucose Bld gHb Est-mCnc: 154 mg/dL
Hgb A1c MFr Bld: 7 % — ABNORMAL HIGH (ref 4.8–5.6)

## 2020-04-16 LAB — CARDIOVASCULAR RISK ASSESSMENT

## 2020-04-18 ENCOUNTER — Encounter: Payer: Self-pay | Admitting: Family Medicine

## 2020-04-28 NOTE — Progress Notes (Signed)
I left a detailed message on Mrs. Whitford's Advertising account executive. I do not have a good explanation for changes in cholesterol and A1c.  I would recommend changes a written in previous note.

## 2020-05-04 ENCOUNTER — Encounter: Payer: Self-pay | Admitting: Family Medicine

## 2020-05-25 ENCOUNTER — Other Ambulatory Visit: Payer: Self-pay

## 2020-05-25 ENCOUNTER — Ambulatory Visit (INDEPENDENT_AMBULATORY_CARE_PROVIDER_SITE_OTHER): Payer: Medicare Other

## 2020-05-25 DIAGNOSIS — Z23 Encounter for immunization: Secondary | ICD-10-CM | POA: Diagnosis not present

## 2020-05-25 NOTE — Progress Notes (Signed)
° °  Covid-19 Vaccination Clinic  Name:  BRANDAN GLAUBER    MRN: [id number] DOB: 07-14-1946  05/25/2020  Ms. Uliano was observed post Covid-19 immunization for 15 minutes without incident. She was provided with Vaccine Information Sheet and instruction to access the V-Safe system.   Ms. Kibler was instructed to call [phone] with any severe reactions post vaccine:  Difficulty breathing   Swelling of face and throat   A fast heartbeat   A bad rash all over body   Dizziness and weakness

## 2020-06-03 DIAGNOSIS — Z124 Encounter for screening for malignant neoplasm of cervix: Secondary | ICD-10-CM | POA: Diagnosis not present

## 2020-06-03 DIAGNOSIS — Z6830 Body mass index (BMI) 30.0-30.9, adult: Secondary | ICD-10-CM | POA: Diagnosis not present

## 2020-07-27 ENCOUNTER — Other Ambulatory Visit: Payer: Self-pay | Admitting: Family Medicine

## 2020-08-17 ENCOUNTER — Other Ambulatory Visit: Payer: Self-pay | Admitting: Family Medicine

## 2020-08-17 ENCOUNTER — Ambulatory Visit (INDEPENDENT_AMBULATORY_CARE_PROVIDER_SITE_OTHER): Payer: Medicare Other | Admitting: Family Medicine

## 2020-08-17 ENCOUNTER — Other Ambulatory Visit: Payer: Self-pay

## 2020-08-17 VITALS — BP 136/72 | HR 100 | Temp 96.4°F | Resp 14 | Ht 62.0 in | Wt 164.0 lb

## 2020-08-17 DIAGNOSIS — I10 Essential (primary) hypertension: Secondary | ICD-10-CM | POA: Diagnosis not present

## 2020-08-17 DIAGNOSIS — Z780 Asymptomatic menopausal state: Secondary | ICD-10-CM | POA: Diagnosis not present

## 2020-08-17 DIAGNOSIS — E785 Hyperlipidemia, unspecified: Secondary | ICD-10-CM | POA: Diagnosis not present

## 2020-08-17 DIAGNOSIS — E1169 Type 2 diabetes mellitus with other specified complication: Secondary | ICD-10-CM

## 2020-08-17 DIAGNOSIS — Z13820 Encounter for screening for osteoporosis: Secondary | ICD-10-CM

## 2020-08-17 DIAGNOSIS — M25542 Pain in joints of left hand: Secondary | ICD-10-CM

## 2020-08-17 DIAGNOSIS — E782 Mixed hyperlipidemia: Secondary | ICD-10-CM

## 2020-08-17 DIAGNOSIS — M25541 Pain in joints of right hand: Secondary | ICD-10-CM

## 2020-08-17 LAB — POCT UA - MICROALBUMIN: Microalbumin Ur, POC: 80 mg/L

## 2020-08-17 MED ORDER — AMLODIPINE BESYLATE 5 MG PO TABS: 5.0000 mg | ORAL_TABLET | Freq: Every day | ORAL | 1 refills | Status: DC

## 2020-08-17 MED ORDER — FENOFIBRATE 150 MG PO CAPS: ORAL_CAPSULE | ORAL | 1 refills | Status: DC

## 2020-08-17 NOTE — Progress Notes (Signed)
Subjective:  Patient ID: Denise [name], female    DOB: November 18, 1945  Age: 75 y.o. MRN: [id number]  Chief Complaint  Patient presents with  . Diabetes  . Hyperlipidemia  . Hypertension    HPI Diabetes: eating healthy. Difficulty with exercise. Takes tylenol.  Hyperlipidemia:  Taking fenofibrate regularly. Low fat diet.  Arthritis : rt knee, hands, rt hip and low back pain. Hands are a little stiff,   Hypertension: on telmisartan 80 mg daily and amlodipine 5 mg once daily.   Current Outpatient Medications on File Prior to Visit  Medication Sig Dispense Refill  . Calcium Carb-Cholecalciferol (CALCIUM 600 + D PO) Take 1 tablet by mouth daily.    . celecoxib (CELEBREX) 200 MG capsule TAKE 1 CAPSULE BY MOUTH EVERY DAY 90 capsule 0  . mometasone (ELOCON) 0.1 % cream mometasone 0.1 % topical cream  APPLY SPARINGLY TO AFFECTED AREA EVERY DAY    . Multiple Vitamin (MULTIVITAMIN) tablet Take 1 tablet by mouth daily.      Marland Kitchen telmisartan (MICARDIS) 80 MG tablet Take 80 mg by mouth daily.    . vitamin C (ASCORBIC ACID) 500 MG tablet Take 500 mg by mouth daily.     No current facility-administered medications on file prior to visit.   Past Medical History:  Diagnosis Date  . Arthritis   . Constipation   . HTN (hypertension) 06/15/2015  . Pneumonia    Past Surgical History:  Procedure Laterality Date  . ANKLE SURGERY    . BREAST LUMPECTOMY     5 times  . CARPAL TUNNEL RELEASE     10/2015  . CATARACT EXTRACTION    . CHOLECYSTECTOMY    . NASAL SINUS SURGERY    . right knee arthroscopic    . ROTATOR CUFF REPAIR    . TOTAL KNEE ARTHROPLASTY Right 03/28/2017   Procedure: RIGHT TOTAL KNEE ARTHROPLASTY;  Surgeon: Latanya Maudlin, MD;  Location: [facility];  Service: Orthopedics;  Laterality: Right;  . TUBAL LIGATION      Family History  Problem Relation Age of Onset  . Liver disease Father   . Cirrhosis Father   . Bone cancer Mother   . Breast cancer Other        aunts  . Colon cancer  Neg Hx    Social History   Socioeconomic History  . Marital status: Married    Spouse name: Not on file  . Number of children: Not on file  . Years of education: Not on file  . Highest education level: Not on file  Occupational History  . Occupation: Self Employed  Tobacco Use  . Smoking status: Never Smoker  . Smokeless tobacco: Never Used  Vaping Use  . Vaping Use: Never used  Substance and Sexual Activity  . Alcohol use: No  . Drug use: No  . Sexual activity: Yes  Other Topics Concern  . Not on file  Social History Narrative  . Not on file   Social Determinants of Health   Financial Resource Strain: Not on file  Food Insecurity: Not on file  Transportation Needs: Not on file  Physical Activity: Not on file  Stress: Not on file  Social Connections: Not on file    Review of Systems  Constitutional: Positive for fatigue. Negative for chills and fever.  HENT: Negative for congestion, rhinorrhea and sore throat.   Respiratory: Positive for cough and shortness of breath.   Cardiovascular: Negative for chest pain.  Gastrointestinal: Negative for abdominal pain,  constipation, diarrhea, nausea and vomiting.  Genitourinary: Negative for dysuria and urgency.  Musculoskeletal: Positive for arthralgias and back pain. Negative for myalgias.  Neurological: Negative for dizziness, weakness, light-headedness and headaches.  Psychiatric/Behavioral: Negative for dysphoric mood. The patient is not nervous/anxious.     Objective:  BP 136/72   Pulse 100   Temp (!) 96.4 F (35.8 C)   Resp 14   Ht 5\' 2"  (1.575 m)   Wt 164 lb (74.4 kg)   BMI 30.00 kg/m   BP/Weight 08/17/2020 11/06/3242 0/08/270  Systolic BP 536 644 034  Diastolic BP 72 64 64  Wt. (Lbs) 164 168.2 173.4  BMI 30 30.76 31.72    Physical Exam Vitals reviewed.  Constitutional:      Appearance: Normal appearance. She is normal weight.  Neck:     Vascular: No carotid bruit.  Cardiovascular:     Rate and  Rhythm: Normal rate and regular rhythm.     Pulses: Normal pulses.     Heart sounds: Normal heart sounds.  Pulmonary:     Effort: Pulmonary effort is normal. No respiratory distress.     Breath sounds: Normal breath sounds.  Abdominal:     General: Abdomen is flat. Bowel sounds are normal.     Palpations: Abdomen is soft.     Tenderness: There is no abdominal tenderness.  Musculoskeletal:        General: Swelling (interosseous muscles BL hands. No FM trigger points. ) present.  Neurological:     Mental Status: She is alert and oriented to person, place, and time.  Psychiatric:        Mood and Affect: Mood normal.        Behavior: Behavior normal.      Lab Results  Component Value Date   WBC 6.3 04/15/2020   HGB 12.1 04/15/2020   HCT 37.9 04/15/2020   PLT 303 04/15/2020   GLUCOSE 118 (H) 04/15/2020   CHOL 193 04/15/2020   TRIG 223 (H) 04/15/2020   HDL 43 04/15/2020   LDLCALC 111 (H) 04/15/2020   ALT 10 04/15/2020   AST 16 04/15/2020   NA 141 04/15/2020   K 4.3 04/15/2020   CL 106 04/15/2020   CREATININE 1.18 (H) 04/15/2020   BUN 19 04/15/2020   CO2 21 04/15/2020   TSH 2.020 10/14/2019   INR 0.94 03/20/2017   HGBA1C 7.0 (H) 04/15/2020   MICROALBUR 80 08/17/2020      Assessment & Plan:   1. Essential hypertension, benign The current medical regimen is effective;  continue present plan and medications. Low salt diet and exercise recommended.  - TSH - amLODipine (NORVASC) 5 MG tablet; Take 1 tablet (5 mg total) by mouth daily.  Dispense: 90 tablet; Refill: 1  2. Dyslipidemia associated with type 2 diabetes mellitus (HCC) Low sugar diet and exercise.  Check feet daily.  Annual eye exams. - CBC with Differential/Platelet - Comprehensive metabolic panel - Hemoglobin A1c - POCT UA - Microalbumin  3. Mixed hyperlipidemia Low fat diet and exercise.  Continue fenofibrate. - CBC with Differential/Platelet - Comprehensive metabolic panel - Lipid panel -  TSH  4. Arthralgia of both hands Rule out RA. - TSH - CYCLIC CITRUL PEPTIDE ANTIBODY, IGG/IGA - Rheumatoid factor - Sedimentation rate  5. Screening for osteoporosis  Order Dexa  Meds ordered this encounter  Medications  . amLODipine (NORVASC) 5 MG tablet    Sig: Take 1 tablet (5 mg total) by mouth daily.    Dispense:  90 tablet    Refill:  1  . DISCONTD: Fenofibrate 150 MG CAPS    Sig: TAKE 1 CAPSULE ONCE DAILY  WITH FOOD    Dispense:  90 capsule    Refill:  1    Orders Placed This Encounter  Procedures  . CBC with Differential/Platelet  . Comprehensive metabolic panel  . Hemoglobin A1c  . Lipid panel  . TSH  . CYCLIC CITRUL PEPTIDE ANTIBODY, IGG/IGA  . Rheumatoid factor  . Sedimentation rate  . POCT UA - Microalbumin  . HM COLONOSCOPY    Follow-up: Return in about 3 months (around 11/15/2020) for fasting.  An After Visit Summary was printed and given to the patient.  Rochel Brome, MD [facility] [phone]

## 2020-08-18 LAB — COMPREHENSIVE METABOLIC PANEL
ALT: 9 IU/L (ref 0–32)
AST: 18 IU/L (ref 0–40)
Albumin/Globulin Ratio: 1.7 (ref 1.2–2.2)
Albumin: 4.4 g/dL (ref 3.7–4.7)
Alkaline Phosphatase: 76 IU/L (ref 44–121)
BUN/Creatinine Ratio: 15 (ref 12–28)
BUN: 19 mg/dL (ref 8–27)
Bilirubin Total: 0.5 mg/dL (ref 0.0–1.2)
CO2: 23 mmol/L (ref 20–29)
Calcium: 10.4 mg/dL — ABNORMAL HIGH (ref 8.7–10.3)
Chloride: 102 mmol/L (ref 96–106)
Creatinine, Ser: 1.24 mg/dL — ABNORMAL HIGH (ref 0.57–1.00)
GFR calc Af Amer: 49 mL/min/{1.73_m2} — ABNORMAL LOW (ref >59)
GFR calc non Af Amer: 43 mL/min/{1.73_m2} — ABNORMAL LOW (ref >59)
Globulin, Total: 2.6 g/dL (ref 1.5–4.5)
Glucose: 119 mg/dL — ABNORMAL HIGH (ref 65–99)
Potassium: 4.8 mmol/L (ref 3.5–5.2)
Sodium: 140 mmol/L (ref 134–144)
Total Protein: 7 g/dL (ref 6.0–8.5)

## 2020-08-18 LAB — HEMOGLOBIN A1C
Est. average glucose Bld gHb Est-mCnc: 146 mg/dL
Hgb A1c MFr Bld: 6.7 % — ABNORMAL HIGH (ref 4.8–5.6)

## 2020-08-18 LAB — LIPID PANEL
Chol/HDL Ratio: 4 ratio (ref 0.0–4.4)
Cholesterol, Total: 179 mg/dL (ref 100–199)
HDL: 45 mg/dL (ref >39)
LDL Chol Calc (NIH): 107 mg/dL — ABNORMAL HIGH (ref 0–99)
Triglycerides: 155 mg/dL — ABNORMAL HIGH (ref 0–149)
VLDL Cholesterol Cal: 27 mg/dL (ref 5–40)

## 2020-08-18 LAB — CBC WITH DIFFERENTIAL/PLATELET
Basophils Absolute: 0.1 10*3/uL (ref 0.0–0.2)
Basos: 1 %
EOS (ABSOLUTE): 0.1 10*3/uL (ref 0.0–0.4)
Eos: 2 %
Hematocrit: 39.3 % (ref 34.0–46.6)
Hemoglobin: 13 g/dL (ref 11.1–15.9)
Immature Grans (Abs): 0 10*3/uL (ref 0.0–0.1)
Immature Granulocytes: 1 %
Lymphocytes Absolute: 1.7 10*3/uL (ref 0.7–3.1)
Lymphs: 26 %
MCH: 28.9 pg (ref 26.6–33.0)
MCHC: 33.1 g/dL (ref 31.5–35.7)
MCV: 87 fL (ref 79–97)
Monocytes Absolute: 0.5 10*3/uL (ref 0.1–0.9)
Monocytes: 7 %
Neutrophils Absolute: 4.2 10*3/uL (ref 1.4–7.0)
Neutrophils: 63 %
Platelets: 325 10*3/uL (ref 150–450)
RBC: 4.5 x10E6/uL (ref 3.77–5.28)
RDW: 12.4 % (ref 11.7–15.4)
WBC: 6.6 10*3/uL (ref 3.4–10.8)

## 2020-08-18 LAB — CYCLIC CITRUL PEPTIDE ANTIBODY, IGG/IGA: Cyclic Citrullin Peptide Ab: 8 units (ref 0–19)

## 2020-08-18 LAB — TSH: TSH: 2 u[IU]/mL (ref 0.450–4.500)

## 2020-08-18 LAB — SEDIMENTATION RATE: Sed Rate: 8 mm/hr (ref 0–40)

## 2020-08-18 LAB — RHEUMATOID FACTOR: Rheumatoid fact SerPl-aCnc: <10 IU/mL (ref <14.0)

## 2020-08-18 LAB — CARDIOVASCULAR RISK ASSESSMENT

## 2020-09-04 ENCOUNTER — Other Ambulatory Visit: Payer: Self-pay | Admitting: Family Medicine

## 2020-09-04 DIAGNOSIS — E782 Mixed hyperlipidemia: Secondary | ICD-10-CM

## 2020-09-06 ENCOUNTER — Other Ambulatory Visit: Payer: Self-pay | Admitting: Family Medicine

## 2020-09-06 MED ORDER — FENOFIBRATE 160 MG PO TABS: 160.0000 mg | ORAL_TABLET | Freq: Every day | ORAL | 1 refills | Status: DC

## 2020-09-13 ENCOUNTER — Other Ambulatory Visit: Payer: Medicare Other

## 2020-09-13 ENCOUNTER — Other Ambulatory Visit: Payer: Self-pay | Admitting: Family Medicine

## 2020-09-13 ENCOUNTER — Encounter: Payer: Self-pay | Admitting: Family Medicine

## 2020-09-13 DIAGNOSIS — Z13820 Encounter for screening for osteoporosis: Secondary | ICD-10-CM

## 2020-09-13 DIAGNOSIS — E1169 Type 2 diabetes mellitus with other specified complication: Secondary | ICD-10-CM

## 2020-09-13 DIAGNOSIS — Z780 Asymptomatic menopausal state: Secondary | ICD-10-CM

## 2020-09-13 NOTE — Telephone Encounter (Signed)
I sent order for bone density test to Referral coordinator, as well as referral to diabetes education at [facility].

## 2020-09-22 DIAGNOSIS — Z780 Asymptomatic menopausal state: Secondary | ICD-10-CM | POA: Diagnosis not present

## 2020-09-22 DIAGNOSIS — M810 Age-related osteoporosis without current pathological fracture: Secondary | ICD-10-CM | POA: Diagnosis not present

## 2020-09-22 DIAGNOSIS — M8589 Other specified disorders of bone density and structure, multiple sites: Secondary | ICD-10-CM | POA: Diagnosis not present

## 2020-09-23 DIAGNOSIS — E119 Type 2 diabetes mellitus without complications: Secondary | ICD-10-CM | POA: Diagnosis not present

## 2020-10-12 ENCOUNTER — Other Ambulatory Visit: Payer: Self-pay | Admitting: Family Medicine

## 2020-10-12 MED ORDER — TELMISARTAN 80 MG PO TABS: 80.0000 mg | ORAL_TABLET | Freq: Every day | ORAL | 0 refills | Status: DC

## 2020-10-22 ENCOUNTER — Other Ambulatory Visit: Payer: Self-pay | Admitting: Obstetrics & Gynecology

## 2020-10-22 ENCOUNTER — Ambulatory Visit
Admission: RE | Admit: 2020-10-22 | Discharge: 2020-10-22 | Disposition: A | Discharge status: Home / Self Care | Payer: Medicare Other | Source: Ambulatory Visit | Attending: Obstetrics & Gynecology | Admitting: Obstetrics & Gynecology

## 2020-10-22 ENCOUNTER — Other Ambulatory Visit: Payer: Self-pay

## 2020-10-22 DIAGNOSIS — N6320 Unspecified lump in the left breast, unspecified quadrant: Secondary | ICD-10-CM

## 2020-10-22 DIAGNOSIS — N6323 Unspecified lump in the left breast, lower outer quadrant: Secondary | ICD-10-CM | POA: Diagnosis not present

## 2020-10-22 DIAGNOSIS — R922 Inconclusive mammogram: Secondary | ICD-10-CM | POA: Diagnosis not present

## 2020-11-08 DIAGNOSIS — M79672 Pain in left foot: Secondary | ICD-10-CM | POA: Diagnosis not present

## 2020-11-17 ENCOUNTER — Ambulatory Visit: Payer: Medicare Other | Admitting: Family Medicine

## 2020-11-29 DIAGNOSIS — M19272 Secondary osteoarthritis, left ankle and foot: Secondary | ICD-10-CM | POA: Diagnosis not present

## 2020-11-29 DIAGNOSIS — M7742 Metatarsalgia, left foot: Secondary | ICD-10-CM | POA: Diagnosis not present

## 2020-12-01 ENCOUNTER — Other Ambulatory Visit (HOSPITAL_COMMUNITY): Payer: Self-pay | Admitting: Orthopedic Surgery

## 2020-12-02 ENCOUNTER — Encounter (HOSPITAL_BASED_OUTPATIENT_CLINIC_OR_DEPARTMENT_OTHER): Payer: Self-pay | Admitting: Orthopedic Surgery

## 2020-12-02 ENCOUNTER — Other Ambulatory Visit: Payer: Self-pay

## 2020-12-06 ENCOUNTER — Ambulatory Visit (INDEPENDENT_AMBULATORY_CARE_PROVIDER_SITE_OTHER): Payer: Medicare Other | Admitting: Family Medicine

## 2020-12-06 ENCOUNTER — Other Ambulatory Visit: Payer: Self-pay

## 2020-12-06 ENCOUNTER — Other Ambulatory Visit: Payer: Medicare Other

## 2020-12-06 ENCOUNTER — Other Ambulatory Visit (HOSPITAL_COMMUNITY): Payer: Medicare Other

## 2020-12-06 ENCOUNTER — Ambulatory Visit (INDEPENDENT_AMBULATORY_CARE_PROVIDER_SITE_OTHER): Payer: Medicare Other

## 2020-12-06 VITALS — BP 118/64 | HR 81 | Temp 97.1°F | Ht 62.0 in | Wt 158.0 lb

## 2020-12-06 DIAGNOSIS — Z23 Encounter for immunization: Secondary | ICD-10-CM | POA: Diagnosis not present

## 2020-12-06 DIAGNOSIS — E1169 Type 2 diabetes mellitus with other specified complication: Secondary | ICD-10-CM | POA: Diagnosis not present

## 2020-12-06 DIAGNOSIS — Z6828 Body mass index (BMI) 28.0-28.9, adult: Secondary | ICD-10-CM

## 2020-12-06 DIAGNOSIS — I10 Essential (primary) hypertension: Secondary | ICD-10-CM | POA: Diagnosis not present

## 2020-12-06 DIAGNOSIS — E785 Hyperlipidemia, unspecified: Secondary | ICD-10-CM | POA: Diagnosis not present

## 2020-12-06 DIAGNOSIS — E782 Mixed hyperlipidemia: Secondary | ICD-10-CM

## 2020-12-06 LAB — POCT UA - MICROALBUMIN: Microalbumin Ur, POC: 30 mg/L

## 2020-12-06 MED ORDER — FENOFIBRATE 160 MG PO TABS: 160.0000 mg | ORAL_TABLET | Freq: Every day | ORAL | 1 refills | Status: DC

## 2020-12-06 NOTE — Progress Notes (Signed)
Subjective:  Patient ID: Denise [name], female    DOB: Aug 24, 1945  Age: 75 y.o. MRN: [id number]  Chief Complaint  Patient presents with  . Diabetes  . Hyperlipidemia  . Hypertension    HPI Diabetes:Currently on no medicines. Eating healthy. Exercising.  Hyperlipidemia: fenofibrate 160 mg once daily.  Hypertension. Amlodipine 5 mg daily and telmisartan 80 mg once daily. Doing 56 day diet. Limited Walking due to pain. Foot surgery scheduled this week for dorsal spur. Causes pain.   Current Outpatient Medications on File Prior to Visit  Medication Sig Dispense Refill  . amLODipine (NORVASC) 5 MG tablet Take 1 tablet (5 mg total) by mouth daily. 90 tablet 1  . Calcium Carb-Cholecalciferol (CALCIUM 600 + D PO) Take 1 tablet by mouth daily.    . celecoxib (CELEBREX) 200 MG capsule TAKE 1 CAPSULE BY MOUTH EVERY DAY 90 capsule 0  . docusate sodium (COLACE) 100 MG capsule Take 1 capsule (100 mg total) by mouth 2 (two) times daily. While taking narcotic pain medicine. 30 capsule 0  . mometasone (ELOCON) 0.1 % cream mometasone 0.1 % topical cream  APPLY SPARINGLY TO AFFECTED AREA EVERY DAY    . Multiple Vitamin (MULTIVITAMIN) tablet Take 1 tablet by mouth daily.    Marland Kitchen senna (SENOKOT) 8.6 MG TABS tablet Take 2 tablets (17.2 mg total) by mouth 2 (two) times daily. 30 tablet 0  . telmisartan (MICARDIS) 80 MG tablet Take 1 tablet (80 mg total) by mouth daily. 90 tablet 0  . vitamin C (ASCORBIC ACID) 500 MG tablet Take 500 mg by mouth daily.     No current facility-administered medications on file prior to visit.   Past Medical History:  Diagnosis Date  . Arthritis   . Constipation   . Diabetes mellitus without complication (HCC)    diet controlled  . HTN (hypertension) 06/15/2015  . Pneumonia    Past Surgical History:  Procedure Laterality Date  . ANKLE SURGERY    . BREAST LUMPECTOMY     5 times  . CARPAL TUNNEL RELEASE     10/2015  . CATARACT EXTRACTION    . CHOLECYSTECTOMY     . METATARSAL OSTEOTOMY Left 12/09/2020   Procedure: Left second metatarsal phalangeal joint debridement, 2-3 weil osteotomies;  Surgeon: Wylene Simmer, MD;  Location: [place];  Service: Orthopedics;  Laterality: Left;  60 mins [facility] regional vs General  . NASAL SINUS SURGERY    . right knee arthroscopic    . ROTATOR CUFF REPAIR    . TOTAL KNEE ARTHROPLASTY Right 03/28/2017   Procedure: RIGHT TOTAL KNEE ARTHROPLASTY;  Surgeon: Latanya Maudlin, MD;  Location: [facility];  Service: Orthopedics;  Laterality: Right;  . TUBAL LIGATION      Family History  Problem Relation Age of Onset  . Liver disease Father   . Cirrhosis Father   . Bone cancer Mother   . Breast cancer Other        aunts  . Colon cancer Neg Hx    Social History   Socioeconomic History  . Marital status: Married    Spouse name: Not on file  . Number of children: Not on file  . Years of education: Not on file  . Highest education level: Not on file  Occupational History  . Occupation: Self Employed  Tobacco Use  . Smoking status: Never Smoker  . Smokeless tobacco: Never Used  Vaping Use  . Vaping Use: Never used  Substance and Sexual Activity  .  Alcohol use: No  . Drug use: No  . Sexual activity: Yes  Other Topics Concern  . Not on file  Social History Narrative  . Not on file   Social Determinants of Health   Financial Resource Strain: Not on file  Food Insecurity: Not on file  Transportation Needs: Not on file  Physical Activity: Not on file  Stress: Not on file  Social Connections: Not on file    Review of Systems  Constitutional: Negative for chills, fatigue and fever.  HENT: Negative for congestion, ear pain, rhinorrhea and sore throat.   Respiratory: Negative for cough and shortness of breath.   Cardiovascular: Negative for chest pain.  Gastrointestinal: Positive for constipation. Negative for abdominal pain, diarrhea, nausea and vomiting.  Genitourinary: Negative for dysuria and  urgency.  Musculoskeletal: Negative for back pain and myalgias.  Neurological: Negative for dizziness, weakness, light-headedness, numbness and headaches.  Psychiatric/Behavioral: Negative for dysphoric mood. The patient is not nervous/anxious.      Objective:  BP 118/64   Pulse 81   Temp (!) 97.1 F (36.2 C)   Ht _0  (1.575 m)   Wt 158 lb (71.7 kg)   SpO2 98%   BMI 28.90 kg/m   BP/Weight 12/09/2020 12/06/2020 04/03/33  Systolic BP 917 915 056  Diastolic BP 59 64 72  Wt. (Lbs) 162.26 158 164  BMI 29.68 28.9 30    Physical Exam Vitals reviewed.  Constitutional:      Appearance: Normal appearance. She is normal weight.  Neck:     Vascular: No carotid bruit.  Cardiovascular:     Rate and Rhythm: Normal rate and regular rhythm.     Heart sounds: Normal heart sounds.  Pulmonary:     Effort: Pulmonary effort is normal. No respiratory distress.     Breath sounds: Normal breath sounds.  Abdominal:     General: Abdomen is flat. Bowel sounds are normal.     Palpations: Abdomen is soft.     Tenderness: There is no abdominal tenderness.  Neurological:     Mental Status: She is alert and oriented to person, place, and time.  Psychiatric:        Mood and Affect: Mood normal.        Behavior: Behavior normal.     Diabetic Foot Exam - Simple   Simple Foot Form Diabetic Foot exam was performed with the following findings: Yes 12/06/2020  9:09 AM  Visual Inspection See comments: Yes Sensation Testing Intact to touch and monofilament testing bilaterally: Yes Pulse Check Posterior Tibialis and Dorsalis pulse intact bilaterally: Yes Comments Right foot deformity on dorsal surface.       Lab Results  Component Value Date   WBC 5.6 12/06/2020   HGB 12.2 12/06/2020   HCT 38.5 12/06/2020   PLT 301 12/06/2020   GLUCOSE 109 (H) 12/06/2020   CHOL 160 12/06/2020   TRIG 121 12/06/2020   HDL 45 12/06/2020   LDLCALC 93 12/06/2020   ALT 15 12/06/2020   AST 25 12/06/2020   NA  142 12/06/2020   K 4.6 12/06/2020   CL 106 12/06/2020   CREATININE 1.20 (H) 12/06/2020   BUN 28 (H) 12/06/2020   CO2 21 12/06/2020   TSH 2.000 08/17/2020   INR 0.94 03/20/2017   HGBA1C 6.2 (H) 12/06/2020   MICROALBUR 30 12/06/2020      Assessment & Plan:   1. Dyslipidemia associated with type 2 diabetes mellitus ([place]) Control: improved. Recommend check sugars fasting daily. Recommend  check feet daily. Recommend annual eye exams. Medicines: none Continue to work on eating a healthy diet (56 day is a good diet.)   Labs drawn today.   - CBC with Differential/Platelet - Hemoglobin A1c - POCT UA - Microalbumin  2. Essential hypertension, benign Well controlled.  No changes to medicines.  Continue to work on eating a healthy diet and exercise.  Labs drawn today.  - Comprehensive metabolic panel  3. Mixed hyperlipidemia Well controlled.  No changes to medicines.  Continue to work on eating a healthy diet and exercise.  Labs drawn today.  - fenofibrate 160 MG tablet; Take 1 tablet (160 mg total) by mouth daily.  Dispense: 90 tablet; Refill: 1 - Lipid panel    Meds ordered this encounter  Medications  . fenofibrate 160 MG tablet    Sig: Take 1 tablet (160 mg total) by mouth daily.    Dispense:  90 tablet    Refill:  1    Orders Placed This Encounter  Procedures  . CBC with Differential/Platelet  . Comprehensive metabolic panel  . Lipid panel  . Hemoglobin A1c  . Cardiovascular Risk Assessment  . POCT UA - Microalbumin     Follow-up: Return in about 3 months (around 03/08/2021) for fasting.  An After Visit Summary was printed and given to the patient.  Rochel Brome, MD [facility] [phone]

## 2020-12-06 NOTE — Progress Notes (Signed)
   Covid-19 Vaccination Clinic  Name:  LASHAN MACIAS    MRN: [id number] DOB: 11-01-1945  12/06/2020  Ms. Amenta was observed post Covid-19 immunization for 15 minutes without incident. She was provided with Vaccine Information Sheet and instruction to access the V-Safe system.   Ms. Sandell was instructed to call 911 with any severe reactions post vaccine: Marland Kitchen Difficulty breathing  . Swelling of face and throat  . A fast heartbeat  . A bad rash all over body  . Dizziness and weakness

## 2020-12-07 ENCOUNTER — Encounter (HOSPITAL_BASED_OUTPATIENT_CLINIC_OR_DEPARTMENT_OTHER)
Admission: RE | Admit: 2020-12-07 | Discharge: 2020-12-07 | Disposition: A | Discharge status: Home / Self Care | Payer: Medicare Other | Source: Ambulatory Visit | Attending: Orthopedic Surgery | Admitting: Orthopedic Surgery

## 2020-12-07 ENCOUNTER — Other Ambulatory Visit (HOSPITAL_COMMUNITY)
Admission: RE | Admit: 2020-12-07 | Discharge: 2020-12-07 | Disposition: A | Discharge status: Home / Self Care | Payer: Medicare Other | Source: Ambulatory Visit | Attending: Orthopedic Surgery | Admitting: Orthopedic Surgery

## 2020-12-07 DIAGNOSIS — Z01812 Encounter for preprocedural laboratory examination: Secondary | ICD-10-CM | POA: Insufficient documentation

## 2020-12-07 DIAGNOSIS — Z20822 Contact with and (suspected) exposure to covid-19: Secondary | ICD-10-CM | POA: Diagnosis not present

## 2020-12-07 DIAGNOSIS — Z01810 Encounter for preprocedural cardiovascular examination: Secondary | ICD-10-CM | POA: Diagnosis not present

## 2020-12-07 LAB — CBC WITH DIFFERENTIAL/PLATELET
Basophils Absolute: 0.1 10*3/uL (ref 0.0–0.2)
Basos: 1 %
EOS (ABSOLUTE): 0.2 10*3/uL (ref 0.0–0.4)
Eos: 3 %
Hematocrit: 38.5 % (ref 34.0–46.6)
Hemoglobin: 12.2 g/dL (ref 11.1–15.9)
Immature Grans (Abs): 0 10*3/uL (ref 0.0–0.1)
Immature Granulocytes: 0 %
Lymphocytes Absolute: 1.6 10*3/uL (ref 0.7–3.1)
Lymphs: 29 %
MCH: 28.5 pg (ref 26.6–33.0)
MCHC: 31.7 g/dL (ref 31.5–35.7)
MCV: 90 fL (ref 79–97)
Monocytes Absolute: 0.5 10*3/uL (ref 0.1–0.9)
Monocytes: 8 %
Neutrophils Absolute: 3.3 10*3/uL (ref 1.4–7.0)
Neutrophils: 59 %
Platelets: 301 10*3/uL (ref 150–450)
RBC: 4.28 x10E6/uL (ref 3.77–5.28)
RDW: 12.6 % (ref 11.7–15.4)
WBC: 5.6 10*3/uL (ref 3.4–10.8)

## 2020-12-07 LAB — COMPREHENSIVE METABOLIC PANEL
ALT: 15 IU/L (ref 0–32)
AST: 25 IU/L (ref 0–40)
Albumin/Globulin Ratio: 2.6 — ABNORMAL HIGH (ref 1.2–2.2)
Albumin: 4.6 g/dL (ref 3.7–4.7)
Alkaline Phosphatase: 65 IU/L (ref 44–121)
BUN/Creatinine Ratio: 23 (ref 12–28)
BUN: 28 mg/dL — ABNORMAL HIGH (ref 8–27)
Bilirubin Total: 0.3 mg/dL (ref 0.0–1.2)
CO2: 21 mmol/L (ref 20–29)
Calcium: 9.5 mg/dL (ref 8.7–10.3)
Chloride: 106 mmol/L (ref 96–106)
Creatinine, Ser: 1.2 mg/dL — ABNORMAL HIGH (ref 0.57–1.00)
Globulin, Total: 1.8 g/dL (ref 1.5–4.5)
Glucose: 109 mg/dL — ABNORMAL HIGH (ref 65–99)
Potassium: 4.6 mmol/L (ref 3.5–5.2)
Sodium: 142 mmol/L (ref 134–144)
Total Protein: 6.4 g/dL (ref 6.0–8.5)
eGFR: 47 mL/min/{1.73_m2} — ABNORMAL LOW (ref >59)

## 2020-12-07 LAB — LIPID PANEL
Chol/HDL Ratio: 3.6 ratio (ref 0.0–4.4)
Cholesterol, Total: 160 mg/dL (ref 100–199)
HDL: 45 mg/dL (ref >39)
LDL Chol Calc (NIH): 93 mg/dL (ref 0–99)
Triglycerides: 121 mg/dL (ref 0–149)
VLDL Cholesterol Cal: 22 mg/dL (ref 5–40)

## 2020-12-07 LAB — SARS CORONAVIRUS 2 (TAT 6-24 HRS): SARS Coronavirus 2: NEGATIVE

## 2020-12-07 LAB — HEMOGLOBIN A1C
Est. average glucose Bld gHb Est-mCnc: 131 mg/dL
Hgb A1c MFr Bld: 6.2 % — ABNORMAL HIGH (ref 4.8–5.6)

## 2020-12-07 LAB — CARDIOVASCULAR RISK ASSESSMENT

## 2020-12-07 NOTE — Progress Notes (Signed)

## 2020-12-09 ENCOUNTER — Ambulatory Visit (HOSPITAL_BASED_OUTPATIENT_CLINIC_OR_DEPARTMENT_OTHER): Payer: Medicare Other | Admitting: Certified Registered"

## 2020-12-09 ENCOUNTER — Ambulatory Visit (HOSPITAL_BASED_OUTPATIENT_CLINIC_OR_DEPARTMENT_OTHER)
Admission: RE | Admit: 2020-12-09 | Discharge: 2020-12-09 | Disposition: A | Discharge status: Home / Self Care | Payer: Medicare Other | Attending: Orthopedic Surgery | Admitting: Orthopedic Surgery

## 2020-12-09 ENCOUNTER — Encounter (HOSPITAL_BASED_OUTPATIENT_CLINIC_OR_DEPARTMENT_OTHER)
Admission: RE | Disposition: A | Discharge status: Home / Self Care | Payer: Self-pay | Source: Home / Self Care | Attending: Orthopedic Surgery

## 2020-12-09 ENCOUNTER — Encounter (HOSPITAL_BASED_OUTPATIENT_CLINIC_OR_DEPARTMENT_OTHER): Payer: Self-pay | Admitting: Orthopedic Surgery

## 2020-12-09 ENCOUNTER — Other Ambulatory Visit: Payer: Self-pay

## 2020-12-09 DIAGNOSIS — M899 Disorder of bone, unspecified: Secondary | ICD-10-CM | POA: Insufficient documentation

## 2020-12-09 DIAGNOSIS — M19172 Post-traumatic osteoarthritis, left ankle and foot: Secondary | ICD-10-CM | POA: Diagnosis not present

## 2020-12-09 DIAGNOSIS — M25775 Osteophyte, left foot: Secondary | ICD-10-CM | POA: Diagnosis not present

## 2020-12-09 DIAGNOSIS — E119 Type 2 diabetes mellitus without complications: Secondary | ICD-10-CM | POA: Insufficient documentation

## 2020-12-09 DIAGNOSIS — D1632 Benign neoplasm of short bones of left lower limb: Secondary | ICD-10-CM | POA: Diagnosis not present

## 2020-12-09 DIAGNOSIS — E782 Mixed hyperlipidemia: Secondary | ICD-10-CM | POA: Diagnosis not present

## 2020-12-09 DIAGNOSIS — Z79899 Other long term (current) drug therapy: Secondary | ICD-10-CM | POA: Diagnosis not present

## 2020-12-09 DIAGNOSIS — Z96651 Presence of right artificial knee joint: Secondary | ICD-10-CM | POA: Insufficient documentation

## 2020-12-09 DIAGNOSIS — Z791 Long term (current) use of non-steroidal anti-inflammatories (NSAID): Secondary | ICD-10-CM | POA: Insufficient documentation

## 2020-12-09 DIAGNOSIS — G8918 Other acute postprocedural pain: Secondary | ICD-10-CM | POA: Diagnosis not present

## 2020-12-09 DIAGNOSIS — M7742 Metatarsalgia, left foot: Secondary | ICD-10-CM | POA: Diagnosis not present

## 2020-12-09 DIAGNOSIS — M898X7 Other specified disorders of bone, ankle and foot: Secondary | ICD-10-CM | POA: Diagnosis not present

## 2020-12-09 DIAGNOSIS — M19272 Secondary osteoarthritis, left ankle and foot: Secondary | ICD-10-CM | POA: Diagnosis not present

## 2020-12-09 DIAGNOSIS — Z888 Allergy status to other drugs, medicaments and biological substances status: Secondary | ICD-10-CM | POA: Diagnosis not present

## 2020-12-09 DIAGNOSIS — M19072 Primary osteoarthritis, left ankle and foot: Secondary | ICD-10-CM | POA: Insufficient documentation

## 2020-12-09 HISTORY — DX: Type 2 diabetes mellitus without complications: E11.9

## 2020-12-09 HISTORY — PX: METATARSAL OSTEOTOMY: SHX1641

## 2020-12-09 SURGERY — OSTEOTOMY, METATARSAL BONE
Anesthesia: General | Site: Toe | Laterality: Left

## 2020-12-09 MED ORDER — FENTANYL CITRATE (PF) 100 MCG/2ML IJ SOLN
50.0000 ug | Freq: Once | INTRAMUSCULAR | Status: AC
  Administered 2020-12-09: 50 ug via INTRAVENOUS

## 2020-12-09 MED ORDER — LIDOCAINE 2% (20 MG/ML) 5 ML SYRINGE
INTRAMUSCULAR | Status: DC | PRN
  Administered 2020-12-09: 30 mg via INTRAVENOUS

## 2020-12-09 MED ORDER — OXYCODONE HCL 5 MG PO TABS: 5.0000 mg | ORAL_TABLET | Freq: Four times a day (QID) | ORAL | 0 refills | Status: AC | PRN

## 2020-12-09 MED ORDER — DOCUSATE SODIUM 100 MG PO CAPS: 100.0000 mg | ORAL_CAPSULE | Freq: Two times a day (BID) | ORAL | 0 refills | Status: DC

## 2020-12-09 MED ORDER — FENTANYL CITRATE (PF) 100 MCG/2ML IJ SOLN
INTRAMUSCULAR | Status: AC
  Filled 2020-12-09: qty 2

## 2020-12-09 MED ORDER — MIDAZOLAM HCL 2 MG/2ML IJ SOLN
2.0000 mg | Freq: Once | INTRAMUSCULAR | Status: AC
  Administered 2020-12-09: 2 mg via INTRAVENOUS

## 2020-12-09 MED ORDER — AMISULPRIDE (ANTIEMETIC) 5 MG/2ML IV SOLN: 10.0000 mg | Freq: Once | INTRAVENOUS | Status: DC | PRN

## 2020-12-09 MED ORDER — DEXAMETHASONE SODIUM PHOSPHATE 10 MG/ML IJ SOLN
INTRAMUSCULAR | Status: DC | PRN
  Administered 2020-12-09: 5 mg via INTRAVENOUS

## 2020-12-09 MED ORDER — LACTATED RINGERS IV SOLN: INTRAVENOUS | Status: DC

## 2020-12-09 MED ORDER — ONDANSETRON HCL 4 MG/2ML IJ SOLN
INTRAMUSCULAR | Status: DC | PRN
  Administered 2020-12-09: 4 mg via INTRAVENOUS

## 2020-12-09 MED ORDER — PROMETHAZINE HCL 25 MG/ML IJ SOLN: 6.2500 mg | INTRAMUSCULAR | Status: DC | PRN

## 2020-12-09 MED ORDER — MIDAZOLAM HCL 2 MG/2ML IJ SOLN
INTRAMUSCULAR | Status: AC
  Filled 2020-12-09: qty 2

## 2020-12-09 MED ORDER — PROPOFOL 500 MG/50ML IV EMUL
INTRAVENOUS | Status: DC | PRN
  Administered 2020-12-09: 25 ug/kg/min via INTRAVENOUS

## 2020-12-09 MED ORDER — OXYCODONE HCL 5 MG/5ML PO SOLN: 5.0000 mg | Freq: Once | ORAL | Status: DC | PRN

## 2020-12-09 MED ORDER — HYDROMORPHONE HCL 1 MG/ML IJ SOLN: 0.2500 mg | INTRAMUSCULAR | Status: DC | PRN

## 2020-12-09 MED ORDER — PROPOFOL 10 MG/ML IV BOLUS
INTRAVENOUS | Status: DC | PRN
  Administered 2020-12-09: 150 mg via INTRAVENOUS

## 2020-12-09 MED ORDER — VANCOMYCIN HCL 500 MG IV SOLR
INTRAVENOUS | Status: AC
  Filled 2020-12-09: qty 500

## 2020-12-09 MED ORDER — SODIUM CHLORIDE 0.9 % IV SOLN: INTRAVENOUS | Status: DC

## 2020-12-09 MED ORDER — 0.9 % SODIUM CHLORIDE (POUR BTL) OPTIME
TOPICAL | Status: DC | PRN
  Administered 2020-12-09: 200 mL

## 2020-12-09 MED ORDER — CEFAZOLIN SODIUM-DEXTROSE 2-4 GM/100ML-% IV SOLN
2.0000 g | INTRAVENOUS | Status: AC
  Administered 2020-12-09: 2 g via INTRAVENOUS

## 2020-12-09 MED ORDER — SENNA 8.6 MG PO TABS: 2.0000 | ORAL_TABLET | Freq: Two times a day (BID) | ORAL | 0 refills | Status: DC

## 2020-12-09 MED ORDER — CEFAZOLIN SODIUM-DEXTROSE 2-4 GM/100ML-% IV SOLN
INTRAVENOUS | Status: AC
  Filled 2020-12-09: qty 100

## 2020-12-09 MED ORDER — VANCOMYCIN HCL 500 MG IV SOLR
INTRAVENOUS | Status: DC | PRN
  Administered 2020-12-09: 500 mg via TOPICAL

## 2020-12-09 MED ORDER — ROPIVACAINE HCL 5 MG/ML IJ SOLN
INTRAMUSCULAR | Status: DC | PRN
  Administered 2020-12-09: 30 mL via PERINEURAL

## 2020-12-09 MED ORDER — OXYCODONE HCL 5 MG PO TABS: 5.0000 mg | ORAL_TABLET | Freq: Once | ORAL | Status: DC | PRN

## 2020-12-09 SURGICAL SUPPLY — 61 items
APL PRP STRL LF DISP 70% ISPRP (MISCELLANEOUS) ×1
BANDAGE ESMARK 6X9 LF (GAUZE/BANDAGES/DRESSINGS) IMPLANT
BLADE AVERAGE 25X9 (BLADE) IMPLANT
BLADE LONG MED 25X9 (BLADE) ×2 IMPLANT
BLADE SURG 15 STRL LF DISP TIS (BLADE) ×2 IMPLANT
BLADE SURG 15 STRL SS (BLADE) ×4
BNDG CMPR 9X4 STRL LF SNTH (GAUZE/BANDAGES/DRESSINGS)
BNDG CMPR 9X6 STRL LF SNTH (GAUZE/BANDAGES/DRESSINGS)
BNDG COHESIVE 4X5 TAN STRL (GAUZE/BANDAGES/DRESSINGS) ×2 IMPLANT
BNDG CONFORM 2 STRL LF (GAUZE/BANDAGES/DRESSINGS) IMPLANT
BNDG CONFORM 3 STRL LF (GAUZE/BANDAGES/DRESSINGS) ×2 IMPLANT
BNDG ESMARK 4X9 LF (GAUZE/BANDAGES/DRESSINGS) IMPLANT
BNDG ESMARK 6X9 LF (GAUZE/BANDAGES/DRESSINGS)
CHLORAPREP W/TINT 26 (MISCELLANEOUS) ×2 IMPLANT
COVER BACK TABLE 60X90IN (DRAPES) ×2 IMPLANT
COVER WAND RF STERILE (DRAPES) IMPLANT
CUFF TOURN SGL QUICK 24 (TOURNIQUET CUFF)
CUFF TOURN SGL QUICK 34 (TOURNIQUET CUFF)
CUFF TRNQT CYL 24X4X16.5-23 (TOURNIQUET CUFF) IMPLANT
CUFF TRNQT CYL 34X4.125X (TOURNIQUET CUFF) IMPLANT
DRAPE EXTREMITY T 121X128X90 (DISPOSABLE) ×2 IMPLANT
DRAPE OEC MINIVIEW 54X84 (DRAPES) ×2 IMPLANT
DRAPE U-SHAPE 47X51 STRL (DRAPES) ×2 IMPLANT
DRSG MEPITEL 4X7.2 (GAUZE/BANDAGES/DRESSINGS) IMPLANT
DRSG PAD ABDOMINAL 8X10 ST (GAUZE/BANDAGES/DRESSINGS) IMPLANT
ELECT REM PT RETURN 9FT ADLT (ELECTROSURGICAL) ×2
ELECTRODE REM PT RTRN 9FT ADLT (ELECTROSURGICAL) ×1 IMPLANT
GAUZE SPONGE 4X4 12PLY STRL (GAUZE/BANDAGES/DRESSINGS) ×2 IMPLANT
GLOVE SRG 8 PF TXTR STRL LF DI (GLOVE) ×2 IMPLANT
GLOVE SURG ENC MOIS LTX SZ8 (GLOVE) ×2 IMPLANT
GLOVE SURG LTX SZ8 (GLOVE) ×2 IMPLANT
GLOVE SURG UNDER POLY LF SZ8 (GLOVE) ×4
GOWN STRL REUS W/ TWL LRG LVL3 (GOWN DISPOSABLE) ×1 IMPLANT
GOWN STRL REUS W/ TWL XL LVL3 (GOWN DISPOSABLE) ×2 IMPLANT
GOWN STRL REUS W/TWL LRG LVL3 (GOWN DISPOSABLE) ×2
GOWN STRL REUS W/TWL XL LVL3 (GOWN DISPOSABLE) ×4
NEEDLE HYPO 22GX1.5 SAFETY (NEEDLE) IMPLANT
NS IRRIG 1000ML POUR BTL (IV SOLUTION) ×2 IMPLANT
PACK BASIN DAY SURGERY FS (CUSTOM PROCEDURE TRAY) ×2 IMPLANT
PAD CAST 4YDX4 CTTN HI CHSV (CAST SUPPLIES) ×1 IMPLANT
PADDING CAST COTTON 4X4 STRL (CAST SUPPLIES) ×2
PENCIL SMOKE EVACUATOR (MISCELLANEOUS) ×2 IMPLANT
SANITIZER HAND PURELL 535ML FO (MISCELLANEOUS) ×2 IMPLANT
SCREW HCS TWIST-OFF 2.0X12MM (Screw) ×4 IMPLANT
SHEET MEDIUM DRAPE 40X70 STRL (DRAPES) ×2 IMPLANT
SPONGE LAP 18X18 RF (DISPOSABLE) ×2 IMPLANT
STOCKINETTE 6  STRL (DRAPES) ×2
STOCKINETTE 6 STRL (DRAPES) ×1 IMPLANT
SUCTION FRAZIER HANDLE 10FR (MISCELLANEOUS)
SUCTION TUBE FRAZIER 10FR DISP (MISCELLANEOUS) IMPLANT
SUT ETHILON 3 0 PS 1 (SUTURE) ×2 IMPLANT
SUT MNCRL AB 3-0 PS2 18 (SUTURE) ×2 IMPLANT
SUT VIC AB 2-0 SH 27 (SUTURE)
SUT VIC AB 2-0 SH 27XBRD (SUTURE) IMPLANT
SUT VICRYL 0 UR6 27IN ABS (SUTURE) IMPLANT
SYR BULB EAR ULCER 3OZ GRN STR (SYRINGE) ×2 IMPLANT
SYR CONTROL 10ML LL (SYRINGE) IMPLANT
TOWEL GREEN STERILE FF (TOWEL DISPOSABLE) ×2 IMPLANT
TUBE CONNECTING 20X1/4 (TUBING) IMPLANT
UNDERPAD 30X36 HEAVY ABSORB (UNDERPADS AND DIAPERS) ×2 IMPLANT
YANKAUER SUCT BULB TIP NO VENT (SUCTIONS) IMPLANT

## 2020-12-09 NOTE — Anesthesia Preprocedure Evaluation (Signed)
Anesthesia Evaluation  Patient identified by MRN, date of birth, ID band Patient awake    Reviewed: Allergy & Precautions, NPO status , Patient's Chart, lab work & pertinent test results  History of Anesthesia Complications Negative for: history of anesthetic complications  Airway Mallampati: II  TM Distance: >3 FB Neck ROM: Full    Dental  (+) Dental Advisory Given   Pulmonary neg pulmonary ROS,    breath sounds clear to auscultation       Cardiovascular hypertension, Pt. on medications (-) angina Rhythm:Regular Rate:Normal  '16 Nuclear stress EF: 72%. no ST segment deviation noted during stress. medium defect of mild severity present in the mid anterior, mid anteroseptal and apical septal location, consistent with breast attenuation artifact.  The study is normal.  This is a low risk study   Neuro/Psych negative neurological ROS     GI/Hepatic Neg liver ROS, GERD  Medicated and Controlled,  Endo/Other  diabetesMorbid obesity  Renal/GU negative Renal ROS     Musculoskeletal  (+) Arthritis , Osteoarthritis,    Abdominal (+) + obese,   Peds  Hematology negative hematology ROS (+)   Anesthesia Other Findings   Reproductive/Obstetrics                             Anesthesia Physical  Anesthesia Plan  ASA: II  Anesthesia Plan: General   Post-op Pain Management: GA combined w/ Regional for post-op pain   Induction: Intravenous  PONV Risk Score and Plan: 3 and Ondansetron, Dexamethasone, Midazolam and Treatment may vary due to age or medical condition  Airway Management Planned: LMA  Additional Equipment:   Intra-op Plan:   Post-operative Plan: Extubation in OR  Informed Consent: I have reviewed the patients History and Physical, chart, labs and discussed the procedure including the risks, benefits and alternatives for the proposed anesthesia with the patient or authorized  representative who has indicated his/her understanding and acceptance.     Dental advisory given  Plan Discussed with: CRNA and Surgeon  Anesthesia Plan Comments: (Plan routine monitors, GA- LMA OK)        Anesthesia Quick Evaluation

## 2020-12-09 NOTE — Anesthesia Postprocedure Evaluation (Signed)
Anesthesia Post Note  Patient: Denise [name]  Procedure(s) Performed: Left second metatarsal phalangeal joint debridement, 2-3 weil osteotomies (Left Toe)     Patient location during evaluation: PACU Anesthesia Type: General Level of consciousness: awake and alert Pain management: pain level controlled Vital Signs Assessment: post-procedure vital signs reviewed and stable Respiratory status: spontaneous breathing, nonlabored ventilation and respiratory function stable Cardiovascular status: blood pressure returned to baseline and stable Postop Assessment: no apparent nausea or vomiting Anesthetic complications: no   No complications documented.  Last Vitals:  Vitals:   12/09/20 0815 12/09/20 0923  BP: 113/61 (!) 118/59  Pulse: 77 81  Resp: 15 16  Temp:  36.4 C  SpO2: 99% 97%    Last Pain:  Vitals:   12/09/20 0923  TempSrc:   PainSc: 0-No pain                 Lynda Rainwater

## 2020-12-09 NOTE — Discharge Instructions (Addendum)
Wylene Simmer, MD [facility]  Please read the following information regarding your care after surgery.  Medications  You only need a prescription for the narcotic pain medicine (ex. oxycodone, Percocet, Norco).  All of the other medicines listed below are available over the counter. X Celebrex that you have at home for the first 3 days after surgery. X acetominophen (Tylenol) 650 mg every 4-6 hours as you need for minor to moderate pain X oxycodone as prescribed for severe pain  Narcotic pain medicine (ex. oxycodone, Percocet, Vicodin) will cause constipation.  To prevent this problem, take the following medicines while you are taking any pain medicine. X docusate sodium (Colace) 100 mg twice a day X senna (Senokot) 2 tablets twice a day  Weight Bearing X Bear weight only on your operated foot in the post-op shoe.   Cast / Splint / Dressing X Keep your splint, cast or dressing clean and dry.  Don't put anything (coat hanger, pencil, etc) down inside of it.  If it gets damp, use a hair dryer on the cool setting to dry it.  If it gets soaked, call the office to schedule an appointment for a cast change.    After your dressing, cast or splint is removed; you may shower, but do not soak or scrub the wound.  Allow the water to run over it, and then gently pat it dry.  Swelling It is normal for you to have swelling where you had surgery.  To reduce swelling and pain, keep your toes above your nose for at least 3 days after surgery.  It may be necessary to keep your foot or leg elevated for several weeks.  If it hurts, it should be elevated.  Follow Up Call my office at [phone] when you are discharged from the hospital or surgery center to schedule an appointment to be seen two weeks after surgery.  Call my office at [phone] if you develop a fever >101.5 F, nausea, vomiting, bleeding from the surgical site or severe pain.      Post Anesthesia Home Care  Instructions  Activity: Get plenty of rest for the remainder of the day. A responsible individual must stay with you for 24 hours following the procedure.  For the next 24 hours, DO NOT: -Drive a car -Paediatric nurse -Drink alcoholic beverages -Take any medication unless instructed by your physician -Make any legal decisions or sign important papers.  Meals: Start with liquid foods such as gelatin or soup. Progress to regular foods as tolerated. Avoid greasy, spicy, heavy foods. If nausea and/or vomiting occur, drink only clear liquids until the nausea and/or vomiting subsides. Call your physician if vomiting continues.  Special Instructions/Symptoms: Your throat may feel dry or sore from the anesthesia or the breathing tube placed in your throat during surgery. If this causes discomfort, gargle with warm salt water. The discomfort should disappear within 24 hours.  If you had a scopolamine patch placed behind your ear for the management of post- operative nausea and/or vomiting:  1. The medication in the patch is effective for 72 hours, after which it should be removed.  Wrap patch in a tissue and discard in the trash. Wash hands thoroughly with soap and water. 2. You may remove the patch earlier than 72 hours if you experience unpleasant side effects which may include dry mouth, dizziness or visual disturbances. 3. Avoid touching the patch. Wash your hands with soap and water after contact with the patch.

## 2020-12-09 NOTE — H&P (Signed)
Denise [name] is an 75 y.o. female.   Chief Complaint: left forefoot pain HPI: 75 y/o female with [place] of diabetes c/o L forefoot pain worsening over the last few years.  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  She has signs and symptoms of metatarsalgia as well.  She has failed non op treatment including activity modification, shoewear modification and oral NSAIDs.  She presents now for surgical treatment.  Past Medical History:  Diagnosis Date  . Arthritis   . Constipation   . Diabetes mellitus without complication (HCC)    diet controlled  . HTN (hypertension) 06/15/2015  . Pneumonia     Past Surgical History:  Procedure Laterality Date  . ANKLE SURGERY    . BREAST LUMPECTOMY     5 times  . CARPAL TUNNEL RELEASE     10/2015  . CATARACT EXTRACTION    . CHOLECYSTECTOMY    . NASAL SINUS SURGERY    . right knee arthroscopic    . ROTATOR CUFF REPAIR    . TOTAL KNEE ARTHROPLASTY Right 03/28/2017   Procedure: RIGHT TOTAL KNEE ARTHROPLASTY;  Surgeon: Latanya Maudlin, MD;  Location: [facility];  Service: Orthopedics;  Laterality: Right;  . TUBAL LIGATION      Family History  Problem Relation Age of Onset  . Liver disease Father   . Cirrhosis Father   . Bone cancer Mother   . Breast cancer Other        aunts  . Colon cancer Neg Hx    Social History:  reports that she has never smoked. She has never used smokeless tobacco. She reports that she does not drink alcohol and does not use drugs.  Allergies:  Allergies  Allergen Reactions  . Lisinopril Other (See Comments)    cough    Medications Prior to Admission  Medication Sig Dispense Refill  . amLODipine (NORVASC) 5 MG tablet Take 1 tablet (5 mg total) by mouth daily. 90 tablet 1  . Calcium Carb-Cholecalciferol (CALCIUM 600 + D PO) Take 1 tablet by mouth daily.    . celecoxib (CELEBREX) 200 MG capsule TAKE 1 CAPSULE BY MOUTH EVERY DAY 90 capsule 0  . fenofibrate 160 MG tablet Take 1  tablet (160 mg total) by mouth daily. 90 tablet 1  . mometasone (ELOCON) 0.1 % cream mometasone 0.1 % topical cream  APPLY SPARINGLY TO AFFECTED AREA EVERY DAY    . Multiple Vitamin (MULTIVITAMIN) tablet Take 1 tablet by mouth daily.    Marland Kitchen telmisartan (MICARDIS) 80 MG tablet Take 1 tablet (80 mg total) by mouth daily. 90 tablet 0  . vitamin C (ASCORBIC ACID) 500 MG tablet Take 500 mg by mouth daily.      Results for orders placed or performed during the hospital encounter of 12/07/20 (from the past 48 hour(s))  SARS CORONAVIRUS 2 (TAT 6-24 HRS) Nasopharyngeal Nasopharyngeal Swab     Status: None   Collection Time: 12/07/20 10:21 AM   Specimen: Nasopharyngeal Swab  Result Value Ref Range   SARS Coronavirus 2 NEGATIVE NEGATIVE    Comment: (NOTE) SARS-CoV-2 target nucleic acids are NOT DETECTED.  The SARS-CoV-2 RNA is generally detectable in upper and lower respiratory specimens during the acute phase of infection. Negative results do not preclude SARS-CoV-2 infection, do not rule out co-infections with other pathogens, and should not be used as the sole basis for treatment or other patient management decisions. Negative results must be combined with clinical observations, patient  history, and epidemiological information. The expected result is Negative.  Fact Sheet for Patients: SugarRoll.be  Fact Sheet for Healthcare Providers: https://www.woods-mathews.com/  This test is not yet approved or cleared by the [place] FDA and  has been authorized for detection and/or diagnosis of SARS-CoV-2 by FDA under an Emergency Use Authorization (EUA). This EUA will remain  in effect (meaning this test can be used) for the duration of the COVID-19 declaration under Se ction 564(b)(1) of the Act, 21 U.S.C. section 360bbb-3(b)(1), unless the authorization is terminated or revoked sooner.  Performed at [facility], [place] [address]  [place], [place] [address]    No results found.  Review of Systems  No recent f/c/n/v/wt loss  Blood pressure 138/71, pulse 67, temperature 98.3 F (36.8 C), temperature source Oral, resp. rate 16, height 5\' 2"  (1.575 m), weight 73.6 kg, SpO2 100 %. Physical Exam  wn wd woman in nad.  A and O x 4.  Normal mood and affect.  EOMI.  Resp unlabored.  L forefoot with palpable bony mass at the dorsal MTPJ.  Skin healthy and intact.  No lymphadenopathy.  5/5 strength in PF and DF of the ankle and toes.  sens to LT intact at the forefoot plantar and dorsal.  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  To the OR for 2nd MT exostectomy and 2-3 Weil osteotomies.  The risks and benefits of the alternative treatment options have been discussed in detail.  The patient wishes to proceed with surgery and specifically understands risks of bleeding, infection, nerve damage, blood clots, need for additional surgery, amputation and death.   Wylene Simmer, MD Jan 03, 2021, 7:15 AM

## 2020-12-09 NOTE — Anesthesia Procedure Notes (Signed)
Anesthesia Regional Block: Popliteal block   Pre-Anesthetic Checklist: ,, timeout performed, Correct Patient, Correct Site, Correct Laterality, Correct Procedure, Correct Position, site marked, Risks and benefits discussed,  Surgical consent,  Pre-op evaluation,  At surgeon's request and post-op pain management  Laterality: Left  Prep: chloraprep       Needles:  Injection technique: Single-shot  Needle Type: Stimiplex     Needle Length: 9cm  Needle Gauge: 21     Additional Needles:   Procedures:,,,, ultrasound used (permanent image in chart),,,,  Narrative:  Start time: 12/09/2020 7:01 AM End time: 12/09/2020 7:06 AM Injection made incrementally with aspirations every 5 mL.  Performed by: Personally  Anesthesiologist: Lynda Rainwater, MD

## 2020-12-09 NOTE — Anesthesia Procedure Notes (Signed)
Procedure Name: LMA Insertion Date/Time: 12/09/2020 7:32 AM Performed by: Signe Colt, CRNA Pre-anesthesia Checklist: Patient identified, Emergency Drugs available, Suction available and Patient being monitored Patient Re-evaluated:Patient Re-evaluated prior to induction Oxygen Delivery Method: Circle System Utilized Preoxygenation: Pre-oxygenation with 100% oxygen Induction Type: IV induction Ventilation: Mask ventilation without difficulty LMA: LMA inserted LMA Size: 4.0 Number of attempts: 1 Airway Equipment and Method: bite block Placement Confirmation: positive ETCO2 Tube secured with: Tape Dental Injury: Teeth and Oropharynx as per pre-operative assessment

## 2020-12-09 NOTE — Op Note (Signed)
12/09/2020  8:30 AM  PATIENT:  Denise [name]  75 y.o. female  PRE-OPERATIVE DIAGNOSIS: 1.  Left second MTP joint arthritis with prominent dorsal exostosis 2.  Left forefoot metatarsalgia  POST-OPERATIVE DIAGNOSIS: Same  Procedure(s): 1.  Left second metatarsal head exostectomy 2.  Left second metatarsal Weil osteotomy 3.  Left third metatarsal Weil osteotomy 4.  Left foot AP and lateral radiographs  SURGEON:  Wylene Simmer, MD  ASSISTANT: Mechele Claude, PA-C  ANESTHESIA:   General, regional  EBL:  minimal   TOURNIQUET:   Total Tourniquet Time Documented: Thigh (Left) - 23 minutes Total: Thigh (Left) - 23 minutes  COMPLICATIONS:  None apparent  DISPOSITION:  Extubated, awake and stable to recovery.  INDICATION FOR PROCEDURE: The patient is a 75 year old female with a long history of left forefoot pain.  She has a large bony prominence over the dorsal aspect of the second metatarsal head and neck.  She also has signs and symptoms of metatarsalgia at the second and third rays.  She has failed nonoperative treatment to date including activity modification, oral anti-inflammatories and shoewear modification.  She presents now for surgical treatment of these painful and limiting left forefoot conditions.  The risks and benefits of the alternative treatment options have been discussed in detail.  The patient wishes to proceed with surgery and specifically understands risks of bleeding, infection, nerve damage, blood clots, need for additional surgery, amputation and death.  PROCEDURE IN DETAIL:  After pre operative consent was obtained, and the correct operative site was identified, the patient was brought to the operating room and placed supine on the OR table.  Anesthesia was administered.  Pre-operative antibiotics were administered.  A surgical timeout was taken.  The left lower extremity was prepped and draped in standard sterile fashion with a tourniquet around the thigh.  The  extremity was elevated and the tourniquet was inflated to 250 mmHg.  A longitudinal incision was made over the second webspace.  Dissection was carried down through the subcutaneous tissues.  The dorsal bony mass was identified over the second metatarsal head and neck.  The periosteum was incised and elevated medially and laterally.  Care was taken to protect the extensor digitorum longus and brevis tendons.  The large dorsal osteophyte was freed from the surrounding soft tissue.  It measured approximately 2 cm x 1 cm x 1 cm.  It was passed off the field as a specimen to pathology.  The second MTP joint was noted to have end-stage arthrosis with bone-on-bone changes throughout the joint.  Dorsal osteophytes were resected from the base of the proximal phalanx and from the dorsum of the head of the metatarsal.  A Weil osteotomy was then made with the oscillating saw.  The head of the metatarsal was allowed to retract proximally into the appropriate resting tension.  The osteotomy was fixed with a 2 mm [employer] FRS screw.  Overhanging bone was trimmed with a rondure.  Dissection was carried over to the third MTP joint.  The extensor tendons were protected and the dorsal joint capsule was incised and elevated medially and laterally.  The head of the metatarsal was exposed.  There was no significant degenerative change within the joint.  A Weil osteotomy was made allowing the head of the metatarsal to retract proximally to the appropriate resting tension.  The osteotomy was fixed with a 2 mm FRS screw.  Overhanging bone was trimmed with a rondure.  AP and lateral radiographs of the foot were  obtained showing appropriate position and length of the screws and appropriate shortening of the second and third metatarsals.  The wound was irrigated copiously.  The dorsal joint capsule and extensor tendons were imbricated with 2-0 Vicryl at the second MTP joint.  The wound was then sprinkled with vancomycin powder.   Skin incision was closed with horizontal mattress sutures of 3-0 nylon.  Sterile dressings were applied followed by compression wrap.  The tourniquet was released after application of the dressings.  The patient was awakened from anesthesia and transported to the recovery room in stable condition.   FOLLOW UP PLAN: Weightbearing as tolerated in a flat postop shoe.  Follow-up in the office in 2 weeks for suture removal and pathology results.  Plan immobilization in the postop shoe for 4 to 6 weeks postop.   RADIOGRAPHS: AP and lateral radiographs of the left foot are obtained intraoperatively.  These show interval removal of a large osteophyte from the second metatarsal head.  Interval shortening of the second and third metatarsals was noted.  Hardware is appropriately positioned and of the appropriate lengths.  No other acute injuries are noted.    Mechele Claude PA-C was present and scrubbed for the duration of the operative case. His assistance was essential in positioning the patient, prepping and draping, gaining and maintaining exposure, performing the operation, closing and dressing the wounds and applying the splint.

## 2020-12-09 NOTE — Transfer of Care (Signed)
Immediate Anesthesia Transfer of Care Note  Patient: Denise [name]  Procedure(s) Performed: Left second metatarsal phalangeal joint debridement, 2-3 weil osteotomies (Left Toe)  Patient Location: PACU  Anesthesia Type:General  Level of Consciousness: drowsy and patient cooperative  Airway & Oxygen Therapy: Patient Spontanous Breathing and Patient connected to face mask oxygen  Post-op Assessment: Report given to RN and Post -op Vital signs reviewed and stable  Post vital signs: Reviewed and stable  Last Vitals:  Vitals Value Taken Time  BP    Temp    Pulse 81 12/09/20 0813  Resp 17 12/09/20 0813  SpO2 100 % 12/09/20 0813  Vitals shown include unvalidated device data.  Last Pain:  Vitals:   12/09/20 0647  TempSrc: Oral  PainSc: 0-No pain         Complications: No complications documented.

## 2020-12-09 NOTE — Progress Notes (Signed)
Assisted Dr. Miller with left, ultrasound guided, popliteal block. Side rails up, monitors on throughout procedure. See vital signs in flow sheet. Tolerated Procedure well. 

## 2020-12-10 ENCOUNTER — Encounter (HOSPITAL_BASED_OUTPATIENT_CLINIC_OR_DEPARTMENT_OTHER): Payer: Self-pay | Admitting: Orthopedic Surgery

## 2020-12-10 LAB — SURGICAL PATHOLOGY

## 2020-12-18 ENCOUNTER — Encounter: Payer: Self-pay | Admitting: Family Medicine

## 2021-01-09 ENCOUNTER — Other Ambulatory Visit: Payer: Self-pay | Admitting: Family Medicine

## 2021-01-21 DIAGNOSIS — Z4889 Encounter for other specified surgical aftercare: Secondary | ICD-10-CM | POA: Diagnosis not present

## 2021-01-21 DIAGNOSIS — M7742 Metatarsalgia, left foot: Secondary | ICD-10-CM | POA: Diagnosis not present

## 2021-01-21 DIAGNOSIS — M79672 Pain in left foot: Secondary | ICD-10-CM | POA: Diagnosis not present

## 2021-03-07 DIAGNOSIS — Z4789 Encounter for other orthopedic aftercare: Secondary | ICD-10-CM | POA: Diagnosis not present

## 2021-03-07 DIAGNOSIS — M79672 Pain in left foot: Secondary | ICD-10-CM | POA: Diagnosis not present

## 2021-03-11 ENCOUNTER — Other Ambulatory Visit: Payer: Self-pay

## 2021-03-11 ENCOUNTER — Ambulatory Visit (INDEPENDENT_AMBULATORY_CARE_PROVIDER_SITE_OTHER): Payer: Medicare Other | Admitting: Family Medicine

## 2021-03-11 VITALS — BP 124/72 | HR 72 | Temp 97.3°F | Resp 16 | Ht 62.0 in | Wt 156.0 lb

## 2021-03-11 DIAGNOSIS — Z6828 Body mass index (BMI) 28.0-28.9, adult: Secondary | ICD-10-CM

## 2021-03-11 DIAGNOSIS — E785 Hyperlipidemia, unspecified: Secondary | ICD-10-CM

## 2021-03-11 DIAGNOSIS — Z1211 Encounter for screening for malignant neoplasm of colon: Secondary | ICD-10-CM | POA: Diagnosis not present

## 2021-03-11 DIAGNOSIS — E1169 Type 2 diabetes mellitus with other specified complication: Secondary | ICD-10-CM | POA: Diagnosis not present

## 2021-03-11 DIAGNOSIS — I10 Essential (primary) hypertension: Secondary | ICD-10-CM

## 2021-03-11 DIAGNOSIS — I700 Atherosclerosis of aorta: Secondary | ICD-10-CM | POA: Diagnosis not present

## 2021-03-11 DIAGNOSIS — E782 Mixed hyperlipidemia: Secondary | ICD-10-CM | POA: Diagnosis not present

## 2021-03-11 MED ORDER — CELECOXIB 200 MG PO CAPS: ORAL_CAPSULE | ORAL | 3 refills | Status: DC

## 2021-03-11 MED ORDER — TELMISARTAN 80 MG PO TABS: 80.0000 mg | ORAL_TABLET | Freq: Every day | ORAL | 3 refills | Status: DC

## 2021-03-11 NOTE — Progress Notes (Signed)
Established Patient Office Visit  Subjective:  Patient ID: Denise [name], female    DOB: Oct 05, 1945  Age: 75 y.o. MRN: [id number]  CC:  Chief Complaint  Patient presents with   Diabetes   Hypertension   Hyperlipidemia    HPI Denise [name] presents for follow-up of diabetes, hypertension, and hyperlipidemia. She does report that she has noticed a slight vision change but has an eye appointment next month. She feels she needs a new prescription for her glasses.   Dyslipidemia associated with type 2 diabetes mellitus ([place]) - Attempting to eat healthy but does report she does not exercise. Diet controlled. On no medicines.   Essential hypertension, benign  - Attempting to eat healthy - Taking Amlodipine 5 mg daily and Telmisartan 80 mg daily  Mixed hyperlipidemia  - Attempting to eat healthy. Not exercising - Taking Fenofibrate 160 mg daily  Past Medical History:  Diagnosis Date   Arthritis    Constipation    Diabetes mellitus without complication ([place])    diet controlled   HTN (hypertension) 06/15/2015   Pneumonia     Past Surgical History:  Procedure Laterality Date   ANKLE SURGERY     BREAST LUMPECTOMY     5 times   CARPAL TUNNEL RELEASE     10/2015   CATARACT EXTRACTION     CHOLECYSTECTOMY     METATARSAL OSTEOTOMY Left 12/09/2020   Procedure: Left second metatarsal phalangeal joint debridement, 2-3 weil osteotomies;  Surgeon: Wylene Simmer, MD;  Location: [place];  Service: Orthopedics;  Laterality: Left;  60 mins MAC regional vs General   NASAL SINUS SURGERY     right knee arthroscopic     ROTATOR CUFF REPAIR     TOTAL KNEE ARTHROPLASTY Right 03/28/2017   Procedure: RIGHT TOTAL KNEE ARTHROPLASTY;  Surgeon: Latanya Maudlin, MD;  Location: [facility];  Service: Orthopedics;  Laterality: Right;   TUBAL LIGATION      Family History  Problem Relation Age of Onset   Liver disease Father    Cirrhosis Father    Bone cancer Mother    Breast  cancer Other        aunts   Colon cancer Neg Hx     Social History   Socioeconomic History   Marital status: Married    Spouse name: Not on file   Number of children: Not on file   Years of education: Not on file   Highest education level: Not on file  Occupational History   Occupation: Self Employed  Tobacco Use   Smoking status: Never   Smokeless tobacco: Never  Vaping Use   Vaping Use: Never used  Substance and Sexual Activity   Alcohol use: No   Drug use: No   Sexual activity: Yes  Other Topics Concern   Not on file  Social History Narrative   Not on file   Social Determinants of Health   Financial Resource Strain: Not on file  Food Insecurity: Not on file  Transportation Needs: Not on file  Physical Activity: Not on file  Stress: Not on file  Social Connections: Not on file  Intimate Partner Violence: Not on file    Outpatient Medications Prior to Visit  Medication Sig Dispense Refill   amLODipine (NORVASC) 5 MG tablet Take 1 tablet (5 mg total) by mouth daily. 90 tablet 1   Calcium Carb-Cholecalciferol (CALCIUM 600 + D PO) Take 1 tablet by mouth daily.     fenofibrate 160 MG  tablet Take 1 tablet (160 mg total) by mouth daily. 90 tablet 1   mometasone (ELOCON) 0.1 % cream mometasone 0.1 % topical cream  APPLY SPARINGLY TO AFFECTED AREA EVERY DAY     Multiple Vitamin (MULTIVITAMIN) tablet Take 1 tablet by mouth daily.     vitamin C (ASCORBIC ACID) 500 MG tablet Take 500 mg by mouth daily.     celecoxib (CELEBREX) 200 MG capsule TAKE 1 CAPSULE BY MOUTH EVERY DAY 90 capsule 0   docusate sodium (COLACE) 100 MG capsule Take 1 capsule (100 mg total) by mouth 2 (two) times daily. While taking narcotic pain medicine. 30 capsule 0   senna (SENOKOT) 8.6 MG TABS tablet Take 2 tablets (17.2 mg total) by mouth 2 (two) times daily. 30 tablet 0   telmisartan (MICARDIS) 80 MG tablet TAKE 1 TABLET BY MOUTH EVERY DAY 90 tablet 0   No facility-administered medications prior to  visit.    Allergies  Allergen Reactions   Lisinopril Other (See Comments)    cough    ROS Review of Systems  Constitutional:  Negative for chills and fatigue.  HENT:  Negative for congestion and sore throat.   Eyes:  Positive for visual disturbance (slightly decreased vision).  Respiratory:  Positive for cough (at times) and shortness of breath (at times with exertion).   Cardiovascular:  Negative for chest pain, palpitations and leg swelling.  Gastrointestinal:  Negative for abdominal pain, constipation, diarrhea, nausea and vomiting.  Endocrine: Positive for polyuria. Negative for cold intolerance, heat intolerance and polyphagia.  Genitourinary:  Negative for difficulty urinating, frequency and urgency.  Neurological:  Negative for dizziness, weakness and numbness.  Psychiatric/Behavioral:  The patient is not nervous/anxious.      Objective:    Physical Exam Vitals reviewed.  Constitutional:      Appearance: Normal appearance.  HENT:     Head: Normocephalic.     Right Ear: Tympanic membrane, ear canal and external ear normal.     Left Ear: Tympanic membrane, ear canal and external ear normal.     Nose: Nose normal.  Neck:     Vascular: No carotid bruit.  Cardiovascular:     Rate and Rhythm: Normal rate and regular rhythm.     Pulses: Normal pulses.     Heart sounds: Normal heart sounds.  Pulmonary:     Effort: Pulmonary effort is normal.     Breath sounds: Normal breath sounds.  Abdominal:     General: Abdomen is flat. Bowel sounds are normal.     Palpations: Abdomen is soft.  Skin:    General: Skin is warm and dry.  Neurological:     General: No focal deficit present.     Mental Status: She is alert and oriented to person, place, and time.  Psychiatric:        Mood and Affect: Mood normal.        Behavior: Behavior normal.   Diabetic Foot Exam - Simple   Simple Foot Form Visual Inspection No deformities, no ulcerations, no other skin breakdown  bilaterally: Yes Sensation Testing Intact to touch and monofilament testing bilaterally: Yes Pulse Check Posterior Tibialis and Dorsalis pulse intact bilaterally: Yes Comments      BP 124/72   Pulse 72   Temp (!) 97.3 F (36.3 C)   Resp 16   Ht _0  (1.575 m)   Wt 156 lb (70.8 kg)   BMI 28.53 kg/m  Wt Readings from Last 3 Encounters:  03/11/21 156  lb (70.8 kg)  12/09/20 162 lb 4.1 oz (73.6 kg)  12/06/20 158 lb (71.7 kg)     Health Maintenance Due  Topic Date Due   OPHTHALMOLOGY EXAM  Never done   Hepatitis C Screening  Never done   TETANUS/TDAP  Never done   PNA vac Low Risk Adult (2 of 2 - PPSV23) 03/15/2016   Zoster Vaccines- Shingrix (2 of 2) 05/07/2017   COLONOSCOPY (Pts 45-90yr Insurance coverage will need to be confirmed)  01/16/2020   INFLUENZA VACCINE  03/07/2021    There are no preventive care reminders to display for this patient.  Lab Results  Component Value Date   TSH 2.000 08/17/2020   Lab Results  Component Value Date   WBC 5.6 12/06/2020   HGB 12.2 12/06/2020   HCT 38.5 12/06/2020   MCV 90 12/06/2020   PLT 301 12/06/2020   Lab Results  Component Value Date   NA 142 12/06/2020   K 4.6 12/06/2020   CO2 21 12/06/2020   GLUCOSE 109 (H) 12/06/2020   BUN 28 (H) 12/06/2020   CREATININE 1.20 (H) 12/06/2020   BILITOT 0.3 12/06/2020   ALKPHOS 65 12/06/2020   AST 25 12/06/2020   ALT 15 12/06/2020   PROT 6.4 12/06/2020   ALBUMIN 4.6 12/06/2020   CALCIUM 9.5 12/06/2020   ANIONGAP 3 (L) 03/30/2017   EGFR 47 (L) 12/06/2020   GFR 87.62 05/17/2011   Lab Results  Component Value Date   CHOL 160 12/06/2020   Lab Results  Component Value Date   HDL 45 12/06/2020   Lab Results  Component Value Date   LDLCALC 93 12/06/2020   Lab Results  Component Value Date   TRIG 121 12/06/2020   Lab Results  Component Value Date   CHOLHDL 3.6 12/06/2020   Lab Results  Component Value Date   HGBA1C 6.2 (H) 12/06/2020      Assessment & Plan:   1. Dyslipidemia associated with type 2 diabetes mellitus ([place] - At goal - Continue healthy diet - Hemoglobin A1c  2. Essential hypertension, benign - Controlled - Continue healthy diet - Continue Norvasc 5 mg po daily - Continue Telmisartan 80 mg po daily - CBC with Differential/Platelet - Comprehensive metabolic panel  3. Mixed hyperlipidemia - At goal - Continue healthy diet - Continue Fenofibrate 160 mg po daily - Lipid panel   4. BMI 28 overweight The current medical regimen is effective;  continue present plan and medications.  5. Aortic atherosclerosis (identified on CT chest in 2020) - lipid panel.  On fenofibrate. Statin is recommended.  Discussion via nurse based on lab work.   6. Colon cancer screening  - pt refused. We discussed risks/benefits and she refused colonoscopy, cologuard, or hemoccult cards AMA  Meds ordered this encounter  Medications   celecoxib (CELEBREX) 200 MG capsule    Sig: TAKE 1 CAPSULE BY MOUTH EVERY DAY    Dispense:  90 capsule    Refill:  3   telmisartan (MICARDIS) 80 MG tablet    Sig: Take 1 tablet (80 mg total) by mouth daily.    Dispense:  90 tablet    Refill:  3    Follow-up: Return in about 3 months (around 06/11/2021) for fasting.    JGayleen Orem RN

## 2021-03-12 LAB — COMPREHENSIVE METABOLIC PANEL
ALT: 11 IU/L (ref 0–32)
AST: 20 IU/L (ref 0–40)
Albumin/Globulin Ratio: 2 (ref 1.2–2.2)
Albumin: 4.5 g/dL (ref 3.7–4.7)
Alkaline Phosphatase: 69 IU/L (ref 44–121)
BUN/Creatinine Ratio: 20 (ref 12–28)
BUN: 24 mg/dL (ref 8–27)
Bilirubin Total: 0.5 mg/dL (ref 0.0–1.2)
CO2: 22 mmol/L (ref 20–29)
Calcium: 10.3 mg/dL (ref 8.7–10.3)
Chloride: 103 mmol/L (ref 96–106)
Creatinine, Ser: 1.23 mg/dL — ABNORMAL HIGH (ref 0.57–1.00)
Globulin, Total: 2.3 g/dL (ref 1.5–4.5)
Glucose: 102 mg/dL — ABNORMAL HIGH (ref 65–99)
Potassium: 4.8 mmol/L (ref 3.5–5.2)
Sodium: 141 mmol/L (ref 134–144)
Total Protein: 6.8 g/dL (ref 6.0–8.5)
eGFR: 46 mL/min/{1.73_m2} — ABNORMAL LOW (ref >59)

## 2021-03-12 LAB — CBC WITH DIFFERENTIAL/PLATELET
Basophils Absolute: 0.1 10*3/uL (ref 0.0–0.2)
Basos: 1 %
EOS (ABSOLUTE): 0.2 10*3/uL (ref 0.0–0.4)
Eos: 3 %
Hematocrit: 39.4 % (ref 34.0–46.6)
Hemoglobin: 13 g/dL (ref 11.1–15.9)
Immature Grans (Abs): 0 10*3/uL (ref 0.0–0.1)
Immature Granulocytes: 0 %
Lymphocytes Absolute: 1.6 10*3/uL (ref 0.7–3.1)
Lymphs: 28 %
MCH: 29.3 pg (ref 26.6–33.0)
MCHC: 33 g/dL (ref 31.5–35.7)
MCV: 89 fL (ref 79–97)
Monocytes Absolute: 0.4 10*3/uL (ref 0.1–0.9)
Monocytes: 7 %
Neutrophils Absolute: 3.6 10*3/uL (ref 1.4–7.0)
Neutrophils: 61 %
Platelets: 291 10*3/uL (ref 150–450)
RBC: 4.44 x10E6/uL (ref 3.77–5.28)
RDW: 12.4 % (ref 11.7–15.4)
WBC: 5.8 10*3/uL (ref 3.4–10.8)

## 2021-03-12 LAB — LIPID PANEL
Chol/HDL Ratio: 3.1 ratio (ref 0.0–4.4)
Cholesterol, Total: 160 mg/dL (ref 100–199)
HDL: 52 mg/dL (ref >39)
LDL Chol Calc (NIH): 87 mg/dL (ref 0–99)
Triglycerides: 117 mg/dL (ref 0–149)
VLDL Cholesterol Cal: 21 mg/dL (ref 5–40)

## 2021-03-12 LAB — HEMOGLOBIN A1C
Est. average glucose Bld gHb Est-mCnc: 131 mg/dL
Hgb A1c MFr Bld: 6.2 % — ABNORMAL HIGH (ref 4.8–5.6)

## 2021-03-12 LAB — CARDIOVASCULAR RISK ASSESSMENT

## 2021-03-13 ENCOUNTER — Encounter: Payer: Self-pay | Admitting: Family Medicine

## 2021-03-14 ENCOUNTER — Other Ambulatory Visit: Payer: Self-pay

## 2021-03-14 DIAGNOSIS — N289 Disorder of kidney and ureter, unspecified: Secondary | ICD-10-CM

## 2021-03-14 MED ORDER — ROSUVASTATIN CALCIUM 5 MG PO TABS: 5.0000 mg | ORAL_TABLET | Freq: Every day | ORAL | 2 refills | Status: DC

## 2021-03-28 ENCOUNTER — Other Ambulatory Visit: Payer: Medicare Other

## 2021-03-28 ENCOUNTER — Other Ambulatory Visit: Payer: Self-pay

## 2021-03-28 DIAGNOSIS — N289 Disorder of kidney and ureter, unspecified: Secondary | ICD-10-CM | POA: Diagnosis not present

## 2021-03-29 LAB — COMPREHENSIVE METABOLIC PANEL
ALT: 10 IU/L (ref 0–32)
AST: 23 IU/L (ref 0–40)
Albumin/Globulin Ratio: 2.1 (ref 1.2–2.2)
Albumin: 4.5 g/dL (ref 3.7–4.7)
Alkaline Phosphatase: 65 IU/L (ref 44–121)
BUN/Creatinine Ratio: 18 (ref 12–28)
BUN: 20 mg/dL (ref 8–27)
Bilirubin Total: 0.4 mg/dL (ref 0.0–1.2)
CO2: 22 mmol/L (ref 20–29)
Calcium: 9.8 mg/dL (ref 8.7–10.3)
Chloride: 102 mmol/L (ref 96–106)
Creatinine, Ser: 1.1 mg/dL — ABNORMAL HIGH (ref 0.57–1.00)
Globulin, Total: 2.1 g/dL (ref 1.5–4.5)
Glucose: 112 mg/dL — ABNORMAL HIGH (ref 65–99)
Potassium: 4.1 mmol/L (ref 3.5–5.2)
Sodium: 141 mmol/L (ref 134–144)
Total Protein: 6.6 g/dL (ref 6.0–8.5)
eGFR: 52 mL/min/{1.73_m2} — ABNORMAL LOW (ref >59)

## 2021-03-31 ENCOUNTER — Telehealth: Payer: Self-pay

## 2021-03-31 NOTE — Telephone Encounter (Signed)
Patient called questioning rather or not she could start taking her celebrex again (kidney function improved since having patient hold medication). Informed patient that she can not restart medication (Per Dr. Tobie Poet) and advised patient that she can use/try otc Voltaren Gel 3-4 times daily. Patient states she hurts all over so using a gel does not seem feasible. Please advise.

## 2021-04-01 ENCOUNTER — Other Ambulatory Visit: Payer: Self-pay | Admitting: Family Medicine

## 2021-04-01 MED ORDER — TRAMADOL HCL 50 MG PO TABS: 50.0000 mg | ORAL_TABLET | Freq: Two times a day (BID) | ORAL | 0 refills | Status: AC | PRN

## 2021-04-02 ENCOUNTER — Encounter: Payer: Self-pay | Admitting: Family Medicine

## 2021-04-06 ENCOUNTER — Telehealth: Payer: Self-pay

## 2021-04-06 DIAGNOSIS — M19041 Primary osteoarthritis, right hand: Secondary | ICD-10-CM

## 2021-04-06 MED ORDER — CELECOXIB 100 MG PO CAPS: 100.0000 mg | ORAL_CAPSULE | Freq: Every day | ORAL | 0 refills | Status: DC

## 2021-04-06 NOTE — Telephone Encounter (Signed)
Pt called asking status of celebrex 100 mg. Made pt aware would send in new script to [facility] on [address]. Pt VU. Ok'd by Dr Tobie Poet.   Royce Macadamia, [place] 04/06/21 9:04 AM

## 2021-04-07 ENCOUNTER — Other Ambulatory Visit: Payer: Self-pay | Admitting: Family Medicine

## 2021-04-17 ENCOUNTER — Other Ambulatory Visit: Payer: Self-pay | Admitting: Family Medicine

## 2021-04-17 DIAGNOSIS — I10 Essential (primary) hypertension: Secondary | ICD-10-CM

## 2021-04-20 DIAGNOSIS — H26491 Other secondary cataract, right eye: Secondary | ICD-10-CM | POA: Diagnosis not present

## 2021-04-25 ENCOUNTER — Other Ambulatory Visit: Payer: Self-pay

## 2021-04-25 ENCOUNTER — Ambulatory Visit
Admission: RE | Admit: 2021-04-25 | Discharge: 2021-04-25 | Disposition: A | Discharge status: Home / Self Care | Payer: Medicare Other | Source: Ambulatory Visit | Attending: Obstetrics & Gynecology | Admitting: Obstetrics & Gynecology

## 2021-04-25 DIAGNOSIS — N6320 Unspecified lump in the left breast, unspecified quadrant: Secondary | ICD-10-CM

## 2021-04-25 DIAGNOSIS — R922 Inconclusive mammogram: Secondary | ICD-10-CM | POA: Diagnosis not present

## 2021-05-03 ENCOUNTER — Other Ambulatory Visit: Payer: Self-pay | Admitting: Family Medicine

## 2021-05-03 DIAGNOSIS — M19041 Primary osteoarthritis, right hand: Secondary | ICD-10-CM

## 2021-05-06 ENCOUNTER — Encounter: Payer: Self-pay | Admitting: Family Medicine

## 2021-05-06 ENCOUNTER — Other Ambulatory Visit (INDEPENDENT_AMBULATORY_CARE_PROVIDER_SITE_OTHER): Payer: Medicare Other

## 2021-05-06 ENCOUNTER — Other Ambulatory Visit: Payer: Self-pay

## 2021-05-06 DIAGNOSIS — N289 Disorder of kidney and ureter, unspecified: Secondary | ICD-10-CM

## 2021-05-06 DIAGNOSIS — Z23 Encounter for immunization: Secondary | ICD-10-CM | POA: Diagnosis not present

## 2021-05-07 LAB — COMPREHENSIVE METABOLIC PANEL
ALT: 10 IU/L (ref 0–32)
AST: 21 IU/L (ref 0–40)
Albumin/Globulin Ratio: 2.9 — ABNORMAL HIGH (ref 1.2–2.2)
Albumin: 4.7 g/dL (ref 3.7–4.7)
Alkaline Phosphatase: 65 IU/L (ref 44–121)
BUN/Creatinine Ratio: 18 (ref 12–28)
BUN: 19 mg/dL (ref 8–27)
Bilirubin Total: 0.4 mg/dL (ref 0.0–1.2)
CO2: 21 mmol/L (ref 20–29)
Calcium: 9.7 mg/dL (ref 8.7–10.3)
Chloride: 102 mmol/L (ref 96–106)
Creatinine, Ser: 1.07 mg/dL — ABNORMAL HIGH (ref 0.57–1.00)
Globulin, Total: 1.6 g/dL (ref 1.5–4.5)
Glucose: 107 mg/dL — ABNORMAL HIGH (ref 70–99)
Potassium: 4.4 mmol/L (ref 3.5–5.2)
Sodium: 139 mmol/L (ref 134–144)
Total Protein: 6.3 g/dL (ref 6.0–8.5)
eGFR: 54 mL/min/{1.73_m2} — ABNORMAL LOW (ref >59)

## 2021-05-09 ENCOUNTER — Other Ambulatory Visit: Payer: Self-pay

## 2021-05-09 DIAGNOSIS — M19041 Primary osteoarthritis, right hand: Secondary | ICD-10-CM

## 2021-05-09 MED ORDER — ROSUVASTATIN CALCIUM 5 MG PO TABS: 5.0000 mg | ORAL_TABLET | Freq: Every day | ORAL | 1 refills | Status: DC

## 2021-05-09 MED ORDER — CELECOXIB 100 MG PO CAPS: ORAL_CAPSULE | ORAL | 0 refills | Status: DC

## 2021-05-12 ENCOUNTER — Other Ambulatory Visit: Payer: Self-pay | Admitting: General Surgery

## 2021-06-01 ENCOUNTER — Other Ambulatory Visit: Payer: Self-pay | Admitting: Family Medicine

## 2021-06-01 DIAGNOSIS — M19041 Primary osteoarthritis, right hand: Secondary | ICD-10-CM

## 2021-06-14 ENCOUNTER — Ambulatory Visit (INDEPENDENT_AMBULATORY_CARE_PROVIDER_SITE_OTHER): Payer: Medicare Other | Admitting: Family Medicine

## 2021-06-14 ENCOUNTER — Other Ambulatory Visit: Payer: Self-pay

## 2021-06-14 ENCOUNTER — Encounter: Payer: Self-pay | Admitting: Family Medicine

## 2021-06-14 ENCOUNTER — Ambulatory Visit (INDEPENDENT_AMBULATORY_CARE_PROVIDER_SITE_OTHER): Payer: Medicare Other

## 2021-06-14 VITALS — BP 122/62 | HR 68 | Temp 97.3°F | Ht 62.0 in | Wt 152.0 lb

## 2021-06-14 DIAGNOSIS — E785 Hyperlipidemia, unspecified: Secondary | ICD-10-CM | POA: Diagnosis not present

## 2021-06-14 DIAGNOSIS — I700 Atherosclerosis of aorta: Secondary | ICD-10-CM | POA: Diagnosis not present

## 2021-06-14 DIAGNOSIS — I10 Essential (primary) hypertension: Secondary | ICD-10-CM

## 2021-06-14 DIAGNOSIS — E1159 Type 2 diabetes mellitus with other circulatory complications: Secondary | ICD-10-CM

## 2021-06-14 DIAGNOSIS — N1831 Chronic kidney disease, stage 3a: Secondary | ICD-10-CM | POA: Diagnosis not present

## 2021-06-14 DIAGNOSIS — E782 Mixed hyperlipidemia: Secondary | ICD-10-CM | POA: Diagnosis not present

## 2021-06-14 DIAGNOSIS — I152 Hypertension secondary to endocrine disorders: Secondary | ICD-10-CM | POA: Diagnosis not present

## 2021-06-14 DIAGNOSIS — E1169 Type 2 diabetes mellitus with other specified complication: Secondary | ICD-10-CM | POA: Diagnosis not present

## 2021-06-14 DIAGNOSIS — Z23 Encounter for immunization: Secondary | ICD-10-CM | POA: Diagnosis not present

## 2021-06-14 DIAGNOSIS — M19041 Primary osteoarthritis, right hand: Secondary | ICD-10-CM

## 2021-06-14 NOTE — Progress Notes (Signed)
Subjective:  Patient ID: Denise [name], female    DOB: Mar 01, 1946  Age: 75 y.o. MRN: [id number]  Chief Complaint  Patient presents with   Hypertension   Hyperlipidemia   Diabetes    Diabetes:  Complications: hyperlipidemia, hypertension Glucose checking: not checking Glucose logs: No Hypoglycemia:No Most recent A1C: 03/11/2021 results: 6.2 Current medications: None- Patient is diet controlled.  Hyperlipidemia: Current medications: Fenofibrate 167m 1 tablet once daily, rosuvastatin 5 mg once daily.   Hypertension: Current medications: Amlodipine 579m1 tablet daily, Telmisartan 8084m tablet daily.  Diet:fairly healthy Exercise: walking some    Current Outpatient Medications on File Prior to Visit  Medication Sig Dispense Refill   amLODipine (NORVASC) 5 MG tablet TAKE 1 TABLET (5 MG TOTAL) BY MOUTH DAILY. 90 tablet 1   Calcium Carb-Cholecalciferol (CALCIUM 600 + D PO) Take 1 tablet by mouth daily.     celecoxib (CELEBREX) 100 MG capsule TAKE 1 CAPSULE BY MOUTH EVERY DAY 90 capsule 1   fenofibrate 160 MG tablet Take 1 tablet (160 mg total) by mouth daily. 90 tablet 1   mometasone (ELOCON) 0.1 % cream mometasone 0.1 % topical cream  APPLY SPARINGLY TO AFFECTED AREA EVERY DAY     Multiple Vitamin (MULTIVITAMIN) tablet Take 1 tablet by mouth daily.     rosuvastatin (CRESTOR) 5 MG tablet Take 1 tablet (5 mg total) by mouth daily. 90 tablet 1   telmisartan (MICARDIS) 80 MG tablet Take 1 tablet (80 mg total) by mouth daily. 90 tablet 3   vitamin C (ASCORBIC ACID) 500 MG tablet Take 500 mg by mouth daily.     No current facility-administered medications on file prior to visit.   Past Medical History:  Diagnosis Date   Aortic atherosclerosis ([facility]   Arthritis    Constipation    Diabetes mellitus without complication ([facility]CC[facility]  diet controlled   HTN (hypertension) 06/15/2015   Pneumonia    Past Surgical History:  Procedure Laterality Date   ANKLE SURGERY     BREAST  LUMPECTOMY     5 times   CARPAL TUNNEL RELEASE     10/2015   CATARACT EXTRACTION     CHOLECYSTECTOMY     METATARSAL OSTEOTOMY Left 12/09/2020   Procedure: Left second metatarsal phalangeal joint debridement, 2-3 weil osteotomies;  Surgeon: HewWylene SimmerD;  [facility];  Laterality: Left;  60 mins [facility] vs General   NASAL SINUS SURGERY     right knee arthroscopic     ROTATOR CUFF REPAIR     TOTAL KNEE ARTHROPLASTY Right 03/28/2017   Procedure: RIGHT TOTAL KNEE ARTHROPLASTY;  Surgeon: GioLatanya MaudlinD;  [facility];  Service: Orthopedics;  Laterality: Right;   TUBAL LIGATION      Family History  Problem Relation Age of Onset   Liver disease Father    Cirrhosis Father    Bone cancer Mother    Breast cancer Other        aunts   Colon cancer Neg Hx    Social History   Socioeconomic History   Marital status: Married    Spouse name: Not on file   Number of children: Not on file   Years of education: Not on file   Highest education level: Not on file  Occupational History   Occupation: Self Employed  Tobacco Use   Smoking status: Never   Smokeless tobacco: Never  Vaping Use   Vaping Use: Never  used  Substance and Sexual Activity   Alcohol use: No   Drug use: No   Sexual activity: Yes  Other Topics Concern   Not on file  Social History Narrative   Not on file   Social Determinants of Health   Financial Resource Strain: Not on file  Food Insecurity: Not on file  Transportation Needs: Not on file  Physical Activity: Not on file  Stress: Not on file  Social Connections: Not on file    Review of Systems  Constitutional:  Negative for appetite change, fatigue and fever.  HENT:  Negative for congestion, ear pain, sinus pressure and sore throat.   Eyes:  Positive for visual disturbance (Last eye appointment- Newly diagnosed with Macular Degeneration). Negative for pain.  Respiratory:  Positive for shortness of  breath. Negative for cough, chest tightness and wheezing.   Cardiovascular:  Negative for chest pain and palpitations.  Gastrointestinal:  Negative for abdominal pain, constipation, diarrhea, nausea and vomiting.  Genitourinary:  Negative for dysuria and hematuria.  Musculoskeletal:  Positive for back pain. Negative for arthralgias, joint swelling and myalgias.  Skin:  Negative for rash.  Neurological:  Negative for dizziness, weakness and headaches.  Psychiatric/Behavioral:  Negative for dysphoric mood. The patient is not nervous/anxious.     Objective:  BP 122/62 (BP Location: Right Arm, Patient Position: Sitting)   Pulse 68   Temp (!) 97.3 F (36.3 C) (Temporal)   Ht _0  (1.575 m)   Wt 152 lb (68.9 kg)   SpO2 97%   BMI 27.80 kg/m   BP/Weight 06/14/2021 03/11/5363 01/13/320  Systolic BP 224 825 003  Diastolic BP 62 72 59  Wt. (Lbs) 152 156 162.26  BMI 27.8 28.53 29.68    Physical Exam Vitals reviewed.  Constitutional:      Appearance: Normal appearance. She is normal weight.  Neck:     Vascular: No carotid bruit.  Cardiovascular:     Rate and Rhythm: Normal rate and regular rhythm.     Pulses: Normal pulses.     Heart sounds: Normal heart sounds.  Pulmonary:     Effort: Pulmonary effort is normal. No respiratory distress.     Breath sounds: Normal breath sounds.  Abdominal:     General: Abdomen is flat. Bowel sounds are normal.     Palpations: Abdomen is soft.     Tenderness: There is no abdominal tenderness.  Neurological:     Mental Status: She is alert and oriented to person, place, and time.  Psychiatric:        Mood and Affect: Mood normal.        Behavior: Behavior normal.    Diabetic Foot Exam - Simple   Simple Foot Form Diabetic Foot exam was performed with the following findings: Yes 06/14/2021 10:20 AM  Visual Inspection No deformities, no ulcerations, no other skin breakdown bilaterally: Yes Sensation Testing Intact to touch and monofilament testing  bilaterally: Yes Pulse Check Posterior Tibialis and Dorsalis pulse intact bilaterally: Yes Comments      Lab Results  Component Value Date   WBC 6.7 06/14/2021   HGB 12.7 06/14/2021   HCT 37.3 06/14/2021   PLT 273 06/14/2021   GLUCOSE 99 06/14/2021   CHOL 137 06/14/2021   TRIG 100 06/14/2021   HDL 50 06/14/2021   LDLCALC 68 06/14/2021   ALT 8 06/14/2021   AST 18 06/14/2021   NA 139 06/14/2021   K 4.6 06/14/2021   CL 103 06/14/2021   CREATININE  1.23 (H) 06/14/2021   BUN 29 (H) 06/14/2021   CO2 23 06/14/2021   TSH 2.000 08/17/2020   INR 0.94 03/20/2017   HGBA1C 5.9 (H) 06/14/2021   MICROALBUR 30 12/06/2020      Assessment & Plan:   Problem List Items Addressed This Visit       Cardiovascular and Mediastinum   Hypertension associated with diabetes ([place])    The current medical regimen is effective;  continue present plan and medications. Continue Amlodipine 45m 1 tablet daily, Telmisartan 817m1 tablet daily.      Aortic atherosclerosis (HCC)    The current medical regimen is effective;  continue present plan and medications. Continue Fenofibrate 16085m tablet once daily, rosuvastatin 5 mg once daily.         Endocrine   Dyslipidemia associated with type 2 diabetes mellitus ([facility]    Control: good Recommend check feet daily. Recommend annual eye exams. Continue to work on eating a healthy diet and exercise.         Relevant Orders   CBC with Differential/Platelet (Completed)   Comprehensive metabolic panel (Completed)   Hemoglobin A1c (Completed)   Lipid panel (Completed)     Musculoskeletal and Integument   Arthritis of right hand    Doing well on celebrex 100 mg once daily.         Genitourinary   Stage 3a chronic kidney disease (HCC[place]  If worsens, will stop celebrex.  Quality of life worsened significantly when I stopped the celebrex, so I restarted on lesser dose.  Monitor gfr. If stable, remain on current dose         Other    Mixed hyperlipidemia    The current medical regimen is effective;  continue present plan and medications. Continue Fenofibrate 160m30mtablet once daily, rosuvastatin 5 mg once daily.  Recommend continue to work on eating healthy diet and exercise.      .  No orders of the defined types were placed in this encounter.   Orders Placed This Encounter  Procedures   CBC with Differential/Platelet   Comprehensive metabolic panel   Hemoglobin A1c   Lipid panel   Cardiovascular Risk Assessment      Follow-up: Return in about 6 months (around 12/12/2021) for chronic fasting.  An After Visit Summary was printed and given to the patient.  I,Lauren M Auman,acting as a scribe for KirsRochel Brome.,have documented all relevant documentation on the behalf of KirsRochel Brome,as directed by  KirsRochel Brome while in the presence of KirsRochel Brome.    KirsRochel Brome Ciara Kagan [phone]

## 2021-06-15 LAB — COMPREHENSIVE METABOLIC PANEL
ALT: 8 IU/L (ref 0–32)
AST: 18 IU/L (ref 0–40)
Albumin/Globulin Ratio: 2.2 (ref 1.2–2.2)
Albumin: 4.6 g/dL (ref 3.7–4.7)
Alkaline Phosphatase: 71 IU/L (ref 44–121)
BUN/Creatinine Ratio: 24 (ref 12–28)
BUN: 29 mg/dL — ABNORMAL HIGH (ref 8–27)
Bilirubin Total: 0.5 mg/dL (ref 0.0–1.2)
CO2: 23 mmol/L (ref 20–29)
Calcium: 9.9 mg/dL (ref 8.7–10.3)
Chloride: 103 mmol/L (ref 96–106)
Creatinine, Ser: 1.23 mg/dL — ABNORMAL HIGH (ref 0.57–1.00)
Globulin, Total: 2.1 g/dL (ref 1.5–4.5)
Glucose: 99 mg/dL (ref 70–99)
Potassium: 4.6 mmol/L (ref 3.5–5.2)
Sodium: 139 mmol/L (ref 134–144)
Total Protein: 6.7 g/dL (ref 6.0–8.5)
eGFR: 46 mL/min/{1.73_m2} — ABNORMAL LOW (ref >59)

## 2021-06-15 LAB — CBC WITH DIFFERENTIAL/PLATELET
Basophils Absolute: 0 10*3/uL (ref 0.0–0.2)
Basos: 1 %
EOS (ABSOLUTE): 0.1 10*3/uL (ref 0.0–0.4)
Eos: 2 %
Hematocrit: 37.3 % (ref 34.0–46.6)
Hemoglobin: 12.7 g/dL (ref 11.1–15.9)
Immature Grans (Abs): 0 10*3/uL (ref 0.0–0.1)
Immature Granulocytes: 0 %
Lymphocytes Absolute: 1.8 10*3/uL (ref 0.7–3.1)
Lymphs: 26 %
MCH: 30.1 pg (ref 26.6–33.0)
MCHC: 34 g/dL (ref 31.5–35.7)
MCV: 88 fL (ref 79–97)
Monocytes Absolute: 0.4 10*3/uL (ref 0.1–0.9)
Monocytes: 7 %
Neutrophils Absolute: 4.4 10*3/uL (ref 1.4–7.0)
Neutrophils: 64 %
Platelets: 273 10*3/uL (ref 150–450)
RBC: 4.22 x10E6/uL (ref 3.77–5.28)
RDW: 12 % (ref 11.7–15.4)
WBC: 6.7 10*3/uL (ref 3.4–10.8)

## 2021-06-15 LAB — LIPID PANEL
Chol/HDL Ratio: 2.7 ratio (ref 0.0–4.4)
Cholesterol, Total: 137 mg/dL (ref 100–199)
HDL: 50 mg/dL (ref >39)
LDL Chol Calc (NIH): 68 mg/dL (ref 0–99)
Triglycerides: 100 mg/dL (ref 0–149)
VLDL Cholesterol Cal: 19 mg/dL (ref 5–40)

## 2021-06-15 LAB — HEMOGLOBIN A1C
Est. average glucose Bld gHb Est-mCnc: 123 mg/dL
Hgb A1c MFr Bld: 5.9 % — ABNORMAL HIGH (ref 4.8–5.6)

## 2021-06-18 ENCOUNTER — Encounter: Payer: Self-pay | Admitting: Family Medicine

## 2021-06-19 DIAGNOSIS — N1831 Chronic kidney disease, stage 3a: Secondary | ICD-10-CM | POA: Insufficient documentation

## 2021-06-19 DIAGNOSIS — I700 Atherosclerosis of aorta: Secondary | ICD-10-CM | POA: Insufficient documentation

## 2021-06-19 NOTE — Assessment & Plan Note (Signed)
The current medical regimen is effective;  continue present plan and medications. Continue Fenofibrate 160mg  1 tablet once daily, rosuvastatin 5 mg once daily.  Recommend continue to work on eating healthy diet and exercise.

## 2021-06-19 NOTE — Assessment & Plan Note (Signed)
Control: good Recommend check feet daily. Recommend annual eye exams. Continue to work on eating a healthy diet and exercise.

## 2021-06-19 NOTE — Assessment & Plan Note (Signed)
If worsens, will stop celebrex.  Quality of life worsened significantly when I stopped the celebrex, so I restarted on lesser dose.  Monitor gfr. If stable, remain on current dose

## 2021-06-19 NOTE — Assessment & Plan Note (Signed)
The current medical regimen is effective;  continue present plan and medications. Continue Fenofibrate 160mg  1 tablet once daily, rosuvastatin 5 mg once daily.

## 2021-06-19 NOTE — Assessment & Plan Note (Signed)
The current medical regimen is effective;  continue present plan and medications. Continue Amlodipine 5mg  1 tablet daily, Telmisartan 80mg  1 tablet daily.

## 2021-06-19 NOTE — Assessment & Plan Note (Signed)
Doing well on celebrex 100 mg once daily.

## 2021-08-19 DIAGNOSIS — M25562 Pain in left knee: Secondary | ICD-10-CM | POA: Diagnosis not present

## 2021-08-25 ENCOUNTER — Other Ambulatory Visit: Payer: Self-pay | Admitting: Family Medicine

## 2021-08-25 DIAGNOSIS — E782 Mixed hyperlipidemia: Secondary | ICD-10-CM

## 2021-10-12 ENCOUNTER — Other Ambulatory Visit: Payer: Self-pay | Admitting: Family Medicine

## 2021-10-12 DIAGNOSIS — I10 Essential (primary) hypertension: Secondary | ICD-10-CM

## 2021-10-12 NOTE — Telephone Encounter (Signed)
Refill sent to pharmacy.   

## 2021-11-26 ENCOUNTER — Other Ambulatory Visit: Payer: Self-pay | Admitting: Family Medicine

## 2021-11-28 NOTE — Telephone Encounter (Signed)
Refill sent to pharmacy.   

## 2021-12-09 ENCOUNTER — Encounter: Payer: Self-pay | Admitting: Family Medicine

## 2021-12-09 ENCOUNTER — Ambulatory Visit (INDEPENDENT_AMBULATORY_CARE_PROVIDER_SITE_OTHER): Payer: Medicare Other | Admitting: Family Medicine

## 2021-12-09 VITALS — BP 122/72 | HR 70 | Temp 97.0°F | Ht 62.0 in | Wt 162.0 lb

## 2021-12-09 DIAGNOSIS — E1159 Type 2 diabetes mellitus with other circulatory complications: Secondary | ICD-10-CM

## 2021-12-09 DIAGNOSIS — I700 Atherosclerosis of aorta: Secondary | ICD-10-CM

## 2021-12-09 DIAGNOSIS — E1169 Type 2 diabetes mellitus with other specified complication: Secondary | ICD-10-CM

## 2021-12-09 DIAGNOSIS — M19041 Primary osteoarthritis, right hand: Secondary | ICD-10-CM

## 2021-12-09 DIAGNOSIS — I152 Hypertension secondary to endocrine disorders: Secondary | ICD-10-CM | POA: Diagnosis not present

## 2021-12-09 DIAGNOSIS — N1831 Chronic kidney disease, stage 3a: Secondary | ICD-10-CM

## 2021-12-09 DIAGNOSIS — E782 Mixed hyperlipidemia: Secondary | ICD-10-CM | POA: Diagnosis not present

## 2021-12-09 DIAGNOSIS — E785 Hyperlipidemia, unspecified: Secondary | ICD-10-CM | POA: Diagnosis not present

## 2021-12-09 NOTE — Assessment & Plan Note (Signed)
Well controlled.  ?No changes to medicines.  ?Continue to work on eating a healthy diet and exercise.  ?Labs drawn today.  ?

## 2021-12-09 NOTE — Assessment & Plan Note (Addendum)
Check labs. Decrease celebrex 100 mg once daily. ?

## 2021-12-09 NOTE — Progress Notes (Signed)
? ?Subjective:  ?Patient ID: Denise [name], female    DOB: 1946-03-16  Age: 76 y.o. MRN: [id number] ? ?Chief Complaint  ?Patient presents with  ? Diabetes  ? Hyperlipidemia  ? Hypertension  ? ?HPI" ? ?Diabetes:  ?Complications: hyperlipidemia, hypertension ?Glucose checking: not checking ?Glucose logs: No ?Hypoglycemia:No ?Most recent A1C: 5.9 ?Current medications: None- Patient is diet controlled. ?Checking feet daily.  ? ?Hyperlipidemia: ?Current medications: Fenofibrate 176m 1 tablet once daily, rosuvastatin 5 mg once daily. UTD. NO side effects.  ? ?Hypertension: ?Current medications: Amlodipine 559m1 tablet daily, Telmisartan 8071m tablet daily. UTD ? ?Ankle pain: celebrex helps. Has had foot and ankle surgery.  ? ?Diet:fairly healthy ?Exercise: still walking. ?  ?Current Outpatient Medications on File Prior to Visit  ?Medication Sig Dispense Refill  ? amLODipine (NORVASC) 5 MG tablet TAKE 1 TABLET (5 MG TOTAL) BY MOUTH DAILY. 90 tablet 1  ? Calcium Carb-Cholecalciferol (CALCIUM 600 + D PO) Take 1 tablet by mouth daily.    ? celecoxib (CELEBREX) 100 MG capsule TAKE 1 CAPSULE BY MOUTH EVERY DAY 90 capsule 1  ? fenofibrate 160 MG tablet TAKE 1 TABLET BY MOUTH EVERY DAY 90 tablet 1  ? mometasone (ELOCON) 0.1 % cream mometasone 0.1 % topical cream ? APPLY SPARINGLY TO AFFECTED AREA EVERY DAY    ? Multiple Vitamin (MULTIVITAMIN) tablet Take 1 tablet by mouth daily.    ? rosuvastatin (CRESTOR) 5 MG tablet TAKE 1 TABLET (5 MG TOTAL) BY MOUTH DAILY. 90 tablet 1  ? telmisartan (MICARDIS) 80 MG tablet Take 1 tablet (80 mg total) by mouth daily. 90 tablet 3  ? vitamin C (ASCORBIC ACID) 500 MG tablet Take 500 mg by mouth daily.    ? ?No current facility-administered medications on file prior to visit.  ? ?Past Medical History:  ?Diagnosis Date  ? Aortic atherosclerosis ([place]CC[place]  ? Arthritis   ? Constipation   ? Diabetes mellitus without complication ([place]CC[place] ? diet controlled  ? HTN (hypertension) 06/15/2015  ?  Pneumonia   ? ?Past Surgical History:  ?Procedure Laterality Date  ? ANKLE SURGERY    ? BREAST LUMPECTOMY    ? 5 times  ? CARPAL TUNNEL RELEASE    ? 10/2015  ? CATARACT EXTRACTION    ? CHOLECYSTECTOMY    ? METATARSAL OSTEOTOMY Left 12/09/2020  ? Procedure: Left second metatarsal phalangeal joint debridement, 2-3 weil osteotomies;  Surgeon: HewWylene SimmerD;  [facility];  Laterality: Left;  60 mins ?[facility] vs [facility]  ? NASAL SINUS SURGERY    ? right knee arthroscopic    ? ROTATOR CUFF REPAIR    ? TOTAL KNEE ARTHROPLASTY Right 03/28/2017  ? Procedure: RIGHT TOTAL KNEE ARTHROPLASTY;  Surgeon: GioLatanya MaudlinD;  [facility];  Service: Orthopedics;  Laterality: Right;  ? TUBAL LIGATION    ?  ?Family History  ?Problem Relation Age of Onset  ? Liver disease Father   ? Cirrhosis Father   ? Bone cancer Mother   ? Breast cancer Other   ?     aunts  ? Colon cancer Neg Hx   ? ?Social History  ? ?Socioeconomic History  ? Marital status: Married  ?  Spouse name: Not on file  ? Number of children: Not on file  ? Years of education: Not on file  ? Highest education level: Not on file  ?Occupational History  ? Occupation: Self Employed  ?Tobacco Use  ?  Smoking status: Never  ? Smokeless tobacco: Never  ?Vaping Use  ? Vaping Use: Never used  ?Substance and Sexual Activity  ? Alcohol use: No  ? Drug use: No  ? Sexual activity: Yes  ?Other Topics Concern  ? Not on file  ?Social History Narrative  ? Not on file  ? ?Social Determinants of Health  ? ?Financial Resource Strain: Not on file  ?Food Insecurity: Not on file  ?Transportation Needs: Not on file  ?Physical Activity: Not on file  ?Stress: Not on file  ?Social Connections: Not on file  ? ? ?Review of Systems  ?Constitutional:  Negative for appetite change, fatigue and fever.  ?HENT:  Negative for congestion, ear pain, sinus pressure and sore throat.   ?Respiratory:  Negative for cough, chest tightness, shortness of breath  and wheezing.   ?Cardiovascular:  Negative for chest pain and palpitations.  ?Gastrointestinal:  Negative for abdominal pain, constipation, diarrhea, nausea and vomiting.  ?Genitourinary:  Negative for dysuria and hematuria.  ?Musculoskeletal:  Negative for arthralgias, back pain, joint swelling and myalgias.  ?Skin:  Negative for rash.  ?Neurological:  Negative for dizziness, weakness and headaches.  ?Psychiatric/Behavioral:  Negative for dysphoric mood. The patient is not nervous/anxious.   ? ? ?Objective:  ?BP 122/72 (BP Location: Left Arm, Patient Position: Sitting)   Pulse 70   Temp (!) 97 ?F (36.1 ?C) (Temporal)   Ht _0  (1.575 m)   Wt 162 lb (73.5 kg)   SpO2 98%   BMI 29.63 kg/m?  ? ? ?  12/09/2021  ?  8:46 AM 06/14/2021  ?  9:30 AM 03/11/2021  ?  9:27 AM  ?BP/Weight  ?Systolic BP 825 003 704  ?Diastolic BP 72 62 72  ?Wt. (Lbs) 162 152 156  ?BMI 29.63 kg/m2 27.8 kg/m2 28.53 kg/m2  ? ? ?Physical Exam ?Vitals reviewed.  ?Constitutional:   ?   Appearance: Normal appearance. She is normal weight.  ?Cardiovascular:  ?   Rate and Rhythm: Normal rate and regular rhythm.  ?   Pulses: Normal pulses.  ?   Heart sounds: Normal heart sounds.  ?Pulmonary:  ?   Effort: Pulmonary effort is normal.  ?   Breath sounds: Normal breath sounds.  ?Abdominal:  ?   General: Abdomen is flat. Bowel sounds are normal.  ?   Palpations: Abdomen is soft.  ?Neurological:  ?   Mental Status: She is alert and oriented to person, place, and time.  ?Psychiatric:     ?   Mood and Affect: Mood normal.     ?   Behavior: Behavior normal.  ? ? ?Diabetic Foot Exam - Simple   ?Simple Foot Form ?Diabetic Foot exam was performed with the following findings: Yes 12/09/2021  5:45 PM  ?Visual Inspection ?No deformities, no ulcerations, no other skin breakdown bilaterally: Yes ?Sensation Testing ?Intact to touch and monofilament testing bilaterally: Yes ?Pulse Check ?Posterior Tibialis and Dorsalis pulse intact bilaterally: Yes ?Comments ?  ?  ? ?Lab  Results  ?Component Value Date  ? WBC 6.7 06/14/2021  ? HGB 12.7 06/14/2021  ? HCT 37.3 06/14/2021  ? PLT 273 06/14/2021  ? GLUCOSE 99 06/14/2021  ? CHOL 137 06/14/2021  ? TRIG 100 06/14/2021  ? HDL 50 06/14/2021  ? [place] 68 06/14/2021  ? ALT 8 06/14/2021  ? AST 18 06/14/2021  ? NA 139 06/14/2021  ? K 4.6 06/14/2021  ? CL 103 06/14/2021  ? CREATININE 1.23 (H) 06/14/2021  ? BUN  29 (H) 06/14/2021  ? CO2 23 06/14/2021  ? TSH 2.000 08/17/2020  ? INR 0.94 03/20/2017  ? HGBA1C 5.9 (H) 06/14/2021  ? MICROALBUR 30 12/06/2020  ? ? ? ? ?Assessment & Plan:  ? ?Problem List Items Addressed This Visit   ? ?  ? Cardiovascular and Mediastinum  ? Hypertension associated with diabetes ([place])  ?  Control: good ?Recommend check feet daily. ?Recommend annual eye exams. ?Continue to work on eating a healthy diet and exercise.  ?The current medical regimen is effective;  continue present plan and medications. ?Continue Amlodipine 134m 1 tablet daily, Telmisartan 8434m1 tablet daily.  ?Labs drawn today.   ? ?  ?  ? Relevant Orders  ? CBC With Diff/Platelet  ? Comprehensive metabolic panel  ? Aortic atherosclerosis ([place] ?  The current medical regimen is effective;  continue present plan and medications. ?Continue statin and aspirin. ? ?  ?  ?  ? Endocrine  ? Dyslipidemia associated with type 2 diabetes mellitus ([facility]  ?  Well controlled.  ?No changes to medicines.  ?Continue to work on eating a healthy diet and exercise.  ?Labs drawn today.  ? ?  ?  ? Relevant Orders  ? Lipid panel  ? Hemoglobin A1c  ? Microalbumin / creatinine urine ratio  ?  ? Musculoskeletal and Integument  ? Arthritis of right hand  ?  Continue celebrex. Limited to 100 mg daily to limit reanl impact. Patient is miserable without celebrex.  ? ?  ?  ?  ? Genitourinary  ? Stage 3a chronic kidney disease (HCEgl[facility]B ?  Check labs. Decrease celebrex 100 mg once daily. ? ?  ?  ?  ? Other  ? Mixed hyperlipidemia  ?  Continue Amlodipine 34m37m tablet daily, Telmisartan  55m22mtablet daily.  ? ?  ?  ?. ?Orders Placed This Encounter  ?Procedures  ? CBC With Diff/Platelet  ? Comprehensive metabolic panel  ? Lipid panel  ? Hemoglobin A1c  ? Microalbumin / creatinine urine ratio  ?  ? ?

## 2021-12-09 NOTE — Assessment & Plan Note (Signed)
Continue Amlodipine '5mg'$  1 tablet daily, Telmisartan '80mg'$  1 tablet daily.  ?

## 2021-12-09 NOTE — Assessment & Plan Note (Signed)
The current medical regimen is effective;  continue present plan and medications. ?Continue statin and aspirin. ?

## 2021-12-09 NOTE — Assessment & Plan Note (Addendum)
Continue celebrex. Limited to 100 mg daily to limit reanl impact. Patient is miserable without celebrex.  ?

## 2021-12-09 NOTE — Assessment & Plan Note (Addendum)
Control: good ?Recommend check feet daily. ?Recommend annual eye exams. ?Continue to work on eating a healthy diet and exercise.  ?The current medical regimen is effective;  continue present plan and medications. ?Continue Amlodipine '5mg'$  1 tablet daily, Telmisartan '80mg'$  1 tablet daily.  ?Labs drawn today.   ? ?

## 2021-12-10 LAB — COMPREHENSIVE METABOLIC PANEL
ALT: 13 IU/L (ref 0–32)
AST: 24 IU/L (ref 0–40)
Albumin/Globulin Ratio: 2 (ref 1.2–2.2)
Albumin: 4.5 g/dL (ref 3.7–4.7)
Alkaline Phosphatase: 62 IU/L (ref 44–121)
BUN/Creatinine Ratio: 22 (ref 12–28)
BUN: 26 mg/dL (ref 8–27)
Bilirubin Total: 0.6 mg/dL (ref 0.0–1.2)
CO2: 23 mmol/L (ref 20–29)
Calcium: 9.8 mg/dL (ref 8.7–10.3)
Chloride: 101 mmol/L (ref 96–106)
Creatinine, Ser: 1.16 mg/dL — ABNORMAL HIGH (ref 0.57–1.00)
Globulin, Total: 2.3 g/dL (ref 1.5–4.5)
Glucose: 106 mg/dL — ABNORMAL HIGH (ref 70–99)
Potassium: 4.8 mmol/L (ref 3.5–5.2)
Sodium: 139 mmol/L (ref 134–144)
Total Protein: 6.8 g/dL (ref 6.0–8.5)
eGFR: 49 mL/min/{1.73_m2} — ABNORMAL LOW (ref >59)

## 2021-12-10 LAB — LIPID PANEL
Chol/HDL Ratio: 2.3 ratio (ref 0.0–4.4)
Cholesterol, Total: 135 mg/dL (ref 100–199)
HDL: 60 mg/dL (ref >39)
LDL Chol Calc (NIH): 61 mg/dL (ref 0–99)
Triglycerides: 72 mg/dL (ref 0–149)
VLDL Cholesterol Cal: 14 mg/dL (ref 5–40)

## 2021-12-10 LAB — CBC WITH DIFF/PLATELET
Basophils Absolute: 0.1 10*3/uL (ref 0.0–0.2)
Basos: 1 %
EOS (ABSOLUTE): 0.2 10*3/uL (ref 0.0–0.4)
Eos: 3 %
Hematocrit: 38.3 % (ref 34.0–46.6)
Hemoglobin: 13.1 g/dL (ref 11.1–15.9)
Immature Grans (Abs): 0 10*3/uL (ref 0.0–0.1)
Immature Granulocytes: 0 %
Lymphocytes Absolute: 1.6 10*3/uL (ref 0.7–3.1)
Lymphs: 26 %
MCH: 31 pg (ref 26.6–33.0)
MCHC: 34.2 g/dL (ref 31.5–35.7)
MCV: 91 fL (ref 79–97)
Monocytes Absolute: 0.5 10*3/uL (ref 0.1–0.9)
Monocytes: 8 %
Neutrophils Absolute: 3.9 10*3/uL (ref 1.4–7.0)
Neutrophils: 62 %
Platelets: 283 10*3/uL (ref 150–450)
RBC: 4.22 x10E6/uL (ref 3.77–5.28)
RDW: 11.7 % (ref 11.7–15.4)
WBC: 6.2 10*3/uL (ref 3.4–10.8)

## 2021-12-10 LAB — HEMOGLOBIN A1C
Est. average glucose Bld gHb Est-mCnc: 120 mg/dL
Hgb A1c MFr Bld: 5.8 % — ABNORMAL HIGH (ref 4.8–5.6)

## 2021-12-10 LAB — CARDIOVASCULAR RISK ASSESSMENT

## 2021-12-10 LAB — MICROALBUMIN / CREATININE URINE RATIO
Creatinine, Urine: 103.2 mg/dL
Microalb/Creat Ratio: 17 mg/g creat (ref 0–29)
Microalbumin, Urine: 17.3 ug/mL

## 2021-12-12 ENCOUNTER — Ambulatory Visit: Payer: Medicare Other | Admitting: Family Medicine

## 2022-02-07 ENCOUNTER — Other Ambulatory Visit: Payer: Self-pay | Admitting: Family Medicine

## 2022-02-07 DIAGNOSIS — M19041 Primary osteoarthritis, right hand: Secondary | ICD-10-CM

## 2022-02-19 ENCOUNTER — Other Ambulatory Visit: Payer: Self-pay | Admitting: Family Medicine

## 2022-02-19 DIAGNOSIS — E782 Mixed hyperlipidemia: Secondary | ICD-10-CM

## 2022-03-22 DIAGNOSIS — M25562 Pain in left knee: Secondary | ICD-10-CM | POA: Diagnosis not present

## 2022-03-29 ENCOUNTER — Other Ambulatory Visit: Payer: Self-pay | Admitting: Obstetrics & Gynecology

## 2022-03-29 DIAGNOSIS — R928 Other abnormal and inconclusive findings on diagnostic imaging of breast: Secondary | ICD-10-CM

## 2022-03-31 ENCOUNTER — Other Ambulatory Visit: Payer: Self-pay | Admitting: Family Medicine

## 2022-03-31 DIAGNOSIS — I10 Essential (primary) hypertension: Secondary | ICD-10-CM

## 2022-04-15 ENCOUNTER — Other Ambulatory Visit: Payer: Self-pay | Admitting: Family Medicine

## 2022-04-15 DIAGNOSIS — I10 Essential (primary) hypertension: Secondary | ICD-10-CM

## 2022-04-17 ENCOUNTER — Encounter: Payer: Self-pay | Admitting: Family Medicine

## 2022-04-17 ENCOUNTER — Ambulatory Visit (INDEPENDENT_AMBULATORY_CARE_PROVIDER_SITE_OTHER): Payer: Medicare Other

## 2022-04-17 DIAGNOSIS — Z23 Encounter for immunization: Secondary | ICD-10-CM

## 2022-04-28 DIAGNOSIS — H353131 Nonexudative age-related macular degeneration, bilateral, early dry stage: Secondary | ICD-10-CM | POA: Diagnosis not present

## 2022-04-28 DIAGNOSIS — H524 Presbyopia: Secondary | ICD-10-CM | POA: Diagnosis not present

## 2022-04-28 LAB — HM DIABETES EYE EXAM

## 2022-05-05 ENCOUNTER — Ambulatory Visit
Admission: RE | Admit: 2022-05-05 | Discharge: 2022-05-05 | Disposition: A | Discharge status: Home / Self Care | Payer: Medicare Other | Source: Ambulatory Visit | Attending: Obstetrics & Gynecology | Admitting: Obstetrics & Gynecology

## 2022-05-05 DIAGNOSIS — N6323 Unspecified lump in the left breast, lower outer quadrant: Secondary | ICD-10-CM | POA: Diagnosis not present

## 2022-05-05 DIAGNOSIS — R928 Other abnormal and inconclusive findings on diagnostic imaging of breast: Secondary | ICD-10-CM

## 2022-05-20 ENCOUNTER — Other Ambulatory Visit: Payer: Self-pay | Admitting: Family Medicine

## 2022-05-20 DIAGNOSIS — M19041 Primary osteoarthritis, right hand: Secondary | ICD-10-CM

## 2022-06-12 NOTE — Progress Notes (Unsigned)
Subjective:  Patient ID: Denise [name], female    DOB: 19-Sep-1945  Age: 76 y.o. MRN: [id number]  No chief complaint on file.  PULL NCIR...  HPI   Diabetes:  Complications: hyperlipidemia, hypertension Glucose checking: not checking Glucose logs: No Hypoglycemia:No Most recent A1C: 5.9 Current medications: None- Patient is diet controlled. Checking feet daily.   Hyperlipidemia: Current medications: Fenofibrate 172m 1 tablet once daily, rosuvastatin 5 mg once daily. UTD. NO side effects.   Hypertension: Current medications: Amlodipine 564m1 tablet daily, Telmisartan 8052m tablet daily. UTD  Ankle pain: celebrex helps. Has had foot and ankle surgery.   Diet:fairly healthy Exercise: still walking. Current Outpatient Medications on File Prior to Visit  Medication Sig Dispense Refill   amLODipine (NORVASC) 5 MG tablet TAKE 1 TABLET (5 MG TOTAL) BY MOUTH DAILY. 90 tablet 1   Calcium Carb-Cholecalciferol (CALCIUM 600 + D PO) Take 1 tablet by mouth daily.     celecoxib (CELEBREX) 100 MG capsule TAKE 1 CAPSULE BY MOUTH EVERY DAY 90 capsule 1   fenofibrate 160 MG tablet TAKE 1 TABLET BY MOUTH EVERY DAY 90 tablet 1   mometasone (ELOCON) 0.1 % cream mometasone 0.1 % topical cream  APPLY SPARINGLY TO AFFECTED AREA EVERY DAY     Multiple Vitamin (MULTIVITAMIN) tablet Take 1 tablet by mouth daily.     rosuvastatin (CRESTOR) 5 MG tablet TAKE 1 TABLET (5 MG TOTAL) BY MOUTH DAILY. 90 tablet 1   telmisartan (MICARDIS) 80 MG tablet TAKE 1 TABLET BY MOUTH EVERY DAY 90 tablet 1   vitamin C (ASCORBIC ACID) 500 MG tablet Take 500 mg by mouth daily.     No current facility-administered medications on file prior to visit.   Past Medical History:  Diagnosis Date   [place]   Arthritis    Constipation    [place] ([place]  diet controlled   HTN (hypertension) 06/15/2015   Pneumonia    Past Surgical History:  Procedure Laterality  Date   ANKLE SURGERY     BREAST LUMPECTOMY     5 times   CARPAL TUNNEL RELEASE     10/2015   CATARACT EXTRACTION     CHOLECYSTECTOMY     METATARSAL OSTEOTOMY Left 12/09/2020   Procedure: Left second metatarsal phalangeal joint debridement, 2-3 weil osteotomies;  Surgeon: HewWylene SimmerD;  [facility]  Laterality: Left;  60 mins [facility] vs General   NASAL SINUS SURGERY     right knee arthroscopic     ROTATOR CUFF REPAIR     TOTAL KNEE ARTHROPLASTY Right 03/28/2017   Procedure: RIGHT TOTAL KNEE ARTHROPLASTY;  Surgeon: GioLatanya MaudlinD;  [facility];  Service: Orthopedics;  Laterality: Right;   TUBAL LIGATION      Family History  Problem Relation Age of Onset   Liver disease Father    Cirrhosis Father    Bone cancer Mother    Breast cancer Other        aunts   Colon cancer Neg Hx    Social History   Socioeconomic History   Marital status: Married    Spouse name: Not on file   Number of children: Not on file   Years of education: Not on file   Highest education level: Not on file  Occupational History   Occupation: Self Employed  Tobacco Use   Smoking status: Never   Smokeless tobacco: Never  Vaping  Use   Vaping Use: Never used  Substance and Sexual Activity   Alcohol use: No   Drug use: No   Sexual activity: Yes  Other Topics Concern   Not on file  Social History Narrative   Not on file   Social Determinants of Health   Financial Resource Strain: Not on file  Food Insecurity: Not on file  Transportation Needs: Not on file  Physical Activity: Not on file  Stress: Not on file  Social Connections: Not on file    Review of Systems  Constitutional:  Negative for chills, fatigue and fever.  HENT:  Negative for congestion, ear pain and sore throat.   Respiratory:  Negative for cough and shortness of breath.   Cardiovascular:  Negative for chest pain.  Gastrointestinal:  Negative for abdominal pain,  constipation, diarrhea, nausea and vomiting.  Genitourinary:  Negative for dysuria and urgency.  Musculoskeletal:  Negative for arthralgias and myalgias.  Skin:  Negative for rash.  Neurological:  Negative for dizziness and headaches.  Psychiatric/Behavioral:  Negative for dysphoric mood. The patient is not nervous/anxious.      Objective:  There were no vitals taken for this visit.     12/09/2021    8:46 AM 06/14/2021    9:30 AM 03/11/2021    9:27 AM  BP/Weight  Systolic BP 732 202 542  Diastolic BP 72 62 72  Wt. (Lbs) 162 152 156  BMI 29.63 kg/m2 27.8 kg/m2 28.53 kg/m2    Physical Exam Vitals reviewed.  Constitutional:      Appearance: Normal appearance. She is normal weight.  Neck:     Vascular: No carotid bruit.  Cardiovascular:     Rate and Rhythm: Normal rate and regular rhythm.     Heart sounds: Normal heart sounds.  Pulmonary:     Effort: Pulmonary effort is normal. No respiratory distress.     Breath sounds: Normal breath sounds.  Abdominal:     General: Abdomen is flat. Bowel sounds are normal.     Palpations: Abdomen is soft.     Tenderness: There is no abdominal tenderness.  Neurological:     Mental Status: She is alert and oriented to person, place, and time.  Psychiatric:        Mood and Affect: Mood normal.        Behavior: Behavior normal.     Diabetic Foot Exam - Simple   No data filed      Lab Results  Component Value Date   WBC 6.2 12/09/2021   HGB 13.1 12/09/2021   HCT 38.3 12/09/2021   PLT 283 12/09/2021   GLUCOSE 106 (H) 12/09/2021   CHOL 135 12/09/2021   TRIG 72 12/09/2021   HDL 60 12/09/2021   LDLCALC 61 12/09/2021   ALT 13 12/09/2021   AST 24 12/09/2021   NA 139 12/09/2021   K 4.8 12/09/2021   CL 101 12/09/2021   CREATININE 1.16 (H) 12/09/2021   BUN 26 12/09/2021   CO2 23 12/09/2021   TSH 2.000 08/17/2020   INR 0.94 03/20/2017   HGBA1C 5.8 (H) 12/09/2021   MICROALBUR 30 12/06/2020      Assessment & Plan:   Problem  List Items Addressed This Visit   None .  No orders of the defined types were placed in this encounter.   No orders of the defined types were placed in this encounter.    Follow-up: No follow-ups on file.  An After Visit Summary was printed and given  to the patient.  Rochel Brome, MD [facility] [phone]

## 2022-06-13 ENCOUNTER — Encounter: Payer: Self-pay | Admitting: Family Medicine

## 2022-06-13 ENCOUNTER — Ambulatory Visit (INDEPENDENT_AMBULATORY_CARE_PROVIDER_SITE_OTHER): Payer: Medicare Other | Admitting: Family Medicine

## 2022-06-13 VITALS — BP 110/60 | HR 73 | Temp 97.5°F | Resp 16 | Ht 62.0 in | Wt 152.6 lb

## 2022-06-13 DIAGNOSIS — M8589 Other specified disorders of bone density and structure, multiple sites: Secondary | ICD-10-CM | POA: Diagnosis not present

## 2022-06-13 DIAGNOSIS — I152 Hypertension secondary to endocrine disorders: Secondary | ICD-10-CM

## 2022-06-13 DIAGNOSIS — Z23 Encounter for immunization: Secondary | ICD-10-CM | POA: Diagnosis not present

## 2022-06-13 DIAGNOSIS — Z1159 Encounter for screening for other viral diseases: Secondary | ICD-10-CM | POA: Diagnosis not present

## 2022-06-13 DIAGNOSIS — E1159 Type 2 diabetes mellitus with other circulatory complications: Secondary | ICD-10-CM | POA: Diagnosis not present

## 2022-06-13 DIAGNOSIS — E785 Hyperlipidemia, unspecified: Secondary | ICD-10-CM | POA: Diagnosis not present

## 2022-06-13 DIAGNOSIS — M25512 Pain in left shoulder: Secondary | ICD-10-CM | POA: Diagnosis not present

## 2022-06-13 DIAGNOSIS — Z0001 Encounter for general adult medical examination with abnormal findings: Secondary | ICD-10-CM | POA: Diagnosis not present

## 2022-06-13 DIAGNOSIS — M19041 Primary osteoarthritis, right hand: Secondary | ICD-10-CM

## 2022-06-13 DIAGNOSIS — N1831 Chronic kidney disease, stage 3a: Secondary | ICD-10-CM | POA: Diagnosis not present

## 2022-06-13 DIAGNOSIS — E1169 Type 2 diabetes mellitus with other specified complication: Secondary | ICD-10-CM

## 2022-06-13 DIAGNOSIS — Z6827 Body mass index (BMI) 27.0-27.9, adult: Secondary | ICD-10-CM | POA: Diagnosis not present

## 2022-06-13 DIAGNOSIS — M545 Low back pain, unspecified: Secondary | ICD-10-CM

## 2022-06-13 NOTE — Assessment & Plan Note (Signed)
Exercises given 

## 2022-06-13 NOTE — Assessment & Plan Note (Signed)
Things to do to keep yourself healthy  - Exercise at least 30-45 minutes a day, 3-4 days a week.  - Eat a low-fat diet with lots of fruits and vegetables, up to 7-9 servings per day.  - Seatbelts can save your life. Wear them always.  - Smoke detectors on every level of your home, check batteries every year.  - Eye Doctor - have an eye exam every 1-2 years  - Alcohol -  If you drink, do it moderately, less than 2 drinks per day.  - [place]. Choose someone to speak for you if you are not able. Please bring a copy.  - Depression is common in our stressful world.If you're feeling down or losing interest in things you normally enjoy, please come in for a visit.   Call if various pains are not improving with exercises and tylenol.

## 2022-06-13 NOTE — Assessment & Plan Note (Signed)
Stable

## 2022-06-13 NOTE — Patient Instructions (Addendum)
Things to do to keep yourself healthy  - Exercise at least 30-45 minutes a day, 3-4 days a week.  - Eat a low-fat diet with lots of fruits and vegetables, up to 7-9 servings per day.  - Seatbelts can save your life. Wear them always.  - Smoke detectors on every level of your home, check batteries every year.  - Eye Doctor - have an eye exam every 1-2 years  - Alcohol -  If you drink, do it moderately, less than 2 drinks per day.  - [place]. Choose someone to speak for you if you are not able. Please bring a copy.  - Depression is common in our stressful world.If you're feeling down or losing interest in things you normally enjoy, please come in for a visit.   Call if various pains are not improving with exercises and tylenol.

## 2022-06-13 NOTE — Assessment & Plan Note (Addendum)
Ordered bone density

## 2022-06-13 NOTE — Assessment & Plan Note (Signed)
Continue celebrex.

## 2022-06-13 NOTE — Assessment & Plan Note (Addendum)
Well controlled.  No changes to medicines. Continue Fenofibrate '160mg'$  1 tablet once daily, rosuvastatin 5 mg once daily  Continue to work on eating a healthy diet and exercise.  Labs drawn today.

## 2022-06-13 NOTE — Assessment & Plan Note (Signed)
Well controlled.  ?No changes to medicines.  ?Continue to work on eating a healthy diet and exercise.  ?Labs drawn today.  ?

## 2022-06-14 LAB — LIPID PANEL
Chol/HDL Ratio: 2.7 ratio (ref 0.0–4.4)
Cholesterol, Total: 128 mg/dL (ref 100–199)
HDL: 48 mg/dL (ref >39)
LDL Chol Calc (NIH): 62 mg/dL (ref 0–99)
Triglycerides: 98 mg/dL (ref 0–149)
VLDL Cholesterol Cal: 18 mg/dL (ref 5–40)

## 2022-06-14 LAB — CBC WITH DIFFERENTIAL/PLATELET
Basophils Absolute: 0.1 10*3/uL (ref 0.0–0.2)
Basos: 1 %
EOS (ABSOLUTE): 0.2 10*3/uL (ref 0.0–0.4)
Eos: 4 %
Hematocrit: 37.3 % (ref 34.0–46.6)
Hemoglobin: 12.4 g/dL (ref 11.1–15.9)
Immature Grans (Abs): 0 10*3/uL (ref 0.0–0.1)
Immature Granulocytes: 0 %
Lymphocytes Absolute: 1.3 10*3/uL (ref 0.7–3.1)
Lymphs: 26 %
MCH: 30.5 pg (ref 26.6–33.0)
MCHC: 33.2 g/dL (ref 31.5–35.7)
MCV: 92 fL (ref 79–97)
Monocytes Absolute: 0.4 10*3/uL (ref 0.1–0.9)
Monocytes: 8 %
Neutrophils Absolute: 3.2 10*3/uL (ref 1.4–7.0)
Neutrophils: 61 %
Platelets: 278 10*3/uL (ref 150–450)
RBC: 4.07 x10E6/uL (ref 3.77–5.28)
RDW: 11.9 % (ref 11.7–15.4)
WBC: 5.1 10*3/uL (ref 3.4–10.8)

## 2022-06-14 LAB — HCV INTERPRETATION

## 2022-06-14 LAB — COMPREHENSIVE METABOLIC PANEL
ALT: 8 IU/L (ref 0–32)
AST: 24 IU/L (ref 0–40)
Albumin/Globulin Ratio: 2.2 (ref 1.2–2.2)
Albumin: 4.6 g/dL (ref 3.8–4.8)
Alkaline Phosphatase: 57 IU/L (ref 44–121)
BUN/Creatinine Ratio: 22 (ref 12–28)
BUN: 23 mg/dL (ref 8–27)
Bilirubin Total: 0.4 mg/dL (ref 0.0–1.2)
CO2: 22 mmol/L (ref 20–29)
Calcium: 9.7 mg/dL (ref 8.7–10.3)
Chloride: 105 mmol/L (ref 96–106)
Creatinine, Ser: 1.04 mg/dL — ABNORMAL HIGH (ref 0.57–1.00)
Globulin, Total: 2.1 g/dL (ref 1.5–4.5)
Glucose: 106 mg/dL — ABNORMAL HIGH (ref 70–99)
Potassium: 4.6 mmol/L (ref 3.5–5.2)
Sodium: 142 mmol/L (ref 134–144)
Total Protein: 6.7 g/dL (ref 6.0–8.5)
eGFR: 56 mL/min/{1.73_m2} — ABNORMAL LOW (ref >59)

## 2022-06-14 LAB — CARDIOVASCULAR RISK ASSESSMENT

## 2022-06-14 LAB — HEMOGLOBIN A1C
Est. average glucose Bld gHb Est-mCnc: 126 mg/dL
Hgb A1c MFr Bld: 6 % — ABNORMAL HIGH (ref 4.8–5.6)

## 2022-06-14 LAB — HCV AB W REFLEX TO QUANT PCR: HCV Ab: NONREACTIVE

## 2022-06-14 NOTE — Progress Notes (Signed)
Blood count normal.  Liver function normal.  Kidney function abnormal. Improved.  Cholesterol: good! HBA1C: 6. Good. Hep C negative.

## 2022-06-15 ENCOUNTER — Encounter: Payer: Self-pay | Admitting: Family Medicine

## 2022-06-19 DIAGNOSIS — Z01419 Encounter for gynecological examination (general) (routine) without abnormal findings: Secondary | ICD-10-CM | POA: Diagnosis not present

## 2022-06-19 DIAGNOSIS — Z6827 Body mass index (BMI) 27.0-27.9, adult: Secondary | ICD-10-CM | POA: Diagnosis not present

## 2022-08-16 ENCOUNTER — Other Ambulatory Visit: Payer: Self-pay | Admitting: Family Medicine

## 2022-08-16 DIAGNOSIS — E782 Mixed hyperlipidemia: Secondary | ICD-10-CM

## 2022-09-03 ENCOUNTER — Encounter: Payer: Self-pay | Admitting: Family Medicine

## 2022-09-24 ENCOUNTER — Other Ambulatory Visit: Payer: Self-pay | Admitting: Family Medicine

## 2022-09-24 DIAGNOSIS — I10 Essential (primary) hypertension: Secondary | ICD-10-CM

## 2022-09-27 DIAGNOSIS — M85851 Other specified disorders of bone density and structure, right thigh: Secondary | ICD-10-CM | POA: Diagnosis not present

## 2022-09-27 DIAGNOSIS — M85832 Other specified disorders of bone density and structure, left forearm: Secondary | ICD-10-CM | POA: Diagnosis not present

## 2022-09-27 DIAGNOSIS — N959 Unspecified menopausal and perimenopausal disorder: Secondary | ICD-10-CM | POA: Diagnosis not present

## 2022-09-27 LAB — HM DEXA SCAN

## 2022-10-08 ENCOUNTER — Other Ambulatory Visit: Payer: Self-pay | Admitting: Family Medicine

## 2022-10-08 DIAGNOSIS — I10 Essential (primary) hypertension: Secondary | ICD-10-CM

## 2022-11-13 ENCOUNTER — Other Ambulatory Visit: Payer: Self-pay | Admitting: Family Medicine

## 2022-12-17 NOTE — Assessment & Plan Note (Signed)
Control: good Recommend check sugars fasting daily. Recommend check feet daily. Recommend annual eye exams. Medicines: none Continue to work on eating a healthy diet and exercise.  Labs drawn today.    

## 2022-12-17 NOTE — Assessment & Plan Note (Signed)
Stable

## 2022-12-17 NOTE — Assessment & Plan Note (Signed)
Well controlled.  No changes to medicines. Telmisartan 80 mg daily, amlodipine 5 mg daily. Continue to work on eating a healthy diet and exercise.  Labs drawn today.

## 2022-12-17 NOTE — Progress Notes (Unsigned)
Subjective:  Patient ID: Denise [name], female    DOB: June 10, 1946  Age: 77 y.o. MRN: [id number]  No chief complaint on file.   HPI   Diabetes:  Complications: hyperlipidemia, hypertension Glucose checking: not checking Glucose logs: No Hypoglycemia:No Most recent A1C: 6.0 Current medications: None- Patient is diet controlled. Checking feet daily.   Hyperlipidemia: Current medications: Fenofibrate 160mg  1 tablet once daily, rosuvastatin 5 mg once daily.  NO side effects.   Hypertension: Current medications: Amlodipine 5mg  1 tablet daily, Telmisartan 80mg  1 tablet daily.   Ankle pain: celebrex helps. Has had foot and ankle surgery.   Diet:fairly healthy Exercise: still walking.     06/13/2022    9:10 AM 12/09/2021    8:48 AM 12/06/2020    8:21 AM 08/17/2020    9:34 AM 04/15/2020    9:40 AM  Depression screen PHQ 2/9  Decreased Interest 0 0 0 0 0  Down, Depressed, Hopeless 0 0 0 0 0  PHQ - 2 Score 0 0 0 0 0        06/13/2022    9:14 AM  Fall Risk   Falls in the past year? 0  Number falls in past yr: 0  Injury with Fall? 0  Risk for fall due to : No Fall Risks  Follow up Falls evaluation completed    Patient Care Team: Blane Ohara, MD as PCP - General (Family Medicine)   Review of Systems  Current Outpatient Medications on File Prior to Visit  Medication Sig Dispense Refill   amLODipine (NORVASC) 5 MG tablet TAKE 1 TABLET (5 MG TOTAL) BY MOUTH DAILY. 90 tablet 1   Calcium Carb-Cholecalciferol (CALCIUM 600 + D PO) Take 1 tablet by mouth daily.     celecoxib (CELEBREX) 100 MG capsule TAKE 1 CAPSULE BY MOUTH EVERY DAY 90 capsule 1   fenofibrate 160 MG tablet TAKE 1 TABLET BY MOUTH EVERY DAY 90 tablet 1   mometasone (ELOCON) 0.1 % cream mometasone 0.1 % topical cream  APPLY SPARINGLY TO AFFECTED AREA EVERY DAY     Multiple Vitamin (MULTIVITAMIN) tablet Take 1 tablet by mouth daily.     rosuvastatin (CRESTOR) 5 MG tablet TAKE 1 TABLET (5 MG TOTAL) BY MOUTH  DAILY. 90 tablet 1   telmisartan (MICARDIS) 80 MG tablet TAKE 1 TABLET BY MOUTH EVERY DAY 90 tablet 0   vitamin C (ASCORBIC ACID) 500 MG tablet Take 500 mg by mouth daily.     No current facility-administered medications on file prior to visit.   Past Medical History:  Diagnosis Date   Aortic atherosclerosis (HCC) 2020   Arthritis    Constipation    Diabetes mellitus without complication (HCC)    diet controlled   HTN (hypertension) 06/15/2015   Pneumonia    Past Surgical History:  Procedure Laterality Date   ANKLE SURGERY     BREAST LUMPECTOMY     5 times   CARPAL TUNNEL RELEASE     10/2015   CATARACT EXTRACTION     CHOLECYSTECTOMY     METATARSAL OSTEOTOMY Left 12/09/2020   Procedure: Left second metatarsal phalangeal joint debridement, 2-3 weil osteotomies;  Surgeon: Toni Arthurs, MD;  Location: [facility];  Service: Orthopedics;  Laterality: Left;  60 mins [facility] vs General   NASAL SINUS SURGERY     right knee arthroscopic     ROTATOR CUFF REPAIR     TOTAL KNEE ARTHROPLASTY Right 03/28/2017   Procedure: RIGHT TOTAL KNEE ARTHROPLASTY;  Surgeon: Ranee Gosselin, MD;  Location: [facility];  Service: Orthopedics;  Laterality: Right;   TUBAL LIGATION      Family History  Problem Relation Age of Onset   Liver disease Father    Cirrhosis Father    Bone cancer Mother    Breast cancer Other        aunts   Colon cancer Neg Hx    Social History   Socioeconomic History   Marital status: Married    Spouse name: Not on file   Number of children: Not on file   Years of education: Not on file   Highest education level: 12th grade  Occupational History   Occupation: Self Employed  Tobacco Use   Smoking status: Never   Smokeless tobacco: Never  Vaping Use   Vaping Use: Never used  Substance and Sexual Activity   Alcohol use: No   Drug use: No   Sexual activity: Yes  Other Topics Concern   Not on file  Social History Narrative   Not on file   Social  Determinants of Health   Financial Resource Strain: Low Risk  (12/14/2022)   Overall Financial Resource Strain (CARDIA)    Difficulty of Paying Living Expenses: Not hard at all  Food Insecurity: No Food Insecurity (12/14/2022)   Hunger Vital Sign    Worried About Running Out of Food in the Last Year: Never true    Ran Out of Food in the Last Year: Never true  Transportation Needs: No Transportation Needs (12/14/2022)   PRAPARE - Administrator, Civil Service (Medical): No    Lack of Transportation (Non-Medical): No  Physical Activity: Insufficiently Active (12/14/2022)   Exercise Vital Sign    Days of Exercise per Week: 2 days    Minutes of Exercise per Session: 50 min  Stress: No Stress Concern Present (12/14/2022)   [employer] of Occupational Health - Occupational Stress Questionnaire    Feeling of Stress : Not at all  Social Connections: Socially Integrated (12/14/2022)   Social Connection and Isolation Panel [NHANES]    Frequency of Communication with Friends and Family: More than three times a week    Frequency of Social Gatherings with Friends and Family: Twice a week    Attends Religious Services: More than 4 times per year    Active Member of [employer] or Organizations: No    Attends Engineer, structural: More than 4 times per year    Marital Status: Married    Objective:  There were no vitals taken for this visit.     06/13/2022    8:09 AM 12/09/2021    8:46 AM 06/14/2021    9:30 AM  BP/Weight  Systolic BP 110 122 122  Diastolic BP 60 72 62  Wt. (Lbs) 152.6 162 152  BMI 27.91 kg/m2 29.63 kg/m2 27.8 kg/m2    Physical Exam  Diabetic Foot Exam - Simple   No data filed      Lab Results  Component Value Date   WBC 5.1 06/13/2022   HGB 12.4 06/13/2022   HCT 37.3 06/13/2022   PLT 278 06/13/2022   GLUCOSE 106 (H) 06/13/2022   CHOL 128 06/13/2022   TRIG 98 06/13/2022   HDL 48 06/13/2022   LDLCALC 62 06/13/2022   ALT 8 06/13/2022   AST 24  06/13/2022   NA 142 06/13/2022   K 4.6 06/13/2022   CL 105 06/13/2022   CREATININE 1.04 (H) 06/13/2022   BUN 23  06/13/2022   CO2 22 06/13/2022   TSH 2.000 08/17/2020   INR 0.94 03/20/2017   HGBA1C 6.0 (H) 06/13/2022   MICROALBUR 30 12/06/2020      Assessment & Plan:    Hypertension associated with diabetes [facility]) Assessment & Plan: Well controlled.  No changes to medicines. Telmisartan 80 mg daily, amlodipine 5 mg daily. Continue to work on eating a healthy diet and exercise.  Labs drawn today.     Dyslipidemia associated with type 2 diabetes mellitus (HCC) Assessment & Plan: Control:  Recommend check sugars fasting daily. Recommend check feet daily. Recommend annual eye exams. Medicines: none Continue to work on eating a healthy diet and exercise.  Labs drawn today.      Stage 3a chronic kidney disease (HCC) Assessment & Plan: Stable.    Mixed hyperlipidemia Assessment & Plan: Well controlled.  No changes to medicines. Fenofibrate 160 mg daily, rosuvastatin 5 mg daily,  Continue to work on eating a healthy diet and exercise.  Labs drawn today.        No orders of the defined types were placed in this encounter.   No orders of the defined types were placed in this encounter.    Follow-up: No follow-ups on file.   I,Denise [name],acting as a scribe for Blane Ohara, MD.,have documented all relevant documentation on the behalf of Blane Ohara, MD,as directed by  Blane Ohara, MD while in the presence of Blane Ohara, MD.   An After Visit Summary was printed and given to the patient.  Blane Ohara, MD [name] [phone]

## 2022-12-17 NOTE — Assessment & Plan Note (Signed)
Well controlled.  No changes to medicines. Fenofibrate 160 mg daily, rosuvastatin 5 mg daily,  Continue to work on eating a healthy diet and exercise.  Labs drawn today.

## 2022-12-18 ENCOUNTER — Encounter: Payer: Self-pay | Admitting: Family Medicine

## 2022-12-18 ENCOUNTER — Ambulatory Visit (INDEPENDENT_AMBULATORY_CARE_PROVIDER_SITE_OTHER): Payer: Medicare Other | Admitting: Family Medicine

## 2022-12-18 VITALS — BP 128/60 | HR 78 | Temp 96.0°F | Resp 16 | Ht 62.0 in | Wt 153.4 lb

## 2022-12-18 DIAGNOSIS — H9313 Tinnitus, bilateral: Secondary | ICD-10-CM

## 2022-12-18 DIAGNOSIS — E782 Mixed hyperlipidemia: Secondary | ICD-10-CM

## 2022-12-18 DIAGNOSIS — E1122 Type 2 diabetes mellitus with diabetic chronic kidney disease: Secondary | ICD-10-CM | POA: Diagnosis not present

## 2022-12-18 DIAGNOSIS — E785 Hyperlipidemia, unspecified: Secondary | ICD-10-CM | POA: Diagnosis not present

## 2022-12-18 DIAGNOSIS — I152 Hypertension secondary to endocrine disorders: Secondary | ICD-10-CM

## 2022-12-18 DIAGNOSIS — I700 Atherosclerosis of aorta: Secondary | ICD-10-CM

## 2022-12-18 DIAGNOSIS — E1159 Type 2 diabetes mellitus with other circulatory complications: Secondary | ICD-10-CM | POA: Diagnosis not present

## 2022-12-18 DIAGNOSIS — I129 Hypertensive chronic kidney disease with stage 1 through stage 4 chronic kidney disease, or unspecified chronic kidney disease: Secondary | ICD-10-CM

## 2022-12-18 DIAGNOSIS — N1831 Chronic kidney disease, stage 3a: Secondary | ICD-10-CM | POA: Diagnosis not present

## 2022-12-18 DIAGNOSIS — E1169 Type 2 diabetes mellitus with other specified complication: Secondary | ICD-10-CM | POA: Diagnosis not present

## 2022-12-18 DIAGNOSIS — I10 Essential (primary) hypertension: Secondary | ICD-10-CM

## 2022-12-18 NOTE — Assessment & Plan Note (Signed)
Management per specialist. 

## 2022-12-18 NOTE — Patient Instructions (Signed)
Referral to ENT for tinnitus. Continue current treatment.

## 2022-12-19 LAB — COMPREHENSIVE METABOLIC PANEL
ALT: 11 IU/L (ref 0–32)
AST: 23 IU/L (ref 0–40)
Albumin/Globulin Ratio: 2.1 (ref 1.2–2.2)
Albumin: 4.6 g/dL (ref 3.8–4.8)
Alkaline Phosphatase: 58 IU/L (ref 44–121)
BUN/Creatinine Ratio: 23 (ref 12–28)
BUN: 25 mg/dL (ref 8–27)
Bilirubin Total: 0.5 mg/dL (ref 0.0–1.2)
CO2: 22 mmol/L (ref 20–29)
Calcium: 9.4 mg/dL (ref 8.7–10.3)
Chloride: 106 mmol/L (ref 96–106)
Creatinine, Ser: 1.09 mg/dL — ABNORMAL HIGH (ref 0.57–1.00)
Globulin, Total: 2.2 g/dL (ref 1.5–4.5)
Glucose: 108 mg/dL — ABNORMAL HIGH (ref 70–99)
Potassium: 4.4 mmol/L (ref 3.5–5.2)
Sodium: 143 mmol/L (ref 134–144)
Total Protein: 6.8 g/dL (ref 6.0–8.5)
eGFR: 52 mL/min/{1.73_m2} — ABNORMAL LOW (ref >59)

## 2022-12-19 LAB — HEMOGLOBIN A1C
Est. average glucose Bld gHb Est-mCnc: 123 mg/dL
Hgb A1c MFr Bld: 5.9 % — ABNORMAL HIGH (ref 4.8–5.6)

## 2022-12-19 LAB — LIPID PANEL
Chol/HDL Ratio: 2.5 ratio (ref 0.0–4.4)
Cholesterol, Total: 133 mg/dL (ref 100–199)
HDL: 53 mg/dL (ref >39)
LDL Chol Calc (NIH): 64 mg/dL (ref 0–99)
Triglycerides: 79 mg/dL (ref 0–149)
VLDL Cholesterol Cal: 16 mg/dL (ref 5–40)

## 2022-12-19 LAB — CBC WITH DIFFERENTIAL/PLATELET
Basophils Absolute: 0.1 10*3/uL (ref 0.0–0.2)
Basos: 1 %
EOS (ABSOLUTE): 0.2 10*3/uL (ref 0.0–0.4)
Eos: 4 %
Hematocrit: 38.2 % (ref 34.0–46.6)
Hemoglobin: 12.4 g/dL (ref 11.1–15.9)
Immature Grans (Abs): 0 10*3/uL (ref 0.0–0.1)
Immature Granulocytes: 0 %
Lymphocytes Absolute: 1.7 10*3/uL (ref 0.7–3.1)
Lymphs: 27 %
MCH: 29.7 pg (ref 26.6–33.0)
MCHC: 32.5 g/dL (ref 31.5–35.7)
MCV: 91 fL (ref 79–97)
Monocytes Absolute: 0.5 10*3/uL (ref 0.1–0.9)
Monocytes: 8 %
Neutrophils Absolute: 3.7 10*3/uL (ref 1.4–7.0)
Neutrophils: 60 %
Platelets: 289 10*3/uL (ref 150–450)
RBC: 4.18 x10E6/uL (ref 3.77–5.28)
RDW: 12.2 % (ref 11.7–15.4)
WBC: 6.2 10*3/uL (ref 3.4–10.8)

## 2022-12-19 LAB — MICROALBUMIN / CREATININE URINE RATIO
Creatinine, Urine: 99.2 mg/dL
Microalb/Creat Ratio: <3 mg/g creat (ref 0–29)
Microalbumin, Urine: <3 ug/mL

## 2022-12-19 LAB — TSH: TSH: 2.65 u[IU]/mL (ref 0.450–4.500)

## 2022-12-19 LAB — CARDIOVASCULAR RISK ASSESSMENT

## 2022-12-21 ENCOUNTER — Other Ambulatory Visit: Payer: Self-pay | Admitting: Family Medicine

## 2022-12-21 DIAGNOSIS — I10 Essential (primary) hypertension: Secondary | ICD-10-CM

## 2023-01-24 ENCOUNTER — Other Ambulatory Visit: Payer: Self-pay | Admitting: Family Medicine

## 2023-01-24 DIAGNOSIS — M19041 Primary osteoarthritis, right hand: Secondary | ICD-10-CM

## 2023-01-30 DIAGNOSIS — M545 Low back pain, unspecified: Secondary | ICD-10-CM | POA: Diagnosis not present

## 2023-01-30 DIAGNOSIS — M791 Myalgia, unspecified site: Secondary | ICD-10-CM | POA: Diagnosis not present

## 2023-02-07 ENCOUNTER — Other Ambulatory Visit: Payer: Self-pay | Admitting: Family Medicine

## 2023-02-07 DIAGNOSIS — E782 Mixed hyperlipidemia: Secondary | ICD-10-CM

## 2023-02-26 DIAGNOSIS — H903 Sensorineural hearing loss, bilateral: Secondary | ICD-10-CM | POA: Diagnosis not present

## 2023-02-26 DIAGNOSIS — H9313 Tinnitus, bilateral: Secondary | ICD-10-CM | POA: Diagnosis not present

## 2023-03-02 DIAGNOSIS — R7303 Prediabetes: Secondary | ICD-10-CM | POA: Diagnosis not present

## 2023-03-02 DIAGNOSIS — F410 Panic disorder [episodic paroxysmal anxiety] without agoraphobia: Secondary | ICD-10-CM | POA: Diagnosis not present

## 2023-03-02 DIAGNOSIS — R202 Paresthesia of skin: Secondary | ICD-10-CM | POA: Diagnosis not present

## 2023-03-02 DIAGNOSIS — E785 Hyperlipidemia, unspecified: Secondary | ICD-10-CM | POA: Diagnosis not present

## 2023-03-02 DIAGNOSIS — R079 Chest pain, unspecified: Secondary | ICD-10-CM | POA: Diagnosis not present

## 2023-03-02 DIAGNOSIS — R42 Dizziness and giddiness: Secondary | ICD-10-CM | POA: Diagnosis not present

## 2023-03-07 ENCOUNTER — Telehealth: Payer: Self-pay

## 2023-03-07 NOTE — Telephone Encounter (Signed)
Transition Care Management Follow-up Telephone Call Date of discharge and from where: 03/02/2023 [facility] How have you been since you were released from the hospital? Patient stated she is feeling better. Any questions or concerns? No  Items Reviewed: Did the pt receive and understand the discharge instructions provided? Yes  Medications obtained and verified?  No medication prescribed. Other? No  Any new allergies since your discharge? No  Dietary orders reviewed? Yes Do you have support at home? Yes   Follow up appointments reviewed:  PCP Hospital f/u appt confirmed?  Patient stated she has an appointment scheduled the end of August.  Scheduled to see  on  @ . Specialist Hospital f/u appt confirmed? No  Scheduled to see  on  @ . Are transportation arrangements needed? No  If their condition worsens, is the pt aware to call PCP or go to the Emergency Dept.? Yes Was the patient provided with contact information for the PCP's office or ED? Yes Was to pt encouraged to call back with questions or concerns? Yes  Gustave Lindeman [facility]  [facility] Population Health Community Resource Care Guide   [email or web address]  ?? [phone]   Website: [email or web address]  [email or web address]

## 2023-03-12 IMAGING — US US BREAST*L* LIMITED INC AXILLA
1 series · 8 of 8 positions shown · non-contrast
Comparison: Previous exam(s).

CLINICAL DATA: 75-year-old female presenting for first six-month
follow-up of a probably benign left breast mass.

EXAM:
DIGITAL DIAGNOSTIC UNILATERAL LEFT MAMMOGRAM WITH TOMOSYNTHESIS AND
CAD; ULTRASOUND LEFT BREAST LIMITED
TECHNIQUE: Left digital diagnostic mammography and breast tomosynthesis was
performed. The images were evaluated with computer-aided detection.;
Targeted ultrasound examination of the left breast was performed

[Series 1: us breast*left* limited inc axilla · 0.07mm/px · 8 of 8 slices shown]
[im 1/8]
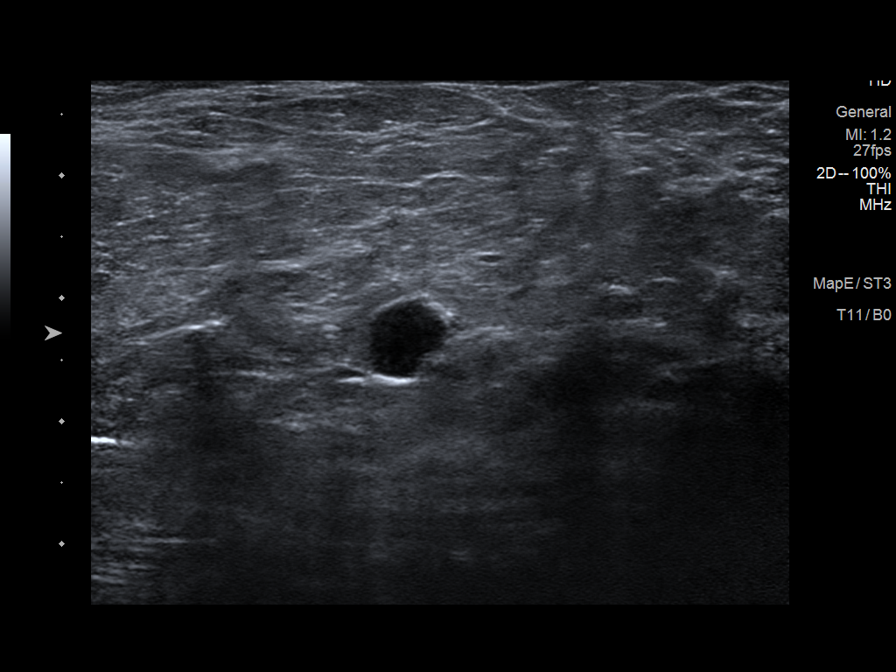
[im 2/8]
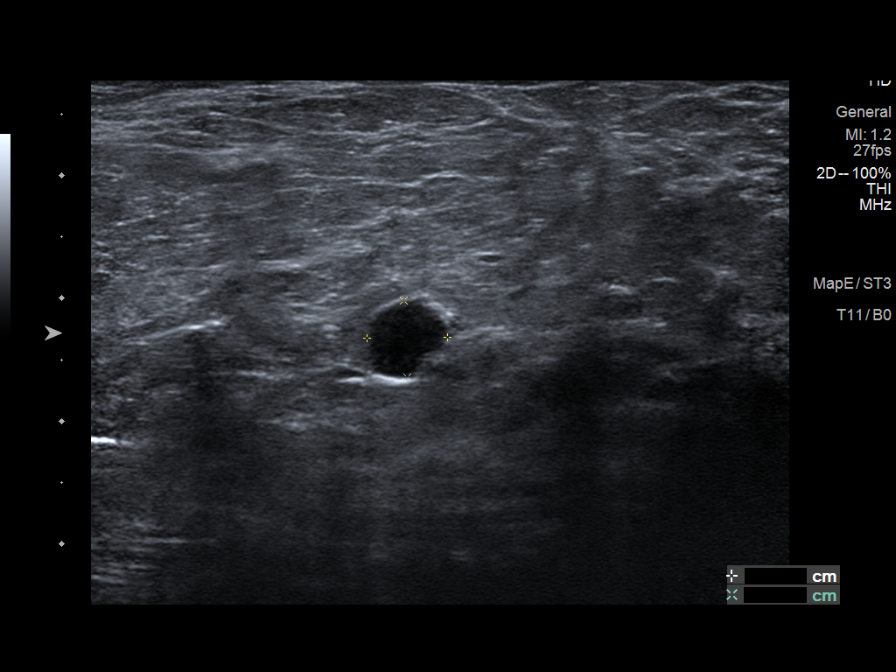
[im 3/8]
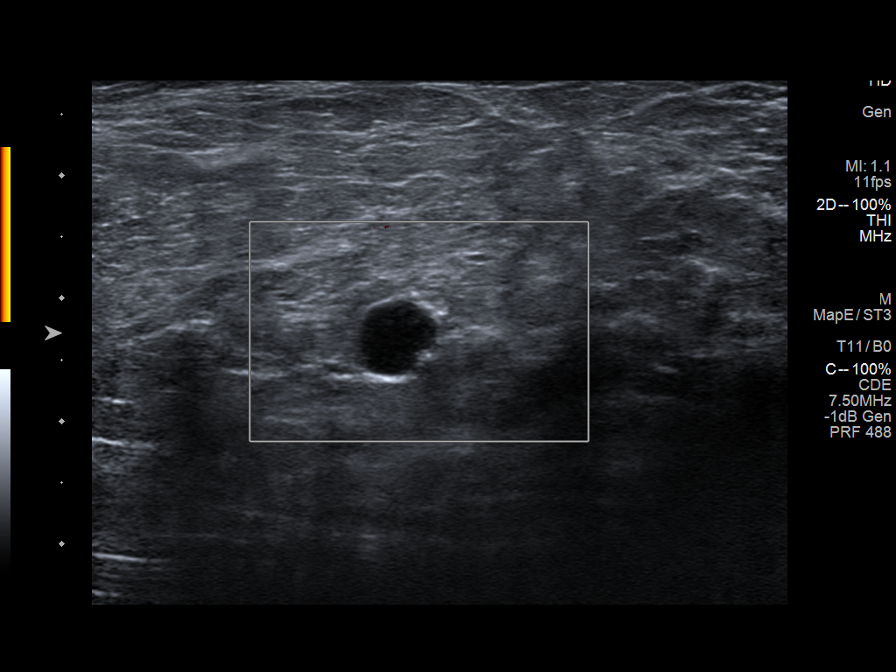
[im 4/8]
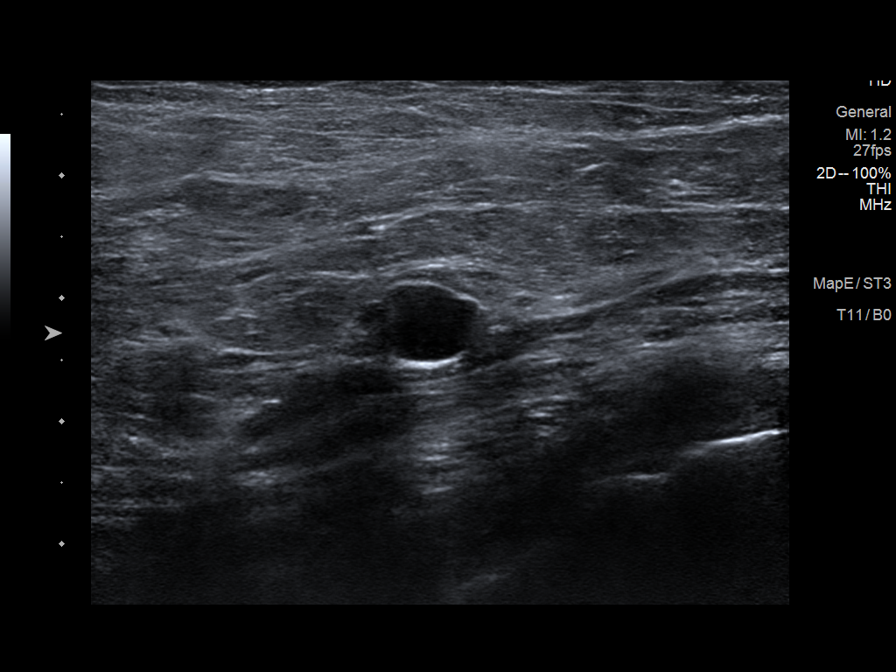
[im 5/8]
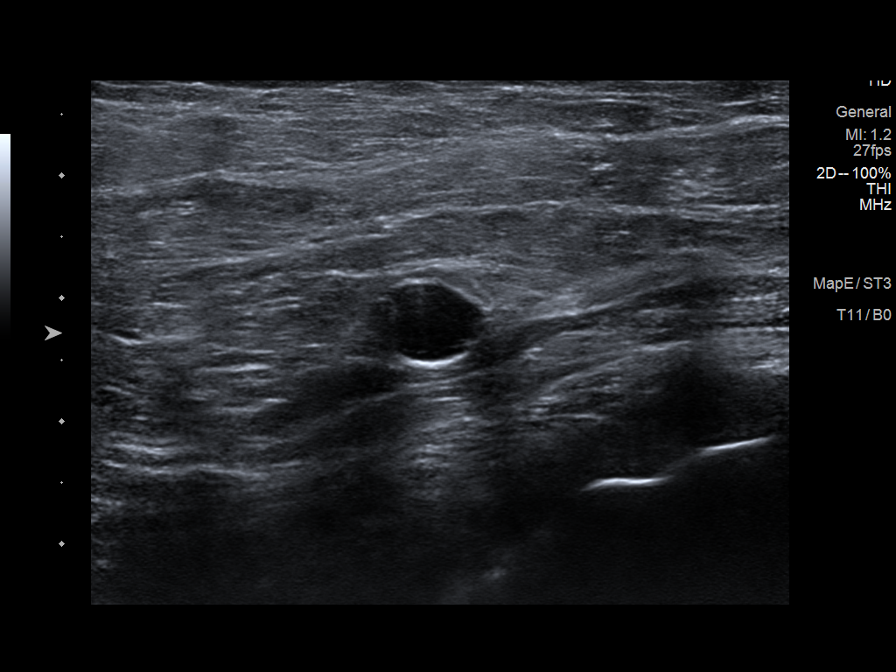
[im 6/8]
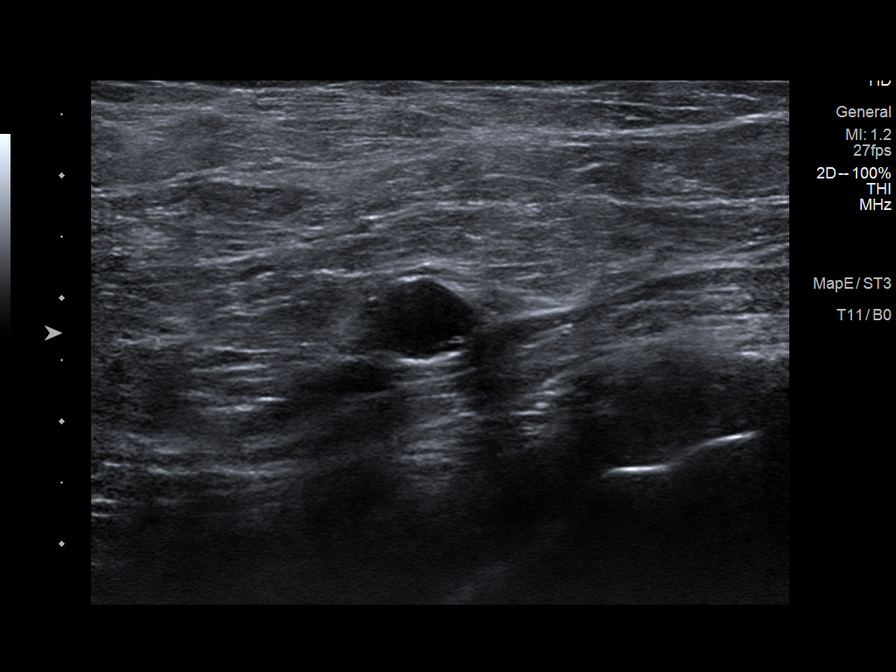
[im 7/8]
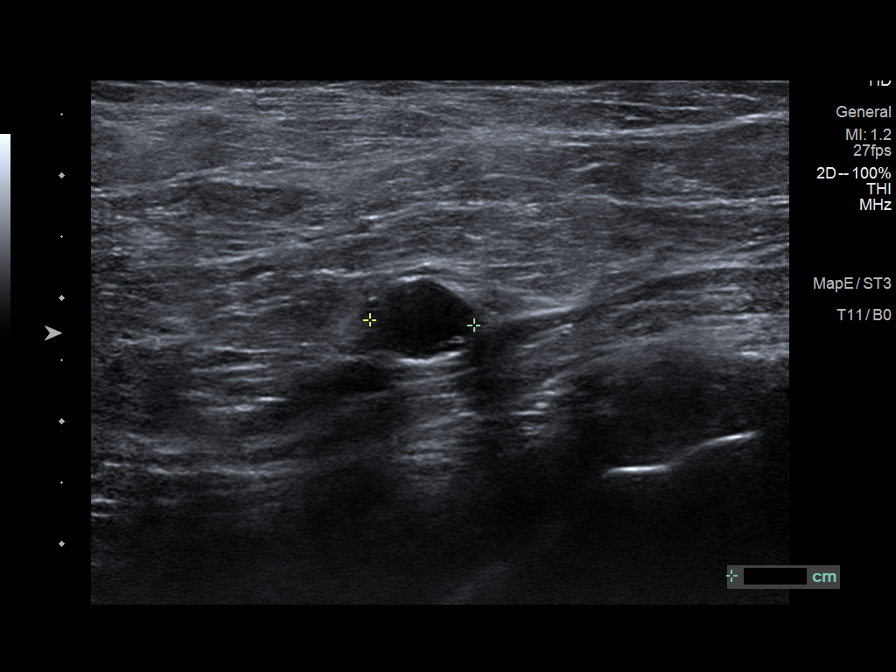
[im 8/8]
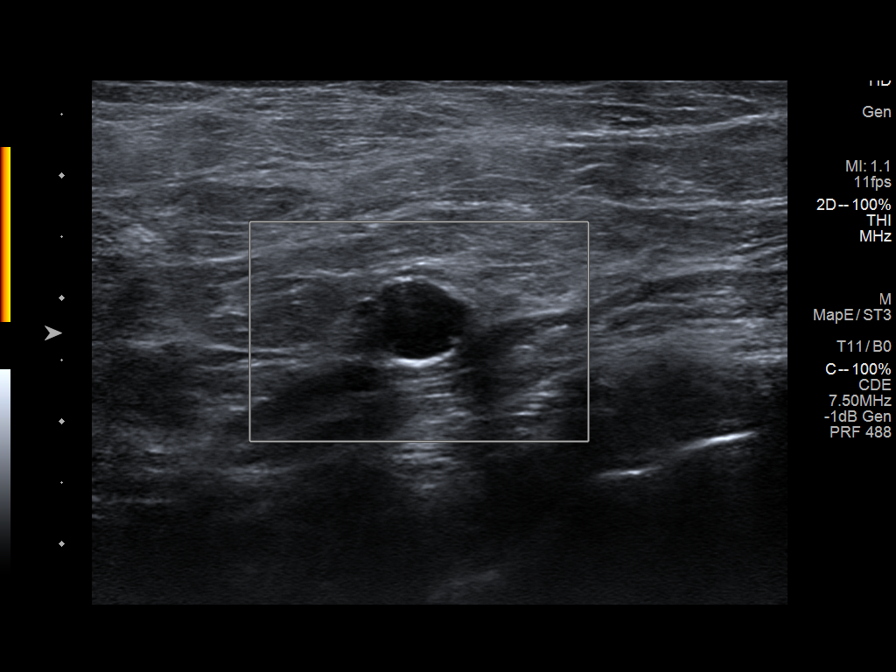

[8 of 8 positions shown; findings below may reference images not displayed]

ACR Breast Density Category c: The breast tissue is heterogeneously
dense, which may obscure small masses.
FINDINGS: An oval, circumscribed hypoechoic mass in the central lateral left
breast is mammographically stable. No new or suspicious findings in
the remainder of the left breast.

Targeted ultrasound is performed, showing stable appearance of an
oval, circumscribed hypoechoic mass at the [DATE] position 6 cm from
the nipple. Today it measures 8 x 7 x 6 mm (previously 7 x 7 x 6
mm).
IMPRESSION: Stable, probably benign left breast mass. Recommend continued
short-term imaging follow-up.

RECOMMENDATION:
Bilateral diagnostic mammogram and left breast ultrasound in 6
months.

I have discussed the findings and recommendations with the patient.
If applicable, a reminder letter will be sent to the patient
regarding the next appointment.

BI-RADS CATEGORY  3: Probably benign.

## 2023-03-18 ENCOUNTER — Other Ambulatory Visit: Payer: Self-pay | Admitting: Family Medicine

## 2023-03-18 DIAGNOSIS — I10 Essential (primary) hypertension: Secondary | ICD-10-CM

## 2023-03-28 ENCOUNTER — Other Ambulatory Visit: Payer: Self-pay | Admitting: Obstetrics & Gynecology

## 2023-03-28 DIAGNOSIS — Z1231 Encounter for screening mammogram for malignant neoplasm of breast: Secondary | ICD-10-CM

## 2023-04-04 ENCOUNTER — Other Ambulatory Visit: Payer: Self-pay | Admitting: Family Medicine

## 2023-04-04 DIAGNOSIS — I10 Essential (primary) hypertension: Secondary | ICD-10-CM

## 2023-04-04 NOTE — Assessment & Plan Note (Signed)
Stable

## 2023-04-04 NOTE — Assessment & Plan Note (Signed)
Well controlled.  No changes to medicines. Fenofibrate 160 mg daily, rosuvastatin 5 mg daily,  Continue to work on eating a healthy diet and exercise.  Labs drawn today.

## 2023-04-04 NOTE — Assessment & Plan Note (Signed)
Control: good Recommend check sugars fasting daily. Recommend check feet daily. Recommend annual eye exams. Medicines: none Continue to work on eating a healthy diet and exercise.  Labs drawn today.    

## 2023-04-04 NOTE — Assessment & Plan Note (Signed)
Well controlled.  No changes to medicines. Telmisartan 80 mg daily, amlodipine 5 mg daily. Continue to work on eating a healthy diet and exercise.  Labs drawn today.

## 2023-04-04 NOTE — Progress Notes (Signed)
Subjective:  Patient ID: Denise [name], female    DOB: [birth year]  Age: 77 y.o. MRN: [id number]  Chief Complaint  Patient presents with   Medical Management of Chronic Issues    HPI   Diabetes:  Complications: hyperlipidemia, hypertension Glucose checking: not checking Glucose logs: No Hypoglycemia:No Most recent A1C: 5.9 Current medications: None- Patient is diet controlled. Checking feet daily.   Hyperlipidemia: Current medications: Fenofibrate 160mg  1 tablet once daily, rosuvastatin 5 mg once daily.  NO side effects.   Hypertension: Current medications: Amlodipine 5mg  1 tablet daily, Telmisartan 80mg  1 tablet daily.   Back pain:  evaluated by Dr. Netta Corrigan at [facility] and given back injection.    Diet:fairly healthy Exercise: still walking.     04/05/2023    8:26 AM 12/18/2022    8:20 AM 06/13/2022    9:10 AM 12/09/2021    8:48 AM 12/06/2020    8:21 AM  Depression screen PHQ 2/9  Decreased Interest 0 0 0 0 0  Down, Depressed, Hopeless 0 0 0 0 0  PHQ - 2 Score 0 0 0 0 0  Altered sleeping 0 0     Tired, decreased energy 1 1     Change in appetite 0 0     Feeling bad or failure about yourself  0 0     Trouble concentrating 0 0     Moving slowly or fidgety/restless 0 0     Suicidal thoughts 0 0     PHQ-9 Score 1 1     Difficult doing work/chores Not difficult at all Not difficult at all           04/05/2023    8:26 AM  Fall Risk   Falls in the past year? 0  Number falls in past yr: 0  Injury with Fall? 0  Risk for fall due to : No Fall Risks  Follow up Falls evaluation completed;Falls prevention discussed    Patient Care Team: Sheral Pfahler, Fritzi Mandes, MD as PCP - General (Family Medicine)   Review of Systems  Constitutional:  Negative for chills, fatigue and fever.  HENT:  Negative for congestion, ear pain, rhinorrhea and sore throat.   Respiratory:  Negative for cough and shortness of breath.   Cardiovascular:  Negative for chest pain.   Gastrointestinal:  Positive for abdominal pain and constipation. Negative for diarrhea, nausea and vomiting.  Genitourinary:  Negative for dysuria and urgency.  Musculoskeletal:  Positive for back pain. Negative for myalgias.  Neurological:  Negative for dizziness, weakness, light-headedness and headaches.  Psychiatric/Behavioral:  Negative for dysphoric mood. The patient is not nervous/anxious.     Current Outpatient Medications on File Prior to Visit  Medication Sig Dispense Refill   amLODipine (NORVASC) 5 MG tablet TAKE 1 TABLET (5 MG TOTAL) BY MOUTH DAILY. 90 tablet 1   Calcium Carb-Cholecalciferol (CALCIUM 600 + D PO) Take 1 tablet by mouth daily.     celecoxib (CELEBREX) 100 MG capsule TAKE 1 CAPSULE BY MOUTH EVERY DAY 90 capsule 1   fenofibrate 160 MG tablet TAKE 1 TABLET BY MOUTH EVERY DAY 90 tablet 0   mometasone (ELOCON) 0.1 % cream mometasone 0.1 % topical cream  APPLY SPARINGLY TO AFFECTED AREA EVERY DAY     Multiple Vitamin (MULTIVITAMIN) tablet Take 1 tablet by mouth daily.     rosuvastatin (CRESTOR) 5 MG tablet TAKE 1 TABLET (5 MG TOTAL) BY MOUTH DAILY. 90 tablet 1   telmisartan (MICARDIS) 80 MG tablet TAKE 1  TABLET BY MOUTH EVERY DAY 90 tablet 0   vitamin C (ASCORBIC ACID) 500 MG tablet Take 500 mg by mouth daily.     No current facility-administered medications on file prior to visit.   Past Medical History:  Diagnosis Date   Aortic atherosclerosis (HCC) 2020   Arthritis    Constipation    Diabetes mellitus without complication (HCC)    diet controlled   HTN (hypertension) 06/15/2015   Pneumonia    Past Surgical History:  Procedure Laterality Date   ANKLE SURGERY     BREAST LUMPECTOMY     5 times   CARPAL TUNNEL RELEASE     10/2015   CATARACT EXTRACTION     CHOLECYSTECTOMY     METATARSAL OSTEOTOMY Left 12/09/2020   Procedure: Left second metatarsal phalangeal joint debridement, 2-3 weil osteotomies;  Surgeon: Toni Arthurs, MD;  Location: [facility];  Service: Orthopedics;  Laterality: Left;  60 mins [facility] vs General   NASAL SINUS SURGERY     right knee arthroscopic     ROTATOR CUFF REPAIR     TOTAL KNEE ARTHROPLASTY Right 03/28/2017   Procedure: RIGHT TOTAL KNEE ARTHROPLASTY;  Surgeon: Ranee Gosselin, MD;  Location: [facility];  Service: Orthopedics;  Laterality: Right;   TUBAL LIGATION      Family History  Problem Relation Age of Onset   Liver disease Father    Cirrhosis Father    Bone cancer Mother    Breast cancer Other        aunts   Colon cancer Neg Hx    Social History   Socioeconomic History   Marital status: Married    Spouse name: Not on file   Number of children: Not on file   Years of education: Not on file   Highest education level: 12th grade  Occupational History   Occupation: Self Employed  Tobacco Use   Smoking status: Never   Smokeless tobacco: Never  Vaping Use   Vaping status: Never Used  Substance and Sexual Activity   Alcohol use: No   Drug use: No   Sexual activity: Yes  Other Topics Concern   Not on file  Social History Narrative   Not on file   Social Determinants of Health   Financial Resource Strain: Low Risk  (12/14/2022)   Overall Financial Resource Strain (CARDIA)    Difficulty of Paying Living Expenses: Not hard at all  Food Insecurity: No Food Insecurity (12/14/2022)   Hunger Vital Sign    Worried About Running Out of Food in the Last Year: Never true    Ran Out of Food in the Last Year: Never true  Transportation Needs: No Transportation Needs (12/14/2022)   PRAPARE - Administrator, Civil Service (Medical): No    Lack of Transportation (Non-Medical): No  Physical Activity: Insufficiently Active (12/14/2022)   Exercise Vital Sign    Days of Exercise per Week: 2 days    Minutes of Exercise per Session: 50 min  Stress: No Stress Concern Present (12/14/2022)   [employer] of Occupational Health - Occupational Stress Questionnaire    Feeling of Stress :  Not at all  Social Connections: Socially Integrated (12/14/2022)   Social Connection and Isolation Panel [NHANES]    Frequency of Communication with Friends and Family: More than three times a week    Frequency of Social Gatherings with Friends and Family: Twice a week    Attends Religious Services: More than 4 times  per year    Active Member of Clubs or Organizations: No    Attends Engineer, structural: More than 4 times per year    Marital Status: Married    Objective:  BP 120/60   Pulse 78   Temp (!) 97 F (36.1 C)   Resp 18   Ht 5\' 2"  (1.575 m)   Wt 147 lb 6.4 oz (66.9 kg)   BMI 26.96 kg/m      04/05/2023    8:24 AM 12/18/2022    8:16 AM 06/13/2022    8:09 AM  BP/Weight  Systolic BP 120 128 110  Diastolic BP 60 60 60  Wt. (Lbs) 147.4 153.4 152.6  BMI 26.96 kg/m2 28.06 kg/m2 27.91 kg/m2    Physical Exam Vitals reviewed.  Constitutional:      Appearance: Normal appearance. She is normal weight.  Neck:     Vascular: No carotid bruit.  Cardiovascular:     Rate and Rhythm: Normal rate and regular rhythm.     Heart sounds: Normal heart sounds.  Pulmonary:     Effort: Pulmonary effort is normal. No respiratory distress.     Breath sounds: Normal breath sounds.  Abdominal:     General: Abdomen is flat. Bowel sounds are normal.     Palpations: Abdomen is soft.     Tenderness: There is no abdominal tenderness.  Neurological:     Mental Status: She is alert and oriented to person, place, and time.  Psychiatric:        Mood and Affect: Mood normal.        Behavior: Behavior normal.     Diabetic Foot Exam - Simple   Simple Foot Form Diabetic Foot exam was performed with the following findings: Yes 04/05/2023  9:02 AM  Visual Inspection No deformities, no ulcerations, no other skin breakdown bilaterally: Yes Sensation Testing Intact to touch and monofilament testing bilaterally: Yes Pulse Check Posterior Tibialis and Dorsalis pulse intact bilaterally:  Yes Comments      Lab Results  Component Value Date   WBC 6.2 12/18/2022   HGB 12.4 12/18/2022   HCT 38.2 12/18/2022   PLT 289 12/18/2022   GLUCOSE 104 (H) 04/05/2023   CHOL 133 12/18/2022   TRIG 79 12/18/2022   HDL 53 12/18/2022   LDLCALC 64 12/18/2022   ALT 9 04/05/2023   AST 21 04/05/2023   NA 142 04/05/2023   K 4.9 04/05/2023   CL 104 04/05/2023   CREATININE 1.26 (H) 04/05/2023   BUN 25 04/05/2023   CO2 22 04/05/2023   TSH 2.650 12/18/2022   INR 0.94 03/20/2017   HGBA1C 6.0 (H) 04/05/2023   MICROALBUR 30 12/06/2020      Assessment & Plan:    Hypertension associated with diabetes (HCC) Assessment & Plan: Well controlled.  No changes to medicines. Telmisartan 80 mg daily, amlodipine 5 mg daily. No medications for diabetes  Continue to work on eating a healthy diet and exercise.  Labs drawn today.    Orders: -     Comprehensive metabolic panel  Dyslipidemia associated with type 2 diabetes mellitus (HCC) Assessment & Plan: Control: good Recommend check sugars fasting daily. Recommend check feet daily. Recommend annual eye exams. Medicines: none Continue to work on eating a healthy diet and exercise.  Labs drawn today.     Orders: -     Hemoglobin A1c  Stage 3a chronic kidney disease (HCC) Assessment & Plan: Stable.    Mixed hyperlipidemia Assessment & Plan: Well  controlled.  No changes to medicines. Fenofibrate 160 mg daily, rosuvastatin 5 mg daily,  Continue to work on eating a healthy diet and exercise.  Labs drawn today.     Other orders -     [facility] CKD [facility]     No orders of the defined types were placed in this encounter.   Orders Placed This Encounter  Procedures   Comprehensive metabolic panel   Hemoglobin A1c   [facility] CKD [facility]     Follow-up: Return in about 4 months (around 08/05/2023) for chronic follow up.   I,Marla I Leal-Borjas,acting as a scribe for Blane Ohara, MD.,have documented all relevant  documentation on the behalf of Blane Ohara, MD,as directed by  Blane Ohara, MD while in the presence of Blane Ohara, MD.   An After Visit Summary was printed and given to the patient.  Blane Ohara, MD [facility] [phone]

## 2023-04-05 ENCOUNTER — Telehealth: Payer: Self-pay

## 2023-04-05 ENCOUNTER — Ambulatory Visit (INDEPENDENT_AMBULATORY_CARE_PROVIDER_SITE_OTHER): Payer: Medicare Other | Admitting: Family Medicine

## 2023-04-05 ENCOUNTER — Encounter: Payer: Self-pay | Admitting: Family Medicine

## 2023-04-05 VITALS — BP 120/60 | HR 78 | Temp 97.0°F | Resp 18 | Ht 62.0 in | Wt 147.4 lb

## 2023-04-05 DIAGNOSIS — N1831 Chronic kidney disease, stage 3a: Secondary | ICD-10-CM | POA: Diagnosis not present

## 2023-04-05 DIAGNOSIS — E1169 Type 2 diabetes mellitus with other specified complication: Secondary | ICD-10-CM | POA: Diagnosis not present

## 2023-04-05 DIAGNOSIS — I152 Hypertension secondary to endocrine disorders: Secondary | ICD-10-CM

## 2023-04-05 DIAGNOSIS — E1159 Type 2 diabetes mellitus with other circulatory complications: Secondary | ICD-10-CM

## 2023-04-05 DIAGNOSIS — E782 Mixed hyperlipidemia: Secondary | ICD-10-CM | POA: Diagnosis not present

## 2023-04-05 DIAGNOSIS — E785 Hyperlipidemia, unspecified: Secondary | ICD-10-CM | POA: Diagnosis not present

## 2023-04-05 NOTE — Telephone Encounter (Signed)
Contacted Denise [name] to schedule their annual wellness visit. Patient declined to schedule AWV at this time.

## 2023-04-06 LAB — COMPREHENSIVE METABOLIC PANEL
ALT: 9 IU/L (ref 0–32)
AST: 21 IU/L (ref 0–40)
Albumin: 4.3 g/dL (ref 3.8–4.8)
Alkaline Phosphatase: 52 IU/L (ref 44–121)
BUN/Creatinine Ratio: 20 (ref 12–28)
BUN: 25 mg/dL (ref 8–27)
Bilirubin Total: 0.5 mg/dL (ref 0.0–1.2)
CO2: 22 mmol/L (ref 20–29)
Calcium: 9.6 mg/dL (ref 8.7–10.3)
Chloride: 104 mmol/L (ref 96–106)
Creatinine, Ser: 1.26 mg/dL — ABNORMAL HIGH (ref 0.57–1.00)
Globulin, Total: 2.3 g/dL (ref 1.5–4.5)
Glucose: 104 mg/dL — ABNORMAL HIGH (ref 70–99)
Potassium: 4.9 mmol/L (ref 3.5–5.2)
Sodium: 142 mmol/L (ref 134–144)
Total Protein: 6.6 g/dL (ref 6.0–8.5)
eGFR: 44 mL/min/{1.73_m2} — ABNORMAL LOW (ref >59)

## 2023-04-06 LAB — HEMOGLOBIN A1C
Est. average glucose Bld gHb Est-mCnc: 126 mg/dL
Hgb A1c MFr Bld: 6 % — ABNORMAL HIGH (ref 4.8–5.6)

## 2023-04-06 LAB — LITHOLINK CKD PROGRAM

## 2023-04-24 ENCOUNTER — Ambulatory Visit (INDEPENDENT_AMBULATORY_CARE_PROVIDER_SITE_OTHER): Payer: Medicare Other

## 2023-04-24 DIAGNOSIS — Z23 Encounter for immunization: Secondary | ICD-10-CM

## 2023-05-05 ENCOUNTER — Other Ambulatory Visit: Payer: Self-pay | Admitting: Family Medicine

## 2023-05-05 DIAGNOSIS — E782 Mixed hyperlipidemia: Secondary | ICD-10-CM

## 2023-05-06 ENCOUNTER — Other Ambulatory Visit: Payer: Self-pay | Admitting: Family Medicine

## 2023-05-07 DIAGNOSIS — H353132 Nonexudative age-related macular degeneration, bilateral, intermediate dry stage: Secondary | ICD-10-CM | POA: Diagnosis not present

## 2023-05-07 DIAGNOSIS — H524 Presbyopia: Secondary | ICD-10-CM | POA: Diagnosis not present

## 2023-05-08 ENCOUNTER — Ambulatory Visit
Admission: RE | Admit: 2023-05-08 | Discharge: 2023-05-08 | Disposition: A | Discharge status: Home / Self Care | Payer: Medicare Other | Source: Ambulatory Visit | Attending: Obstetrics & Gynecology | Admitting: Obstetrics & Gynecology

## 2023-05-08 ENCOUNTER — Ambulatory Visit: Payer: Medicare Other

## 2023-05-08 ENCOUNTER — Ambulatory Visit (INDEPENDENT_AMBULATORY_CARE_PROVIDER_SITE_OTHER): Payer: Medicare Other

## 2023-05-08 DIAGNOSIS — Z23 Encounter for immunization: Secondary | ICD-10-CM

## 2023-05-08 DIAGNOSIS — Z1231 Encounter for screening mammogram for malignant neoplasm of breast: Secondary | ICD-10-CM | POA: Diagnosis not present

## 2023-05-11 DIAGNOSIS — M2022 Hallux rigidus, left foot: Secondary | ICD-10-CM | POA: Diagnosis not present

## 2023-05-11 DIAGNOSIS — M67471 Ganglion, right ankle and foot: Secondary | ICD-10-CM | POA: Diagnosis not present

## 2023-05-11 DIAGNOSIS — M2011 Hallux valgus (acquired), right foot: Secondary | ICD-10-CM | POA: Diagnosis not present

## 2023-05-11 DIAGNOSIS — L608 Other nail disorders: Secondary | ICD-10-CM | POA: Diagnosis not present

## 2023-05-14 DIAGNOSIS — M7918 Myalgia, other site: Secondary | ICD-10-CM | POA: Diagnosis not present

## 2023-05-14 DIAGNOSIS — M545 Low back pain, unspecified: Secondary | ICD-10-CM | POA: Diagnosis not present

## 2023-06-16 ENCOUNTER — Other Ambulatory Visit: Payer: Self-pay | Admitting: Family Medicine

## 2023-06-16 DIAGNOSIS — I10 Essential (primary) hypertension: Secondary | ICD-10-CM

## 2023-07-09 NOTE — Progress Notes (Unsigned)
Subjective:  Patient ID: Denise [name], female    DOB: Sep 05, 1945  Age: 77 y.o. MRN: [id number]  No chief complaint on file.   HPI   Diabetes:  Complications: hyperlipidemia, hypertension Glucose checking: not checking Glucose logs: No Hypoglycemia:No Most recent A1C: 6.0% Current medications: None- Patient is diet controlled. Checking feet daily.   Hyperlipidemia: Current medications: Fenofibrate 160mg  1 tablet once daily, rosuvastatin 5 mg once daily.  NO side effects.   Hypertension: Current medications: Amlodipine 5mg  1 tablet daily, Telmisartan 80mg  1 tablet daily.   Back pain:  evaluated by Dr. Netta Corrigan at [facility] and given back injection.    Diet:fairly healthy Exercise: still walking.     04/05/2023    8:26 AM 12/18/2022    8:20 AM 06/13/2022    9:10 AM 12/09/2021    8:48 AM 12/06/2020    8:21 AM  Depression screen PHQ 2/9  Decreased Interest 0 0 0 0 0  Down, Depressed, Hopeless 0 0 0 0 0  PHQ - 2 Score 0 0 0 0 0  Altered sleeping 0 0     Tired, decreased energy 1 1     Change in appetite 0 0     Feeling bad or failure about yourself  0 0     Trouble concentrating 0 0     Moving slowly or fidgety/restless 0 0     Suicidal thoughts 0 0     PHQ-9 Score 1 1     Difficult doing work/chores Not difficult at all Not difficult at all           04/05/2023    8:26 AM  Fall Risk   Falls in the past year? 0  Number falls in past yr: 0  Injury with Fall? 0  Risk for fall due to : No Fall Risks  Follow up Falls evaluation completed;Falls prevention discussed    Patient Care Team: Cox, Fritzi Mandes, MD as PCP - General (Family Medicine)   Review of Systems  Current Outpatient Medications on File Prior to Visit  Medication Sig Dispense Refill   amLODipine (NORVASC) 5 MG tablet TAKE 1 TABLET (5 MG TOTAL) BY MOUTH DAILY. 90 tablet 1   Calcium Carb-Cholecalciferol (CALCIUM 600 + D PO) Take 1 tablet by mouth daily.     celecoxib (CELEBREX) 100 MG capsule  TAKE 1 CAPSULE BY MOUTH EVERY DAY 90 capsule 1   fenofibrate 160 MG tablet TAKE 1 TABLET BY MOUTH EVERY DAY 90 tablet 0   mometasone (ELOCON) 0.1 % cream mometasone 0.1 % topical cream  APPLY SPARINGLY TO AFFECTED AREA EVERY DAY     Multiple Vitamin (MULTIVITAMIN) tablet Take 1 tablet by mouth daily.     rosuvastatin (CRESTOR) 5 MG tablet TAKE 1 TABLET (5 MG TOTAL) BY MOUTH DAILY. 90 tablet 1   telmisartan (MICARDIS) 80 MG tablet TAKE 1 TABLET BY MOUTH EVERY DAY 90 tablet 0   vitamin C (ASCORBIC ACID) 500 MG tablet Take 500 mg by mouth daily.     No current facility-administered medications on file prior to visit.   Past Medical History:  Diagnosis Date   Aortic atherosclerosis (HCC) 2020   Arthritis    Constipation    Diabetes mellitus without complication (HCC)    diet controlled   HTN (hypertension) 06/15/2015   Pneumonia    Past Surgical History:  Procedure Laterality Date   ANKLE SURGERY     BREAST LUMPECTOMY     5 times   CARPAL  TUNNEL RELEASE     10/2015   CATARACT EXTRACTION     CHOLECYSTECTOMY     METATARSAL OSTEOTOMY Left 12/09/2020   Procedure: Left second metatarsal phalangeal joint debridement, 2-3 weil osteotomies;  Surgeon: Toni Arthurs, MD;  Location: [facility];  Service: Orthopedics;  Laterality: Left;  60 mins [facility] vs General   NASAL SINUS SURGERY     right knee arthroscopic     ROTATOR CUFF REPAIR     TOTAL KNEE ARTHROPLASTY Right 03/28/2017   Procedure: RIGHT TOTAL KNEE ARTHROPLASTY;  Surgeon: Ranee Gosselin, MD;  Location: [facility];  Service: Orthopedics;  Laterality: Right;   TUBAL LIGATION      Family History  Problem Relation Age of Onset   Liver disease Father    Cirrhosis Father    Bone cancer Mother    Breast cancer Other        aunts   Colon cancer Neg Hx    Social History   Socioeconomic History   Marital status: Married    Spouse name: Not on file   Number of children: Not on file   Years of education: Not on file    Highest education level: 12th grade  Occupational History   Occupation: Self Employed  Tobacco Use   Smoking status: Never   Smokeless tobacco: Never  Vaping Use   Vaping status: Never Used  Substance and Sexual Activity   Alcohol use: No   Drug use: No   Sexual activity: Yes  Other Topics Concern   Not on file  Social History Narrative   Not on file   Social Determinants of Health   Financial Resource Strain: Low Risk  (12/14/2022)   Overall Financial Resource Strain (CARDIA)    Difficulty of Paying Living Expenses: Not hard at all  Food Insecurity: No Food Insecurity (12/14/2022)   Hunger Vital Sign    Worried About Running Out of Food in the Last Year: Never true    Ran Out of Food in the Last Year: Never true  Transportation Needs: No Transportation Needs (12/14/2022)   PRAPARE - Administrator, Civil Service (Medical): No    Lack of Transportation (Non-Medical): No  Physical Activity: Insufficiently Active (12/14/2022)   Exercise Vital Sign    Days of Exercise per Week: 2 days    Minutes of Exercise per Session: 50 min  Stress: No Stress Concern Present (12/14/2022)   [employer] of Occupational Health - Occupational Stress Questionnaire    Feeling of Stress : Not at all  Social Connections: Moderately Integrated (12/14/2022)   Social Connection and Isolation Panel [NHANES]    Frequency of Communication with Friends and Family: More than three times a week    Frequency of Social Gatherings with Friends and Family: Twice a week    Attends Religious Services: More than 4 times per year    Active Member of [employer] or Organizations: No    Attends Engineer, structural: Not on file    Marital Status: Married    Objective:  There were no vitals taken for this visit.     04/05/2023    8:24 AM 12/18/2022    8:16 AM 06/13/2022    8:09 AM  BP/Weight  Systolic BP 120 128 110  Diastolic BP 60 60 60  Wt. (Lbs) 147.4 153.4 152.6  BMI 26.96 kg/m2 28.06  kg/m2 27.91 kg/m2    Physical Exam  Diabetic Foot Exam - Simple   No data  filed      Lab Results  Component Value Date   WBC 6.2 12/18/2022   HGB 12.4 12/18/2022   HCT 38.2 12/18/2022   PLT 289 12/18/2022   GLUCOSE 104 (H) 04/05/2023   CHOL 133 12/18/2022   TRIG 79 12/18/2022   HDL 53 12/18/2022   LDLCALC 64 12/18/2022   ALT 9 04/05/2023   AST 21 04/05/2023   NA 142 04/05/2023   K 4.9 04/05/2023   CL 104 04/05/2023   CREATININE 1.26 (H) 04/05/2023   BUN 25 04/05/2023   CO2 22 04/05/2023   TSH 2.650 12/18/2022   INR 0.94 03/20/2017   HGBA1C 6.0 (H) 04/05/2023   MICROALBUR 30 12/06/2020      Assessment & Plan:    Hypertension associated with diabetes (HCC)  Essential hypertension, benign  Mixed hyperlipidemia     No orders of the defined types were placed in this encounter.   No orders of the defined types were placed in this encounter.    Follow-up: No follow-ups on file.   I,Coree Riester I Leal-Borjas,acting as a scribe for Blane Ohara, MD.,have documented all relevant documentation on the behalf of Blane Ohara, MD,as directed by  Blane Ohara, MD while in the presence of Blane Ohara, MD.   An After Visit Summary was printed and given to the patient.  Blane Ohara, MD [facility] [phone]

## 2023-07-10 ENCOUNTER — Ambulatory Visit: Payer: Medicare Other | Admitting: Family Medicine

## 2023-07-10 DIAGNOSIS — I10 Essential (primary) hypertension: Secondary | ICD-10-CM

## 2023-07-10 DIAGNOSIS — E1159 Type 2 diabetes mellitus with other circulatory complications: Secondary | ICD-10-CM

## 2023-07-10 DIAGNOSIS — E782 Mixed hyperlipidemia: Secondary | ICD-10-CM

## 2023-07-10 NOTE — Progress Notes (Signed)
Subjective:  Patient ID: Denise [name], female    DOB: [birth year]  Age: 77 y.o. MRN: [id number]  Chief Complaint  Patient presents with   Medical Management of Chronic Issues    HPI   Diabetes:  Complications: hyperlipidemia, hypertension Glucose checking: not checking Glucose logs: No Hypoglycemia:No Most recent A1C: 6.0% Current medications: None- Patient is diet controlled. Checking feet daily.   Hyperlipidemia: Current medications: Fenofibrate 160mg  1 tablet once daily, rosuvastatin 5 mg once daily.  NO side effects.   Hypertension: Current medications: Amlodipine 5mg  1 tablet daily, Telmisartan 80mg  1 tablet daily.   Back pain:  evaluated by Dr. Netta Corrigan at [facility] . Not responded to ESIs. She needs to return to see if she needs to get surgery. Tylenol not helping much.   Diet:fairly healthy Exercise: still walking.     07/11/2023    4:47 PM 04/05/2023    8:26 AM 12/18/2022    8:20 AM 06/13/2022    9:10 AM 12/09/2021    8:48 AM  Depression screen PHQ 2/9  Decreased Interest 0 0 0 0 0  Down, Depressed, Hopeless 0 0 0 0 0  PHQ - 2 Score 0 0 0 0 0  Altered sleeping  0 0    Tired, decreased energy  1 1    Change in appetite  0 0    Feeling bad or failure about yourself   0 0    Trouble concentrating  0 0    Moving slowly or fidgety/restless  0 0    Suicidal thoughts  0 0    PHQ-9 Score  1 1    Difficult doing work/chores  Not difficult at all Not difficult at all          07/11/2023    4:47 PM  Fall Risk   Falls in the past year? 1  Number falls in past yr: 0  Injury with Fall? 0  Risk for fall due to : No Fall Risks  Follow up Falls evaluation completed    Patient Care Team: Blane Ohara, MD as PCP - General (Family Medicine) Roxanne Gates, OD (Optometry)   Review of Systems  Constitutional:  Negative for chills, fatigue and fever.  HENT:  Negative for congestion, ear pain, rhinorrhea and sore throat.   Respiratory:  Negative for cough  and shortness of breath.   Cardiovascular:  Negative for chest pain.  Gastrointestinal:  Positive for abdominal pain and constipation. Negative for diarrhea, nausea and vomiting.  Genitourinary:  Negative for dysuria and urgency.  Musculoskeletal:  Positive for back pain. Negative for myalgias.  Neurological:  Negative for dizziness, weakness, light-headedness and headaches.  Psychiatric/Behavioral:  Negative for dysphoric mood. The patient is not nervous/anxious.     Current Outpatient Medications on File Prior to Visit  Medication Sig Dispense Refill   amLODipine (NORVASC) 5 MG tablet TAKE 1 TABLET (5 MG TOTAL) BY MOUTH DAILY. 90 tablet 1   Calcium Carb-Cholecalciferol (CALCIUM 600 + D PO) Take 1 tablet by mouth daily.     celecoxib (CELEBREX) 100 MG capsule TAKE 1 CAPSULE BY MOUTH EVERY DAY 90 capsule 1   fenofibrate 160 MG tablet TAKE 1 TABLET BY MOUTH EVERY DAY 90 tablet 0   mometasone (ELOCON) 0.1 % cream mometasone 0.1 % topical cream  APPLY SPARINGLY TO AFFECTED AREA EVERY DAY     Multiple Vitamin (MULTIVITAMIN) tablet Take 1 tablet by mouth daily.     Multiple Vitamins-Minerals (PRESERVISION AREDS PO) Take by mouth.  rosuvastatin (CRESTOR) 5 MG tablet TAKE 1 TABLET (5 MG TOTAL) BY MOUTH DAILY. 90 tablet 1   telmisartan (MICARDIS) 80 MG tablet TAKE 1 TABLET BY MOUTH EVERY DAY 90 tablet 0   vitamin C (ASCORBIC ACID) 500 MG tablet Take 500 mg by mouth daily.     No current facility-administered medications on file prior to visit.   Past Medical History:  Diagnosis Date   Aortic atherosclerosis (HCC) 2020   Arthritis    Constipation    Diabetes mellitus without complication (HCC)    diet controlled   HTN (hypertension) 06/15/2015   Pneumonia    Past Surgical History:  Procedure Laterality Date   ANKLE SURGERY     BREAST LUMPECTOMY     5 times   CARPAL TUNNEL RELEASE     10/2015   CATARACT EXTRACTION     CHOLECYSTECTOMY     METATARSAL OSTEOTOMY Left 12/09/2020    Procedure: Left second metatarsal phalangeal joint debridement, 2-3 weil osteotomies;  Surgeon: Toni Arthurs, MD;  Location: [facility];  Service: Orthopedics;  Laterality: Left;  60 mins [facility] regional vs General   NASAL SINUS SURGERY     right knee arthroscopic     ROTATOR CUFF REPAIR     TOTAL KNEE ARTHROPLASTY Right 03/28/2017   Procedure: RIGHT TOTAL KNEE ARTHROPLASTY;  Surgeon: Ranee Gosselin, MD;  Location: [facility];  Service: Orthopedics;  Laterality: Right;   TUBAL LIGATION      Family History  Problem Relation Age of Onset   Liver disease Father    Cirrhosis Father    Bone cancer Mother    Breast cancer Other        aunts   Colon cancer Neg Hx    Social History   Socioeconomic History   Marital status: Married    Spouse name: Not on file   Number of children: Not on file   Years of education: Not on file   Highest education level: 12th grade  Occupational History   Occupation: Self Employed  Tobacco Use   Smoking status: Never   Smokeless tobacco: Never  Vaping Use   Vaping status: Never Used  Substance and Sexual Activity   Alcohol use: No   Drug use: No   Sexual activity: Yes  Other Topics Concern   Not on file  Social History Narrative   Not on file   Social Determinants of Health   Financial Resource Strain: Low Risk  (12/14/2022)   Overall Financial Resource Strain (CARDIA)    Difficulty of Paying Living Expenses: Not hard at all  Food Insecurity: No Food Insecurity (12/14/2022)   Hunger Vital Sign    Worried About Running Out of Food in the Last Year: Never true    Ran Out of Food in the Last Year: Never true  Transportation Needs: No Transportation Needs (12/14/2022)   PRAPARE - Administrator, Civil Service (Medical): No    Lack of Transportation (Non-Medical): No  Physical Activity: Insufficiently Active (12/14/2022)   Exercise Vital Sign    Days of Exercise per Week: 2 days    Minutes of Exercise per Session: 50 min  Stress:  No Stress Concern Present (12/14/2022)   [employer] of Occupational Health - Occupational Stress Questionnaire    Feeling of Stress : Not at all  Social Connections: Socially Integrated (07/11/2023)   Social Connection and Isolation Panel [NHANES]    Frequency of Communication with Friends and Family: More than three times  a week    Frequency of Social Gatherings with Friends and Family: Twice a week    Attends Religious Services: More than 4 times per year    Active Member of [employer] or Organizations: No    Attends Engineer, structural: More than 4 times per year    Marital Status: Married    Objective:  BP 128/60   Pulse 74   Temp 98.2 F (36.8 C)   Ht 5\' 2"  (1.575 m)   Wt 147 lb (66.7 kg)   SpO2 98%   BMI 26.89 kg/m      07/11/2023    3:44 PM 04/05/2023    8:24 AM 12/18/2022    8:16 AM  BP/Weight  Systolic BP 128 120 128  Diastolic BP 60 60 60  Wt. (Lbs) 147 147.4 153.4  BMI 26.89 kg/m2 26.96 kg/m2 28.06 kg/m2    Physical Exam  Diabetic Foot Exam - Simple   No data filed      Lab Results  Component Value Date   WBC 7.8 07/11/2023   HGB 11.7 07/11/2023   HCT 36.1 07/11/2023   PLT 319 07/11/2023   GLUCOSE 90 07/11/2023   CHOL 144 07/11/2023   TRIG 163 (H) 07/11/2023   HDL 55 07/11/2023   LDLCALC 62 07/11/2023   ALT 40 (H) 07/11/2023   AST 42 (H) 07/11/2023   NA 143 07/11/2023   K 4.7 07/11/2023   CL 105 07/11/2023   CREATININE 0.84 07/11/2023   BUN 27 07/11/2023   CO2 23 07/11/2023   TSH 2.650 12/18/2022   INR 0.94 03/20/2017   HGBA1C 6.2 (H) 07/11/2023   MICROALBUR 30 12/06/2020      Assessment & Plan:    Hypertension associated with diabetes (HCC) Assessment & Plan: Well controlled.  No changes to medicines. Telmisartan 80 mg daily, amlodipine 5 mg daily. No medications for diabetes  Continue to work on eating a healthy diet and exercise.  Labs drawn today.    Orders: -     Hemoglobin A1c  Mixed hyperlipidemia Assessment &  Plan: Well controlled.  No changes to medicines. Fenofibrate 160 mg daily, rosuvastatin 5 mg daily,  Continue to work on eating a healthy diet and exercise.  Labs drawn today.    Orders: -     Lipid panel  Essential hypertension, benign Assessment & Plan: Well controlled.  No changes to medicines. Telmisartan 80 mg daily, amlodipine 5 mg daily. Continue to work on eating a healthy diet and exercise.  Labs drawn today.   Orders: -     CBC with Differential/Platelet -     Comprehensive metabolic panel  Macular degeneration of both eyes, unspecified type  Lower abdominal pain Assessment & Plan: Sent Bentyl 10 mg before meals and before bed prn   Encounter for Medicare annual wellness exam Assessment & Plan: Things to do to keep yourself healthy  - Exercise at least 30-45 minutes a day, 3-4 days a week.  - Eat a low-fat diet with lots of fruits and vegetables, up to 7-9 servings per day.  - Seatbelts can save your life. Wear them always.  - Smoke detectors on every level of your home, check batteries every year.  - Eye Doctor - have an eye exam every 1-2 years  - Safe sex - if you may be exposed to STDs, use a condom.  - Alcohol -  If you drink, do it moderately, less than 2 drinks per day.  - Health Care Power  of Attorney. Choose someone to speak for you if you are not able.  - Depression is common in our stressful world.If you're feeling down or losing interest in things you normally enjoy, please come in for a visit.  - Violence - If anyone is threatening or hurting you, please call immediately.    Other orders -     Dicyclomine HCl; Before meals and before bed as needed for gas/abdominal pain.  Dispense: 120 capsule; Refill: 0      Meds ordered this encounter  Medications   dicyclomine (BENTYL) 10 MG capsule    Sig: Before meals and before bed as needed for gas/abdominal pain.    Dispense:  120 capsule    Refill:  0    Orders Placed This Encounter  Procedures    CBC with Differential/Platelet   Comprehensive metabolic panel   Hemoglobin A1c   Lipid panel     Follow-up: Return in 1 year (on 07/10/2024).   I,Lavonne Kinderman I Leal-Borjas,acting as a scribe for Blane Ohara, MD.,have documented all relevant documentation on the behalf of Blane Ohara, MD,as directed by  Blane Ohara, MD while in the presence of Blane Ohara, MD.   An After Visit Summary was printed and given to the patient.  Blane Ohara, MD [facility] [phone]

## 2023-07-11 ENCOUNTER — Encounter: Payer: Self-pay | Admitting: Family Medicine

## 2023-07-11 ENCOUNTER — Ambulatory Visit (INDEPENDENT_AMBULATORY_CARE_PROVIDER_SITE_OTHER): Payer: Medicare Other | Admitting: Family Medicine

## 2023-07-11 VITALS — BP 128/60 | HR 74 | Temp 98.2°F | Ht 62.0 in | Wt 147.0 lb

## 2023-07-11 DIAGNOSIS — M545 Low back pain, unspecified: Secondary | ICD-10-CM | POA: Diagnosis not present

## 2023-07-11 DIAGNOSIS — I10 Essential (primary) hypertension: Secondary | ICD-10-CM

## 2023-07-11 DIAGNOSIS — E782 Mixed hyperlipidemia: Secondary | ICD-10-CM | POA: Diagnosis not present

## 2023-07-11 DIAGNOSIS — Z0000 Encounter for general adult medical examination without abnormal findings: Secondary | ICD-10-CM

## 2023-07-11 DIAGNOSIS — I152 Hypertension secondary to endocrine disorders: Secondary | ICD-10-CM

## 2023-07-11 DIAGNOSIS — R1030 Lower abdominal pain, unspecified: Secondary | ICD-10-CM | POA: Diagnosis not present

## 2023-07-11 DIAGNOSIS — H3530 Unspecified macular degeneration: Secondary | ICD-10-CM

## 2023-07-11 DIAGNOSIS — E1159 Type 2 diabetes mellitus with other circulatory complications: Secondary | ICD-10-CM | POA: Diagnosis not present

## 2023-07-11 MED ORDER — DICYCLOMINE HCL 10 MG PO CAPS: ORAL_CAPSULE | ORAL | 0 refills | Status: DC

## 2023-07-11 NOTE — Progress Notes (Signed)
Subjective:   Denise [name] is a 77 y.o. female who presents for Medicare Annual (Subsequent) preventive examination.  Visit Complete: In person  Patient Medicare AWV questionnaire was completed by the patient on 07/11/2023; I have confirmed that all information answered by patient is correct and no changes since this date.  Cardiac Risk Factors include: advanced age (>79men, >34 women)  History of Present Illness The patient, with a history of hypertension, diabetes, hyperlipidemia, and chronic back pain, presents with worsening back pain and new onset stomach pain. She reports that her back pain has not improved despite receiving injections, and she is considering surgery. However, she has postponed further treatment due to her husband's recent health issues, including a car accident, kidney cancer, and foot surgery, which have increased her stress levels and responsibilities at home.  The patient also reports constant stomach pain, which she initially attributed to eating salads. Despite reducing her salad intake, the pain persists. She also reports frequent gas and constipation, for which she takes Colace and occasionally Phillips tablets. She has not noticed any improvement in her symptoms with these treatments.  The patient denies feeling depressed or anxious but admits to frequent crying due to the stress of her current situation. She has some support from friends but her family lives out of state. She denies any bladder symptoms.     Objective:    Today's Vitals   07/11/23 1544 07/11/23 1648  BP: 128/60   Pulse: 74   Temp: 98.2 F (36.8 C)   SpO2: 98%   Weight: 147 lb (66.7 kg)   Height: 5\' 2"  (1.575 m)   PainSc:  8    Body mass index is 26.89 kg/m.     07/11/2023    4:47 PM 06/13/2022    9:15 AM 12/09/2020    6:39 AM 12/02/2020    4:42 PM 03/28/2017    4:35 PM 03/20/2017    9:24 AM  Advanced Directives  Does Patient Have a Medical Advance Directive? No No No No No  No  Does patient want to make changes to medical advance directive? No - Patient declined       Would patient like information on creating a medical advance directive?  Yes (MAU/Ambulatory/Procedural Areas - Information given) No - Patient declined No - Patient declined No - Patient declined No - Patient declined    Current Medications (verified) Outpatient Encounter Medications as of 07/11/2023  Medication Sig   amLODipine (NORVASC) 5 MG tablet TAKE 1 TABLET (5 MG TOTAL) BY MOUTH DAILY.   Calcium Carb-Cholecalciferol (CALCIUM 600 + D PO) Take 1 tablet by mouth daily.   celecoxib (CELEBREX) 100 MG capsule TAKE 1 CAPSULE BY MOUTH EVERY DAY   dicyclomine (BENTYL) 10 MG capsule Before meals and before bed as needed for gas/abdominal pain.   fenofibrate 160 MG tablet TAKE 1 TABLET BY MOUTH EVERY DAY   mometasone (ELOCON) 0.1 % cream mometasone 0.1 % topical cream  APPLY SPARINGLY TO AFFECTED AREA EVERY DAY   Multiple Vitamin (MULTIVITAMIN) tablet Take 1 tablet by mouth daily.   Multiple Vitamins-Minerals (PRESERVISION AREDS PO) Take by mouth.   rosuvastatin (CRESTOR) 5 MG tablet TAKE 1 TABLET (5 MG TOTAL) BY MOUTH DAILY.   telmisartan (MICARDIS) 80 MG tablet TAKE 1 TABLET BY MOUTH EVERY DAY   vitamin C (ASCORBIC ACID) 500 MG tablet Take 500 mg by mouth daily.   No facility-administered encounter medications on file as of 07/11/2023.    Allergies (verified) Lisinopril  History: Past Medical History:  Diagnosis Date   Aortic atherosclerosis (HCC) 2020   Arthritis    Constipation    Diabetes mellitus without complication (HCC)    diet controlled   HTN (hypertension) 06/15/2015   Pneumonia    Past Surgical History:  Procedure Laterality Date   ANKLE SURGERY     BREAST LUMPECTOMY     5 times   CARPAL TUNNEL RELEASE     10/2015   CATARACT EXTRACTION     CHOLECYSTECTOMY     METATARSAL OSTEOTOMY Left 12/09/2020   Procedure: Left second metatarsal phalangeal joint debridement, 2-3 weil  osteotomies;  Surgeon: Toni Arthurs, MD;  Location: [facility];  Service: Orthopedics;  Laterality: Left;  60 mins [facility] regional vs General   NASAL SINUS SURGERY     right knee arthroscopic     ROTATOR CUFF REPAIR     TOTAL KNEE ARTHROPLASTY Right 03/28/2017   Procedure: RIGHT TOTAL KNEE ARTHROPLASTY;  Surgeon: Ranee Gosselin, MD;  Location: [facility];  Service: Orthopedics;  Laterality: Right;   TUBAL LIGATION     Family History  Problem Relation Age of Onset   Liver disease Father    Cirrhosis Father    Bone cancer Mother    Breast cancer Other        aunts   Colon cancer Neg Hx    Social History   Socioeconomic History   Marital status: Married    Spouse name: Not on file   Number of children: Not on file   Years of education: Not on file   Highest education level: 12th grade  Occupational History   Occupation: Self Employed  Tobacco Use   Smoking status: Never   Smokeless tobacco: Never  Vaping Use   Vaping status: Never Used  Substance and Sexual Activity   Alcohol use: No   Drug use: No   Sexual activity: Yes  Other Topics Concern   Not on file  Social History Narrative   Not on file   Social Determinants of Health   Financial Resource Strain: Low Risk  (12/14/2022)   Overall Financial Resource Strain (CARDIA)    Difficulty of Paying Living Expenses: Not hard at all  Food Insecurity: No Food Insecurity (12/14/2022)   Hunger Vital Sign    Worried About Running Out of Food in the Last Year: Never true    Ran Out of Food in the Last Year: Never true  Transportation Needs: No Transportation Needs (12/14/2022)   PRAPARE - Administrator, Civil Service (Medical): No    Lack of Transportation (Non-Medical): No  Physical Activity: Insufficiently Active (12/14/2022)   Exercise Vital Sign    Days of Exercise per Week: 2 days    Minutes of Exercise per Session: 50 min  Stress: No Stress Concern Present (12/14/2022)   [employer] of Occupational  Health - Occupational Stress Questionnaire    Feeling of Stress : Not at all  Social Connections: Socially Integrated (07/11/2023)   Social Connection and Isolation Panel [NHANES]    Frequency of Communication with Friends and Family: More than three times a week    Frequency of Social Gatherings with Friends and Family: Twice a week    Attends Religious Services: More than 4 times per year    Active Member of [employer] or Organizations: No    Attends Engineer, structural: More than 4 times per year    Marital Status: Married    Tobacco Counseling Counseling  given: Not Answered   Clinical Intake:     Pain : 0-10 Pain Score: 8  Pain Type: Chronic pain Pain Location: Back Pain Orientation: Mid, Lower Pain Descriptors / Indicators: Constant, Aching, Sharp Pain Onset: More than a month ago Pain Frequency: Constant     BMI - recorded: 26.89 Nutritional Status: BMI 25 -29 Overweight Nutritional Risks: None Diabetes: No CBG done?: No Did pt. bring in CBG monitor from home?: No  How often do you need to have someone help you when you read instructions, pamphlets, or other written materials from your doctor or pharmacy?: 1 - Never What is the last grade level you completed in school?: Some college         Activities of Daily Living    07/11/2023    4:51 PM  In your present state of health, do you have any difficulty performing the following activities:  Hearing? 0  Vision? 0  Difficulty concentrating or making decisions? 1  Comment remembering  Walking or climbing stairs? 0  Dressing or bathing? 0  Doing errands, shopping? 0  Preparing Food and eating ? N  Using the Toilet? N  In the past six months, have you accidently leaked urine? N  Do you have problems with loss of bowel control? N  Managing your Medications? N  Managing your Finances? N  Housekeeping or managing your Housekeeping? N    Patient Care Team: CoxFritzi Mandes, MD as PCP - General (Family  Medicine) Roxanne Gates, OD (Optometry)  Indicate any recent Medical Services you may have received from other than [facility] providers in the past year (date may be approximate).     Assessment:   This is a routine wellness examination for Meisha.  Hearing/Vision screen No results found.   Goals Addressed   None   Depression Screen    07/11/2023    4:47 PM 04/05/2023    8:26 AM 12/18/2022    8:20 AM 06/13/2022    9:10 AM 12/09/2021    8:48 AM 12/06/2020    8:21 AM 08/17/2020    9:34 AM  PHQ 2/9 Scores  PHQ - 2 Score 0 0 0 0 0 0 0  PHQ- 9 Score  1 1        Fall Risk    07/11/2023    4:47 PM 04/05/2023    8:26 AM 12/18/2022    8:20 AM 06/13/2022    9:14 AM 12/09/2021    8:48 AM  Fall Risk   Falls in the past year? 1 0 0 0 0  Number falls in past yr: 0 0 0 0 0  Injury with Fall? 0 0 0 0 0  Risk for fall due to : No Fall Risks No Fall Risks No Fall Risks No Fall Risks   Follow up Falls evaluation completed Falls evaluation completed;Falls prevention discussed  Falls evaluation completed Falls evaluation completed    MEDICARE RISK AT HOME: Medicare Risk at Home Any stairs in or around the home?: No If so, are there any without handrails?: No Home free of loose throw rugs in walkways, pet beds, electrical cords, etc?: Yes Adequate lighting in your home to reduce risk of falls?: Yes Life alert?: No Use of a cane, walker or w/c?: No Grab bars in the bathroom?: No Shower chair or bench in shower?: Yes Elevated toilet seat or a handicapped toilet?: Yes  TIMED UP AND GO:  Was the test performed?  Yes  Length of time to ambulate 10 feet:  15 sec Gait steady and fast without use of assistive device    Cognitive Function:        07/11/2023    4:55 PM  6CIT Screen  What Year? 0 points  What month? 0 points  What time? 0 points  Count back from 20 0 points  Months in reverse 0 points  Repeat phrase 2 points  Total Score 2 points    Immunizations Immunization History   Administered Date(s) Administered   DTaP 10/07/2019   Fluad Quad(high Dose 65+) 04/15/2020, 05/06/2021, 04/17/2022   Fluad Trivalent(High Dose 65+) 04/24/2023   Influenza,inj,Quad PF,6+ Mos 04/08/2015   Influenza-Unspecified 05/16/2014   Moderna Covid-19 Vaccine Bivalent Booster 69yrs & up 06/14/2021   Moderna SARS-COV2 Booster Vaccination 12/06/2020   Moderna Sars-Covid-2 Vaccination 09/11/2019, 10/07/2019, 05/25/2020   Pfizer(Comirnaty)Fall Seasonal Vaccine 12 years and older 06/13/2022, 05/08/2023   Pneumococcal Conjugate-13 03/16/2015   Pneumococcal Polysaccharide-23 11/28/2016   Tdap 10/02/2012   Zoster Recombinant(Shingrix) 03/12/2017    TDAP status: Up to date  Flu Vaccine status: Up to date  Pneumococcal vaccine status: Up to date  Covid-19 vaccine status: Completed vaccines  Qualifies for Shingles Vaccine? Yes   Zostavax completed No   Shingrix Completed?: Yes  Screening Tests Health Maintenance  Topic Date Due   Zoster Vaccines- Shingrix (2 of 2) 05/07/2017   OPHTHALMOLOGY EXAM  04/29/2023   Diabetic kidney evaluation - Urine ACR  12/18/2023   HEMOGLOBIN A1C  01/09/2024   FOOT EXAM  04/04/2024   Diabetic kidney evaluation - eGFR measurement  07/10/2024   Medicare Annual Wellness (AWV)  07/13/2024   DTaP/Tdap/Td (3 - Td or Tdap) 10/06/2029   Pneumonia Vaccine 57+ Years old  Completed   INFLUENZA VACCINE  Completed   DEXA SCAN  Completed   COVID-19 Vaccine  Completed   Hepatitis C Screening  Completed   HPV VACCINES  Aged Out   Colonoscopy  Discontinued    Health Maintenance  Health Maintenance Due  Topic Date Due   Zoster Vaccines- Shingrix (2 of 2) 05/07/2017   OPHTHALMOLOGY EXAM  04/29/2023    Colorectal cancer screening: No longer required.   Mammogram status: Completed 05/08/2023. Repeat every year  Bone Density status: Completed 09/27/2022. Results reflect: Bone density results: OSTEOPENIA. Repeat every 2 years.  Lung Cancer Screening:  (Low Dose CT Chest recommended if Age 2-80 years, 20 pack-year currently smoking OR have quit w/in 15years.) does not qualify.    Additional Screening:  Hepatitis C Screening: does qualify; Completed 06/13/2022  Vision Screening: Recommended annual ophthalmology exams for early detection of glaucoma and other disorders of the eye. Is the patient up to date with their annual eye exam?  Yes  Who is the provider or what is the name of the office in which the patient attends annual eye exams? Roxanne Gates If pt is not established with a provider, would they like to be referred to a provider to establish care? No .   Dental Screening: Recommended annual dental exams for proper oral hygiene  Diabetic Foot Exam: Diabetic Foot Exam: Completed 04/05/2023  Community Resource Referral / Chronic Care Management: CRR required this visit?  No   CCM required this visit?  No     Plan:    Encounter for Medicare annual wellness exam Assessment & Plan: Things to do to keep yourself healthy  - Exercise at least 30-45 minutes a day, 3-4 days a week.  - Eat a low-fat diet with lots of fruits and vegetables, up  to 7-9 servings per day.  - Seatbelts can save your life. Wear them always.  - Smoke detectors on every level of your home, check batteries every year.  - Eye Doctor - have an eye exam every 1-2 years  - Safe sex - if you may be exposed to STDs, use a condom.  - Alcohol -  If you drink, do it moderately, less than 2 drinks per day.  - Health Care Power of Attorney. Choose someone to speak for you if you are not able.  - Depression is common in our stressful world.If you're feeling down or losing interest in things you normally enjoy, please come in for a visit.  - Violence - If anyone is threatening or hurting you, please call immediately.    Hypertension associated with diabetes (HCC) Assessment & Plan: Diabetes and hypertension well controlled.  No changes to medicines. Telmisartan 80 mg  daily, amlodipine 5 mg daily. No medications for diabetes  Continue to work on eating a healthy diet and exercise.  Labs drawn today.    Orders: -     CBC with Differential/Platelet -     Comprehensive metabolic panel -     Hemoglobin A1c  Mixed hyperlipidemia Assessment & Plan: Well controlled.  No changes to medicines. Fenofibrate 160 mg daily, rosuvastatin 5 mg daily,  Continue to work on eating a healthy diet and exercise.  Labs drawn today.    Orders: -     Lipid panel  Essential hypertension, benign Assessment & Plan: Well controlled.  No changes to medicines. Telmisartan 80 mg daily, amlodipine 5 mg daily. Continue to work on eating a healthy diet and exercise.  Labs drawn today.    Lower abdominal pain Assessment & Plan: Constant stomach pain and frequent gas. No relief with Gas-X. No clear food triggers identified. Possible link to back pain or constipation medication. -Start dicyclomine before meals and at bedtime as needed for pain and gas. -Continue current constipation and gas medications. -Consider discontinuing Gas-X if dicyclomine provides relief. Sent Bentyl 10 mg before meals and before bed prn  Orders: -     Dicyclomine HCl; Before meals and before bed as needed for gas/abdominal pain.  Dispense: 120 capsule; Refill: 0  Lumbar back pain Assessment & Plan: Persistent despite previous injections. Patient is under significant stress due to husband's health issues which may be contributing to pain. Surgery has been discussed but deferred due to current circumstances. -Continue current pain management strategy. -Consider physical therapy (patient to inquire with specialist). -Plan for follow-up with specialist when patient's circumstances allow.      I have personally reviewed and noted the following in the patient's chart:   Medical and social history Use of alcohol, tobacco or illicit drugs  Current medications and supplements including opioid  prescriptions. Patient is not currently taking opioid prescriptions. Functional ability and status Nutritional status Physical activity Advanced directives List of other physicians Hospitalizations, surgeries, and ER visits in previous 12 months Vitals Screenings to include cognitive, depression, and falls Referrals and appointments  In addition, I have reviewed and discussed with patient certain preventive protocols, quality metrics, and best practice recommendations. A written personalized care plan for preventive services as well as general preventive health recommendations were provided to patient.     Blane Ohara, MD   07/14/2023

## 2023-07-12 LAB — COMPREHENSIVE METABOLIC PANEL
ALT: 40 [IU]/L — ABNORMAL HIGH (ref 0–32)
AST: 42 [IU]/L — ABNORMAL HIGH (ref 0–40)
Albumin: 4.5 g/dL (ref 3.8–4.8)
Alkaline Phosphatase: 63 [IU]/L (ref 44–121)
BUN/Creatinine Ratio: 32 — ABNORMAL HIGH (ref 12–28)
BUN: 27 mg/dL (ref 8–27)
Bilirubin Total: 0.3 mg/dL (ref 0.0–1.2)
CO2: 23 mmol/L (ref 20–29)
Calcium: 9.6 mg/dL (ref 8.7–10.3)
Chloride: 105 mmol/L (ref 96–106)
Creatinine, Ser: 0.84 mg/dL (ref 0.57–1.00)
Globulin, Total: 2.1 g/dL (ref 1.5–4.5)
Glucose: 90 mg/dL (ref 70–99)
Potassium: 4.7 mmol/L (ref 3.5–5.2)
Sodium: 143 mmol/L (ref 134–144)
Total Protein: 6.6 g/dL (ref 6.0–8.5)
eGFR: 72 mL/min/{1.73_m2} (ref >59)

## 2023-07-12 LAB — LIPID PANEL
Chol/HDL Ratio: 2.6 {ratio} (ref 0.0–4.4)
Cholesterol, Total: 144 mg/dL (ref 100–199)
HDL: 55 mg/dL (ref >39)
LDL Chol Calc (NIH): 62 mg/dL (ref 0–99)
Triglycerides: 163 mg/dL — ABNORMAL HIGH (ref 0–149)
VLDL Cholesterol Cal: 27 mg/dL (ref 5–40)

## 2023-07-12 LAB — CBC WITH DIFFERENTIAL/PLATELET
Basophils Absolute: 0.1 10*3/uL (ref 0.0–0.2)
Basos: 1 %
EOS (ABSOLUTE): 0.3 10*3/uL (ref 0.0–0.4)
Eos: 4 %
Hematocrit: 36.1 % (ref 34.0–46.6)
Hemoglobin: 11.7 g/dL (ref 11.1–15.9)
Immature Grans (Abs): 0 10*3/uL (ref 0.0–0.1)
Immature Granulocytes: 0 %
Lymphocytes Absolute: 2.1 10*3/uL (ref 0.7–3.1)
Lymphs: 27 %
MCH: 29.9 pg (ref 26.6–33.0)
MCHC: 32.4 g/dL (ref 31.5–35.7)
MCV: 92 fL (ref 79–97)
Monocytes Absolute: 0.6 10*3/uL (ref 0.1–0.9)
Monocytes: 8 %
Neutrophils Absolute: 4.7 10*3/uL (ref 1.4–7.0)
Neutrophils: 60 %
Platelets: 319 10*3/uL (ref 150–450)
RBC: 3.91 x10E6/uL (ref 3.77–5.28)
RDW: 12 % (ref 11.7–15.4)
WBC: 7.8 10*3/uL (ref 3.4–10.8)

## 2023-07-12 LAB — HEMOGLOBIN A1C
Est. average glucose Bld gHb Est-mCnc: 131 mg/dL
Hgb A1c MFr Bld: 6.2 % — ABNORMAL HIGH (ref 4.8–5.6)

## 2023-07-14 ENCOUNTER — Encounter: Payer: Self-pay | Admitting: Family Medicine

## 2023-07-14 DIAGNOSIS — H3530 Unspecified macular degeneration: Secondary | ICD-10-CM | POA: Insufficient documentation

## 2023-07-14 DIAGNOSIS — I10 Essential (primary) hypertension: Secondary | ICD-10-CM | POA: Insufficient documentation

## 2023-07-14 DIAGNOSIS — Z0000 Encounter for general adult medical examination without abnormal findings: Secondary | ICD-10-CM | POA: Insufficient documentation

## 2023-07-14 NOTE — Assessment & Plan Note (Signed)
Persistent despite previous injections. Patient is under significant stress due to husband's health issues which may be contributing to pain. Surgery has been discussed but deferred due to current circumstances. -Continue current pain management strategy. -Consider physical therapy (patient to inquire with specialist). -Plan for follow-up with specialist when patient's circumstances allow.

## 2023-07-14 NOTE — Assessment & Plan Note (Signed)
Well controlled.  No changes to medicines. Telmisartan 80 mg daily, amlodipine 5 mg daily. Continue to work on eating a healthy diet and exercise.  Labs drawn today.

## 2023-07-14 NOTE — Assessment & Plan Note (Signed)
Well controlled.  No changes to medicines. Fenofibrate 160 mg daily, rosuvastatin 5 mg daily,  Continue to work on eating a healthy diet and exercise.  Labs drawn today.

## 2023-07-14 NOTE — Assessment & Plan Note (Addendum)
Constant stomach pain and frequent gas. No relief with Gas-X. No clear food triggers identified. Possible link to back pain or constipation medication. -Start dicyclomine before meals and at bedtime as needed for pain and gas. -Continue current constipation and gas medications. -Consider discontinuing Gas-X if dicyclomine provides relief. Sent Bentyl 10 mg before meals and before bed prn

## 2023-07-14 NOTE — Assessment & Plan Note (Addendum)
Diabetes and hypertension well controlled.  No changes to medicines. Telmisartan 80 mg daily, amlodipine 5 mg daily. No medications for diabetes  Continue to work on eating a healthy diet and exercise.  Labs drawn today.

## 2023-07-14 NOTE — Assessment & Plan Note (Signed)

## 2023-07-18 ENCOUNTER — Other Ambulatory Visit: Payer: Self-pay | Admitting: Family Medicine

## 2023-07-18 DIAGNOSIS — M19041 Primary osteoarthritis, right hand: Secondary | ICD-10-CM

## 2023-08-01 ENCOUNTER — Other Ambulatory Visit: Payer: Self-pay | Admitting: Family Medicine

## 2023-08-01 DIAGNOSIS — E782 Mixed hyperlipidemia: Secondary | ICD-10-CM

## 2023-08-03 ENCOUNTER — Encounter: Payer: Self-pay | Admitting: Physician Assistant

## 2023-08-03 ENCOUNTER — Ambulatory Visit (INDEPENDENT_AMBULATORY_CARE_PROVIDER_SITE_OTHER): Payer: Medicare Other | Admitting: Physician Assistant

## 2023-08-03 VITALS — BP 116/66 | HR 82 | Temp 97.9°F | Resp 18 | Ht 62.0 in | Wt 150.0 lb

## 2023-08-03 DIAGNOSIS — J101 Influenza due to other identified influenza virus with other respiratory manifestations: Secondary | ICD-10-CM | POA: Insufficient documentation

## 2023-08-03 DIAGNOSIS — J060 Acute laryngopharyngitis: Secondary | ICD-10-CM

## 2023-08-03 LAB — POCT INFLUENZA A/B
Influenza A, POC: POSITIVE — AB
Influenza B, POC: NEGATIVE

## 2023-08-03 LAB — POC COVID19 BINAXNOW: SARS Coronavirus 2 Ag: NEGATIVE

## 2023-08-03 MED ORDER — OSELTAMIVIR PHOSPHATE 75 MG PO CAPS: 75.0000 mg | ORAL_CAPSULE | Freq: Two times a day (BID) | ORAL | 0 refills | Status: DC

## 2023-08-03 MED ORDER — BENZONATATE 100 MG PO CAPS
100.0000 mg | ORAL_CAPSULE | Freq: Two times a day (BID) | ORAL | 0 refills | Status: DC | PRN
Start: 2023-08-03 — End: 2024-01-11

## 2023-08-03 NOTE — Progress Notes (Signed)
Acute Office Visit  Subjective:    Patient ID: Denise [name], female    DOB: Dec 06, 1945, 77 y.o.   MRN: [id number]  Chief Complaint  Patient presents with   Cough   Generalized Body Aches   Ear Pain    right   Nasal Congestion    HPI: Patient is in today for complaints of uri symptoms, body aches and dry cough for the past several days.  Has not had a fever but has had malaise.  Has had exposure to flu   Current Outpatient Medications:    amLODipine (NORVASC) 5 MG tablet, TAKE 1 TABLET (5 MG TOTAL) BY MOUTH DAILY., Disp: 90 tablet, Rfl: 1   benzonatate (TESSALON) 100 MG capsule, Take 1 capsule (100 mg total) by mouth 2 (two) times daily as needed for cough., Disp: 20 capsule, Rfl: 0   Calcium Carb-Cholecalciferol (CALCIUM 600 + D PO), Take 1 tablet by mouth daily., Disp: , Rfl:    celecoxib (CELEBREX) 100 MG capsule, TAKE 1 CAPSULE BY MOUTH EVERY DAY, Disp: 90 capsule, Rfl: 1   dicyclomine (BENTYL) 10 MG capsule, Before meals and before bed as needed for gas/abdominal pain., Disp: 120 capsule, Rfl: 0   fenofibrate 160 MG tablet, TAKE 1 TABLET BY MOUTH EVERY DAY, Disp: 90 tablet, Rfl: 0   mometasone (ELOCON) 0.1 % cream, mometasone 0.1 % topical cream  APPLY SPARINGLY TO AFFECTED AREA EVERY DAY, Disp: , Rfl:    Multiple Vitamin (MULTIVITAMIN) tablet, Take 1 tablet by mouth daily., Disp: , Rfl:    Multiple Vitamins-Minerals (PRESERVISION AREDS PO), Take by mouth., Disp: , Rfl:    oseltamivir (TAMIFLU) 75 MG capsule, Take 1 capsule (75 mg total) by mouth 2 (two) times daily., Disp: 10 capsule, Rfl: 0   rosuvastatin (CRESTOR) 5 MG tablet, TAKE 1 TABLET (5 MG TOTAL) BY MOUTH DAILY., Disp: 90 tablet, Rfl: 1   telmisartan (MICARDIS) 80 MG tablet, TAKE 1 TABLET BY MOUTH EVERY DAY, Disp: 90 tablet, Rfl: 0   vitamin C (ASCORBIC ACID) 500 MG tablet, Take 500 mg by mouth daily., Disp: , Rfl:   Allergies  Allergen Reactions   Lisinopril Other (See Comments)    cough     ROS CONSTITUTIONAL: see HPI E/N/T: see HPI CARDIOVASCULAR: Negative for chest pain, dizziness, palpitations and pedal edema.  RESPIRATORY: Negative for recent cough and dyspnea.  GASTROINTESTINAL: Negative for abdominal pain, acid reflux symptoms, constipation, diarrhea, nausea and vomiting.      Objective:    PHYSICAL EXAM:   BP 116/66 (BP Location: Left Arm, Patient Position: Sitting, Cuff Size: Normal)   Pulse 82   Temp 97.9 F (36.6 C) (Temporal)   Resp 18   Ht 5\' 2"  (1.575 m)   Wt 150 lb (68 kg)   SpO2 97%   BMI 27.44 kg/m    GEN: Well nourished, well developed, in no acute distress  HEENT: normal external ears and nose - normal external auditory canals and TMS - - Lips, Teeth and Gums - normal  Oropharynx - normal mucosa, palate, and posterior pharynx Cardiac: RRR; no murmurs,  Respiratory:  normal respiratory rate and pattern with no distress - normal breath sounds with no rales, rhonchi, wheezes or rubs  Office Visit on 08/03/2023  Component Date Value Ref Range Status   Influenza A, POC 08/03/2023 Positive (A)  Negative Final   Influenza B, POC 08/03/2023 Negative  Negative Final   SARS Coronavirus 2 Ag 08/03/2023 Negative  Negative Final  Assessment & Plan:    Acute laryngopharyngitis -     POCT Influenza A/B -     POC COVID-19 BinaxNow Continue otc decongestants Influenza A -     Benzonatate; Take 1 capsule (100 mg total) by mouth 2 (two) times daily as needed for cough.  Dispense: 20 capsule; Refill: 0 -     Oseltamivir Phosphate; Take 1 capsule (75 mg total) by mouth 2 (two) times daily.  Dispense: 10 capsule; Refill: 0     Follow-up: Return if symptoms worsen or fail to improve.  An After Visit Summary was printed and given to the patient.  Jettie Pagan [facility] [phone]

## 2023-08-04 ENCOUNTER — Other Ambulatory Visit: Payer: Self-pay | Admitting: Family Medicine

## 2023-08-04 DIAGNOSIS — R1030 Lower abdominal pain, unspecified: Secondary | ICD-10-CM

## 2023-08-14 DIAGNOSIS — M545 Low back pain, unspecified: Secondary | ICD-10-CM | POA: Diagnosis not present

## 2023-08-30 DIAGNOSIS — M545 Low back pain, unspecified: Secondary | ICD-10-CM | POA: Diagnosis not present

## 2023-09-07 DIAGNOSIS — M545 Low back pain, unspecified: Secondary | ICD-10-CM | POA: Diagnosis not present

## 2023-09-07 DIAGNOSIS — M791 Myalgia, unspecified site: Secondary | ICD-10-CM | POA: Diagnosis not present

## 2023-09-13 IMAGING — US US BREAST*L* LIMITED INC AXILLA
1 series · 5 of 5 positions shown · non-contrast
Comparison: Previous exam(s).

CLINICAL DATA: Short-term follow-up for a likely benign left breast
mass.

EXAM:
DIGITAL DIAGNOSTIC BILATERAL MAMMOGRAM WITH TOMOSYNTHESIS AND CAD;
ULTRASOUND LEFT BREAST LIMITED
TECHNIQUE: Bilateral digital diagnostic mammography and breast tomosynthesis
was performed. The images were evaluated with computer-aided
detection.; Targeted ultrasound examination of the left breast was
performed.

[Series 1: us breast*left* limited inc axilla · 0.07mm/px · 5 of 5 slices shown]
[im 1/5]
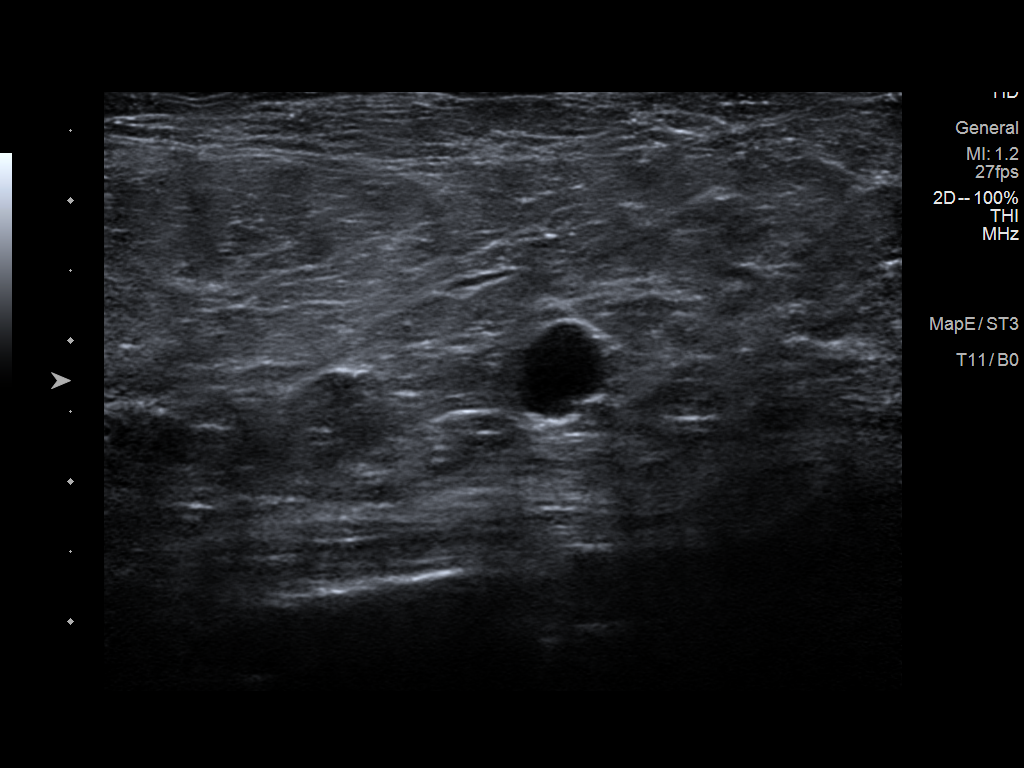
[im 2/5]
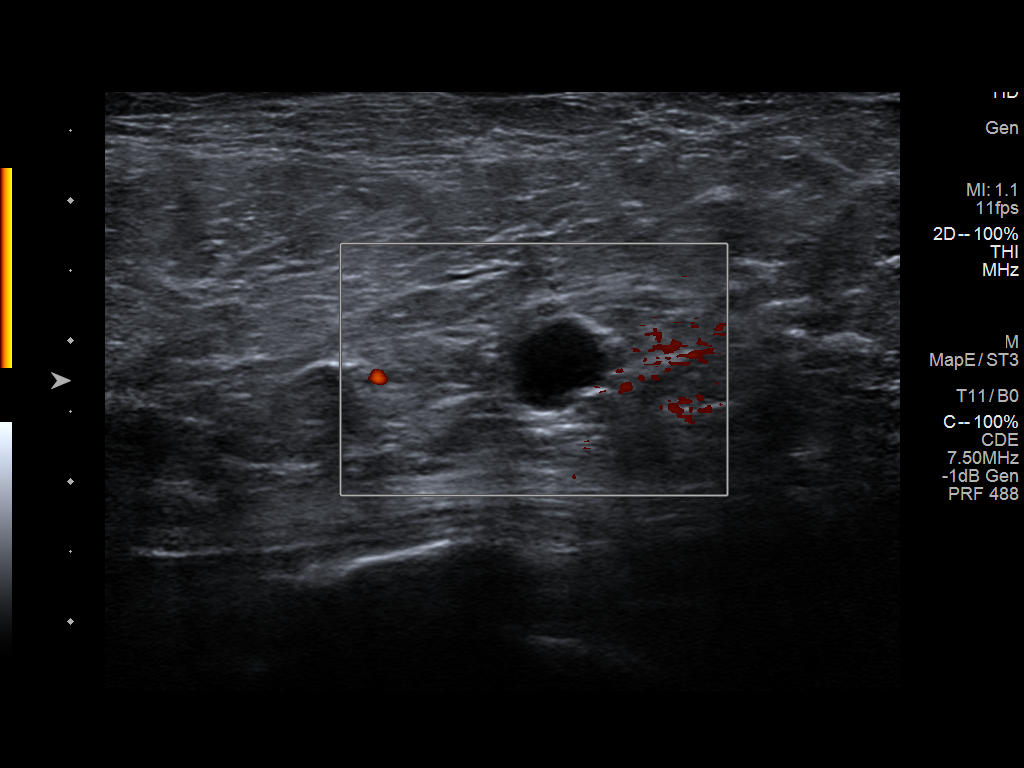
[im 3/5]
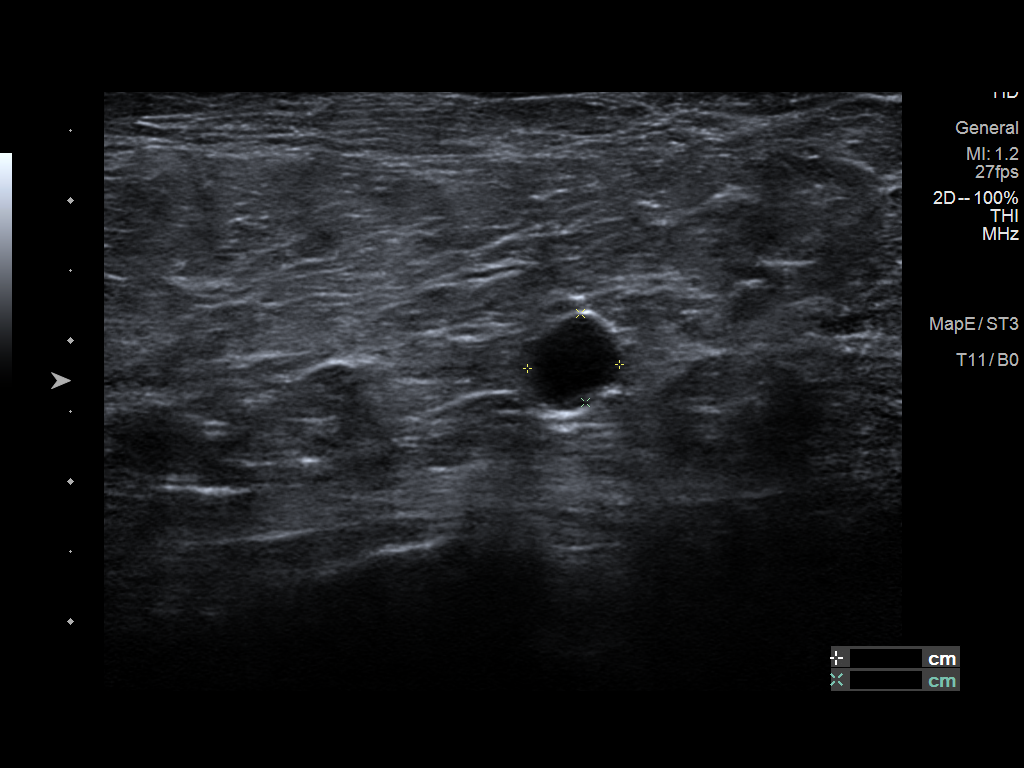
[im 4/5]
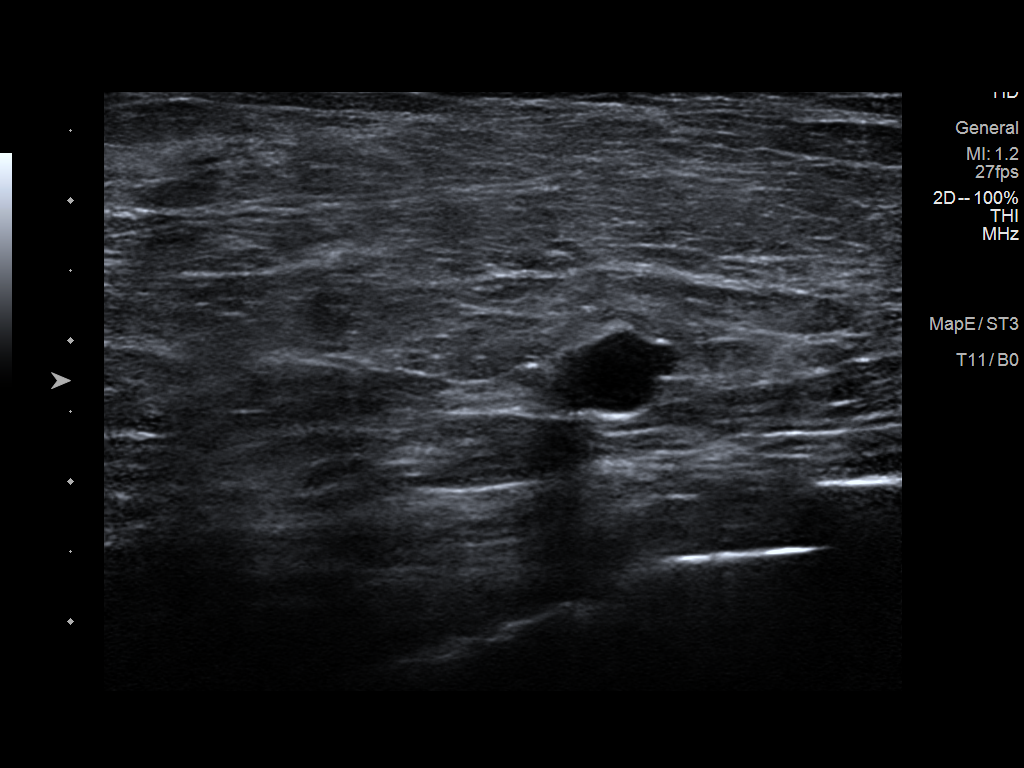
[im 5/5]
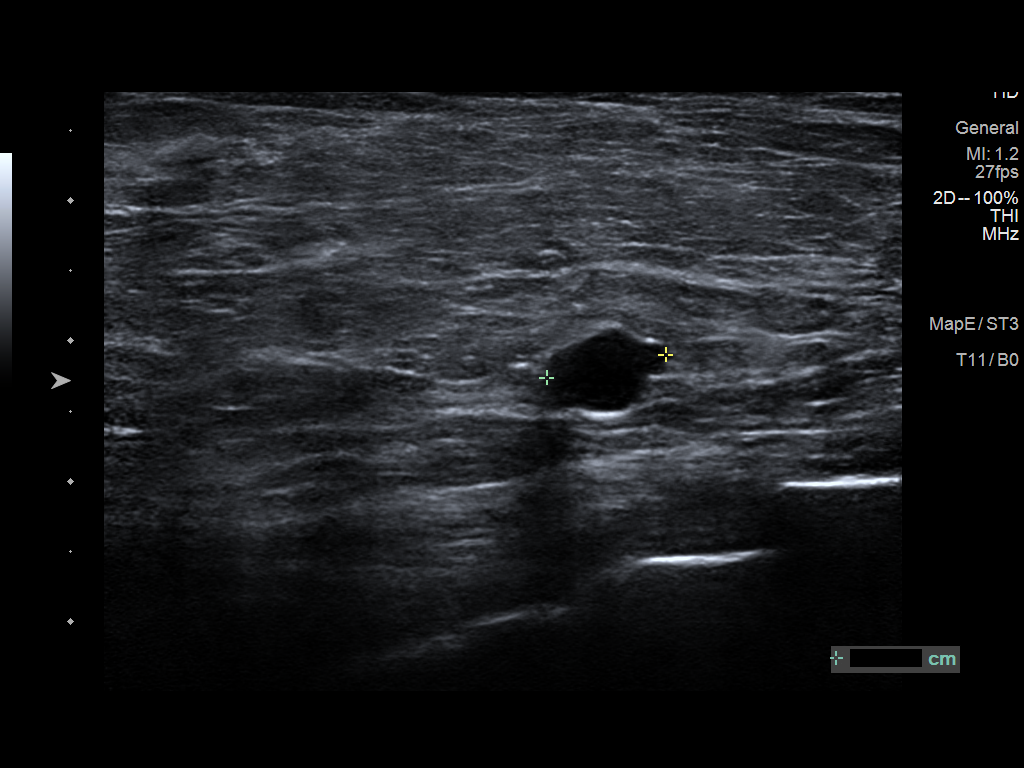

[5 of 5 positions shown; findings below may reference images not displayed]

ACR Breast Density Category c: The breast tissue is heterogeneously
dense, which may obscure small masses.
FINDINGS: The mass in the lateral aspect of the left breast appears
mammographically stable. No suspicious calcifications, masses or
areas of distortion are seen in the bilateral breasts.

Ultrasound targeted to the left breast at [DATE], 6 cm from the nipple
demonstrates a stable near anechoic oval circumscribed mass
measuring 0.9 x 0.6 x 0.7 cm, previously 0.7 x 0.6 x 0.7 cm in
Saturday March, 2020.
IMPRESSION: 1.  The likely benign left breast mass at [DATE] is stable.

2.  No mammographic evidence of malignancy in the bilateral breasts.

RECOMMENDATION:
Bilateral diagnostic mammogram and left breast ultrasound in 1 year.

I have discussed the findings and recommendations with the patient.
If applicable, a reminder letter will be sent to the patient
regarding the next appointment.

BI-RADS CATEGORY  3: Probably benign.

## 2023-09-13 IMAGING — MG DIGITAL DIAGNOSTIC BILAT W/ TOMO W/ CAD
8 series · 8 of 24 positions shown · non-contrast
Comparison: Previous exam(s).

CLINICAL DATA: Short-term follow-up for a likely benign left breast
mass.

EXAM:
DIGITAL DIAGNOSTIC BILATERAL MAMMOGRAM WITH TOMOSYNTHESIS AND CAD;
ULTRASOUND LEFT BREAST LIMITED
TECHNIQUE: Bilateral digital diagnostic mammography and breast tomosynthesis
was performed. The images were evaluated with computer-aided
detection.; Targeted ultrasound examination of the left breast was
performed.

[R MLO synth-2D]
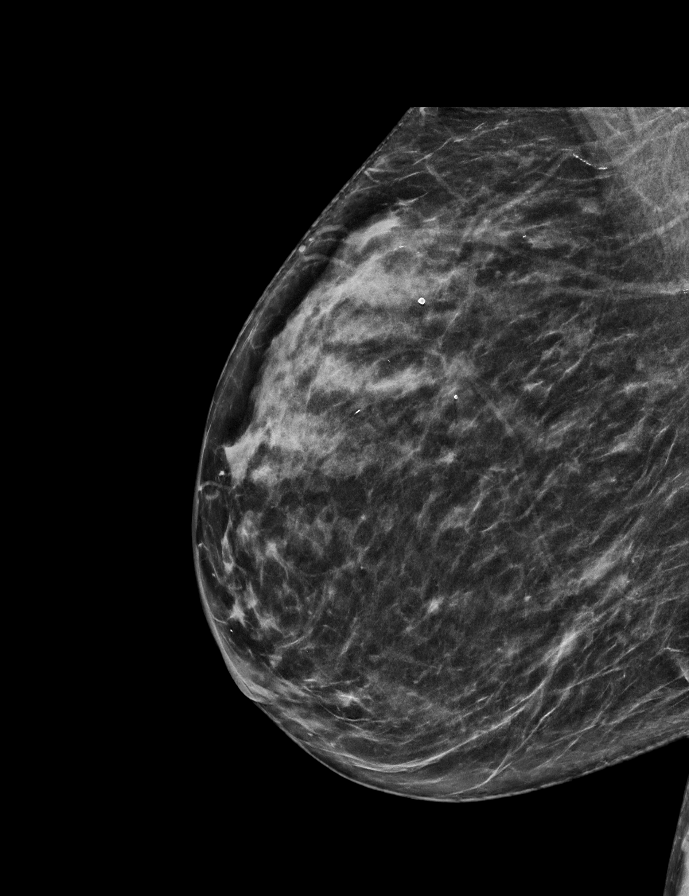

[R CC synth-2D]
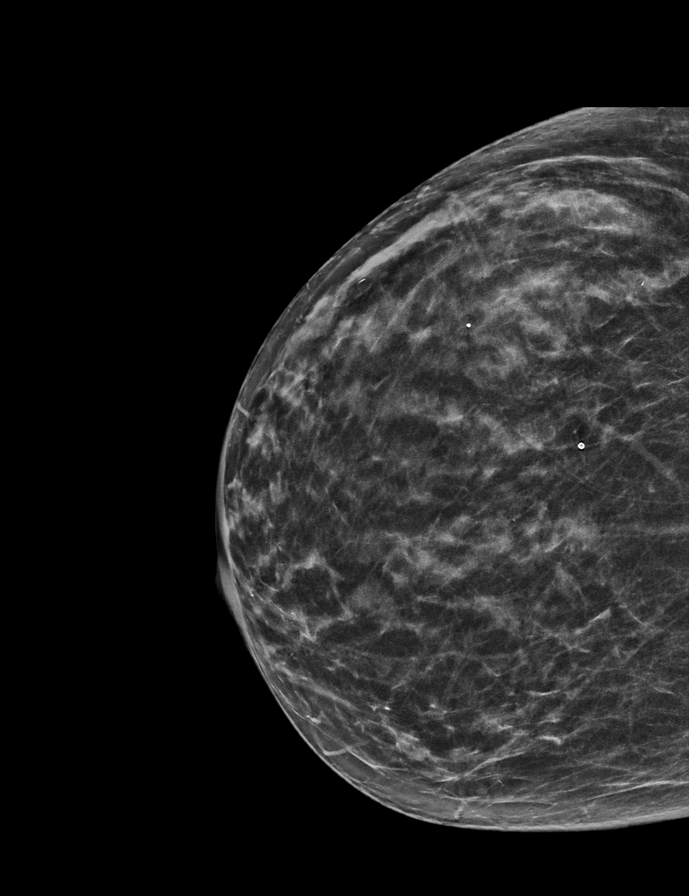

[L CC synth-2D]
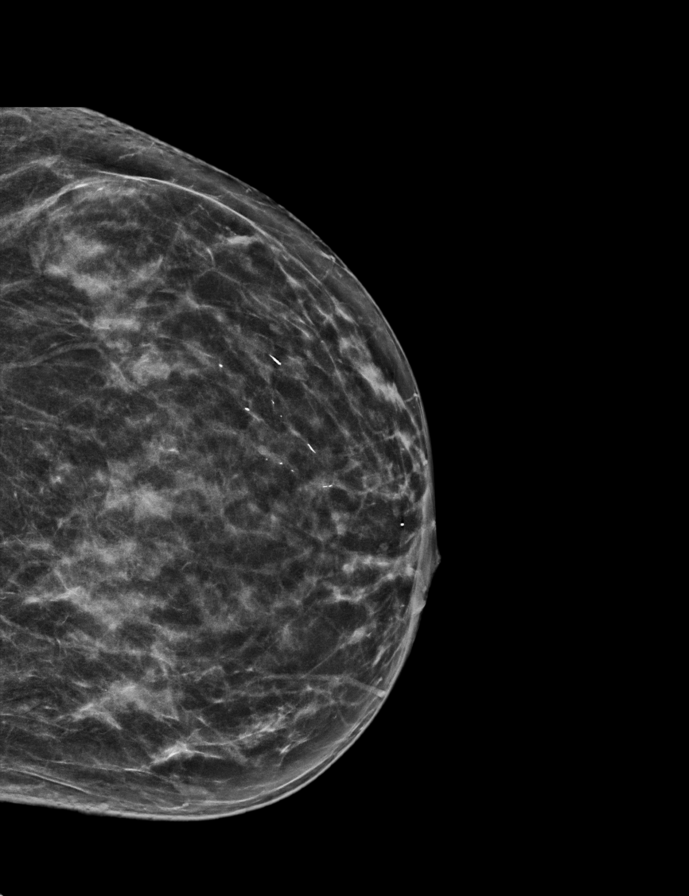

[L MLO synth-2D]
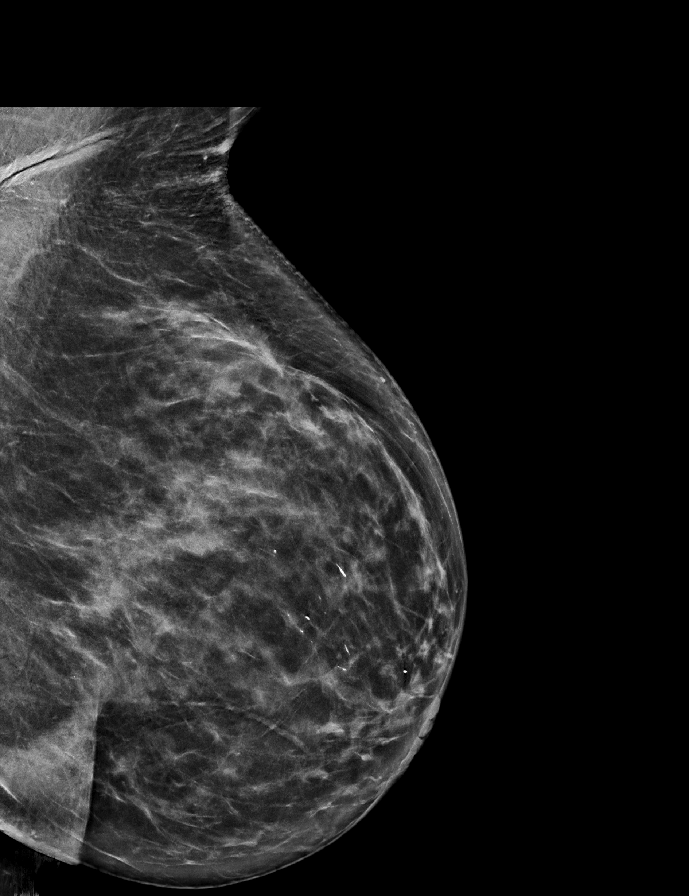

[R MLO tomo · tomo slice 31/62.0]
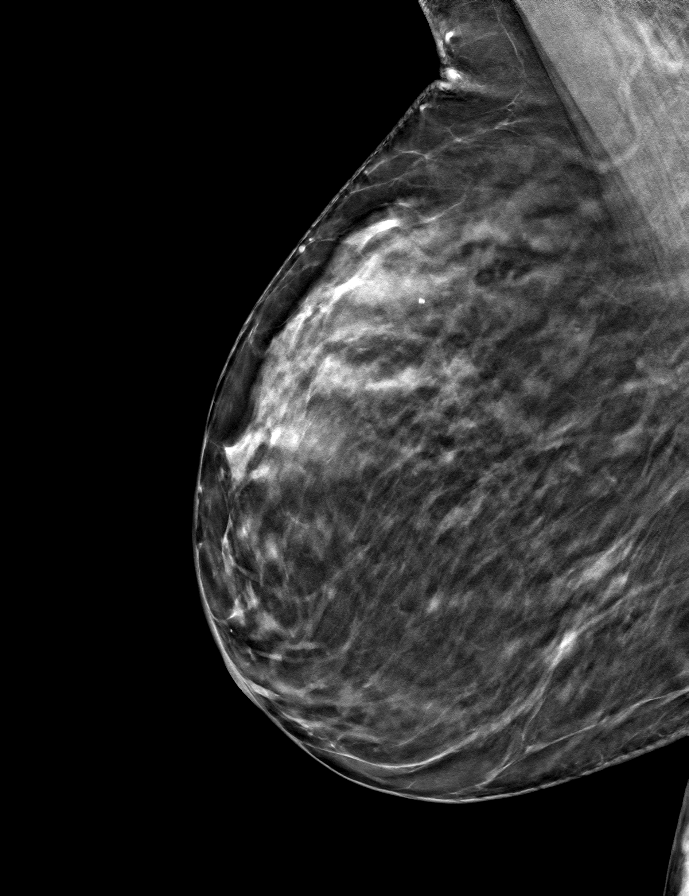

[L CC tomo · tomo slice 28/55.0]
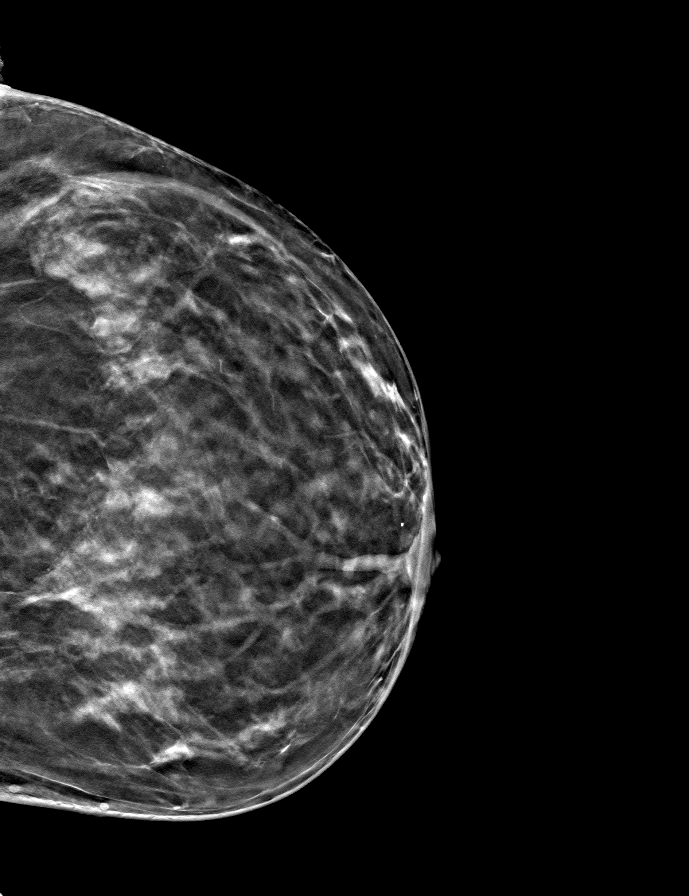

[L MLO tomo · tomo slice 34/67.0]
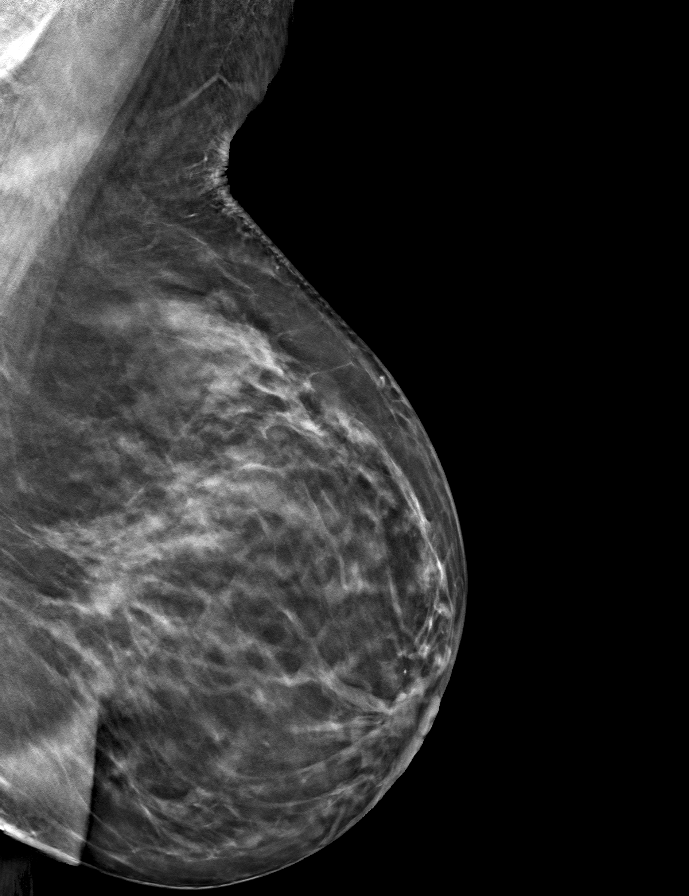

[R CC tomo · tomo slice 29/56.0]
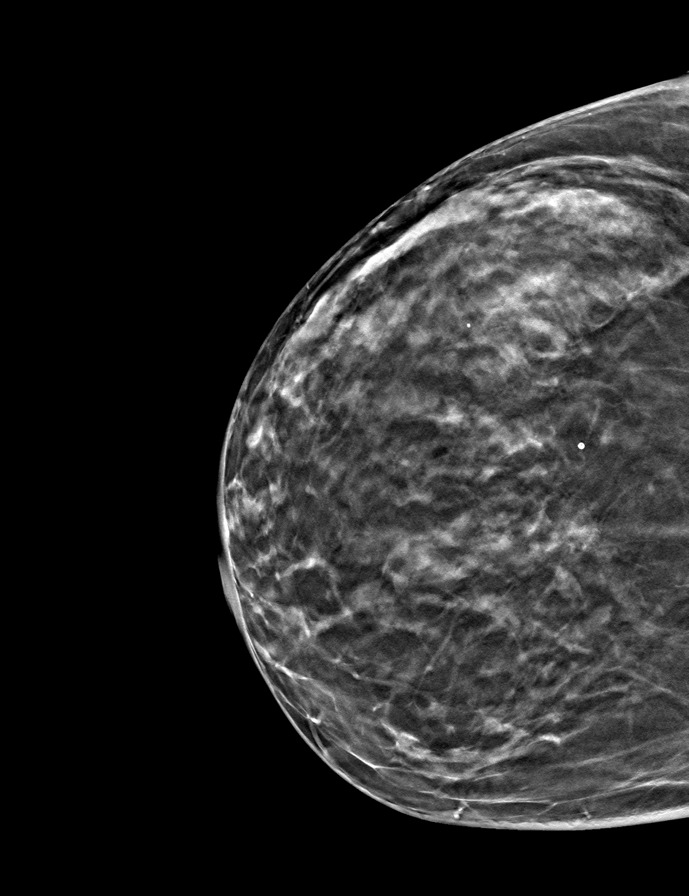

[8 of 24 positions shown; findings below may reference images not displayed]

ACR Breast Density Category c: The breast tissue is heterogeneously
dense, which may obscure small masses.
FINDINGS: The mass in the lateral aspect of the left breast appears
mammographically stable. No suspicious calcifications, masses or
areas of distortion are seen in the bilateral breasts.

Ultrasound targeted to the left breast at [DATE], 6 cm from the nipple
demonstrates a stable near anechoic oval circumscribed mass
measuring 0.9 x 0.6 x 0.7 cm, previously 0.7 x 0.6 x 0.7 cm in
Saturday March, 2020.
IMPRESSION: 1.  The likely benign left breast mass at [DATE] is stable.

2.  No mammographic evidence of malignancy in the bilateral breasts.

RECOMMENDATION:
Bilateral diagnostic mammogram and left breast ultrasound in 1 year.

I have discussed the findings and recommendations with the patient.
If applicable, a reminder letter will be sent to the patient
regarding the next appointment.

BI-RADS CATEGORY  3: Probably benign.

## 2023-09-16 ENCOUNTER — Other Ambulatory Visit: Payer: Self-pay | Admitting: Family Medicine

## 2023-09-16 DIAGNOSIS — I10 Essential (primary) hypertension: Secondary | ICD-10-CM

## 2023-10-02 ENCOUNTER — Other Ambulatory Visit: Payer: Self-pay | Admitting: Family Medicine

## 2023-10-02 DIAGNOSIS — I10 Essential (primary) hypertension: Secondary | ICD-10-CM

## 2023-10-05 DIAGNOSIS — M545 Low back pain, unspecified: Secondary | ICD-10-CM | POA: Diagnosis not present

## 2023-10-28 ENCOUNTER — Other Ambulatory Visit: Payer: Self-pay | Admitting: Family Medicine

## 2023-10-28 DIAGNOSIS — E782 Mixed hyperlipidemia: Secondary | ICD-10-CM

## 2023-12-12 ENCOUNTER — Telehealth: Payer: Self-pay

## 2023-12-12 NOTE — Telephone Encounter (Signed)
Contacted Denise [name] to schedule their annual wellness visit. Patient declined to schedule AWV at this time.

## 2023-12-12 NOTE — Telephone Encounter (Signed)
 Please call this patient to schedule the next AWV appointment with Delford Felling, FNP Last date of AWV: 07/11/2023

## 2024-01-10 ENCOUNTER — Other Ambulatory Visit: Payer: Self-pay | Admitting: Family Medicine

## 2024-01-10 DIAGNOSIS — M19041 Primary osteoarthritis, right hand: Secondary | ICD-10-CM

## 2024-01-10 NOTE — Progress Notes (Signed)
 Subjective:  Patient ID: Denise [name], female    DOB: [birth year]  Age: 78 y.o. MRN: [id number]  Chief Complaint  Patient presents with   Medical Management of Chronic Issues    HPI: Diabetes:  Complications: hyperlipidemia, hypertension Glucose checking: not checking Glucose logs: No Hypoglycemia:No Most recent A1C: 6.2 Current medications: None- Patient is diet controlled. Checking feet daily.   Hyperlipidemia: Current medications: Fenofibrate  160mg  1 tablet once daily, rosuvastatin  5 mg once daily.  NO side effects.   Hypertension: Current medications: Amlodipine  5mg  1 tablet daily, Telmisartan  80mg  1 tablet daily.   Back pain:  evaluated by Dr. Assunta Lax at [facility] and given back injection. Patient has knee pain. Has an appointment with Dr, Assunta Lax on Monday.   Diet:fairly healthy Exercise:  Walking limited due to knee pain.     07/11/2023    4:47 PM 04/05/2023    8:26 AM 12/18/2022    8:20 AM 06/13/2022    9:10 AM 12/09/2021    8:48 AM  Depression screen PHQ 2/9  Decreased Interest 0 0 0 0 0  Down, Depressed, Hopeless 0 0 0 0 0  PHQ - 2 Score 0 0 0 0 0  Altered sleeping  0 0    Tired, decreased energy  1 1    Change in appetite  0 0    Feeling bad or failure about yourself   0 0    Trouble concentrating  0 0    Moving slowly or fidgety/restless  0 0    Suicidal thoughts  0 0    PHQ-9 Score  1 1    Difficult doing work/chores  Not difficult at all Not difficult at all          07/11/2023    4:47 PM  Fall Risk   Falls in the past year? 1  Number falls in past yr: 0  Injury with Fall? 0  Risk for fall due to : No Fall Risks  Follow up Falls evaluation completed    Patient Care Team: Mercy Stall, MD as PCP - General (Family Medicine) Rockie Churchman, OD (Optometry)   Review of Systems  Constitutional:  Negative for appetite change, fatigue and fever.  HENT:  Negative for congestion, ear pain, sinus pressure and sore throat.   Respiratory:   Positive for shortness of breath. Negative for cough, chest tightness and wheezing.   Cardiovascular:  Negative for chest pain and palpitations.  Gastrointestinal:  Positive for constipation. Negative for abdominal pain, diarrhea, nausea and vomiting.  Genitourinary:  Negative for dysuria and hematuria.  Musculoskeletal:  Positive for myalgias (Left knee pain- patient is seeing ortho on monday). Negative for arthralgias, back pain and joint swelling.  Skin:  Negative for rash.  Neurological:  Negative for dizziness, weakness and headaches.  Psychiatric/Behavioral:  Negative for dysphoric mood. The patient is not nervous/anxious.     Current Outpatient Medications on File Prior to Visit  Medication Sig Dispense Refill   amLODipine  (NORVASC ) 5 MG tablet TAKE 1 TABLET (5 MG TOTAL) BY MOUTH DAILY. 90 tablet 1   Calcium  Carb-Cholecalciferol (CALCIUM  600 + D PO) Take 1 tablet by mouth daily.     celecoxib  (CELEBREX ) 100 MG capsule TAKE 1 CAPSULE BY MOUTH EVERY DAY 90 capsule 1   dicyclomine  (BENTYL ) 10 MG capsule BEFORE MEALS AND BEFORE BED AS NEEDED FOR GAS/ABDOMINAL PAIN. 360 capsule 1   fenofibrate  160 MG tablet TAKE 1 TABLET BY MOUTH EVERY DAY 90 tablet 1  mometasone (ELOCON) 0.1 % cream mometasone 0.1 % topical cream  APPLY SPARINGLY TO AFFECTED AREA EVERY DAY     Multiple Vitamin (MULTIVITAMIN) tablet Take 1 tablet by mouth daily.     Multiple Vitamins-Minerals (PRESERVISION AREDS PO) Take by mouth.     rosuvastatin  (CRESTOR ) 5 MG tablet TAKE 1 TABLET (5 MG TOTAL) BY MOUTH DAILY. 90 tablet 1   telmisartan  (MICARDIS ) 80 MG tablet TAKE 1 TABLET BY MOUTH EVERY DAY 90 tablet 3   vitamin C (ASCORBIC ACID) 500 MG tablet Take 500 mg by mouth daily.     No current facility-administered medications on file prior to visit.   Past Medical History:  Diagnosis Date   Aortic atherosclerosis (HCC) 2020   Arthritis    Constipation    Diabetes mellitus without complication (HCC)    diet controlled    HTN (hypertension) 06/15/2015   Pneumonia    Past Surgical History:  Procedure Laterality Date   ANKLE SURGERY     BREAST LUMPECTOMY     5 times   CARPAL TUNNEL RELEASE     10/2015   CATARACT EXTRACTION     CHOLECYSTECTOMY     METATARSAL OSTEOTOMY Left 12/09/2020   Procedure: Left second metatarsal phalangeal joint debridement, 2-3 weil osteotomies;  Surgeon: Amada Backer, MD;  Location:  [facility];  Service: Orthopedics;  Laterality: Left;  60 mins [facility] regional vs General   NASAL SINUS SURGERY     right knee arthroscopic     ROTATOR CUFF REPAIR     TOTAL KNEE ARTHROPLASTY Right 03/28/2017   Procedure: RIGHT TOTAL KNEE ARTHROPLASTY;  Surgeon: Hazle Lites, MD;  Location: [facility];  Service: Orthopedics;  Laterality: Right;   TUBAL LIGATION      Family History  Problem Relation Age of Onset   Liver disease Father    Cirrhosis Father    Bone cancer Mother    Breast cancer Other        aunts   Colon cancer Neg Hx    Social History   Socioeconomic History   Marital status: Married    Spouse name: Not on file   Number of children: Not on file   Years of education: Not on file   Highest education level: 12th grade  Occupational History   Occupation: Self Employed  Tobacco Use   Smoking status: Never   Smokeless tobacco: Never  Vaping Use   Vaping status: Never Used  Substance and Sexual Activity   Alcohol use: No   Drug use: No   Sexual activity: Yes  Other Topics Concern   Not on file  Social History Narrative   Not on file   Social Drivers of Health   Financial Resource Strain: Low Risk  (12/14/2022)   Overall Financial Resource Strain (CARDIA)    Difficulty of Paying Living Expenses: Not hard at all  Food Insecurity: No Food Insecurity (12/14/2022)   Hunger Vital Sign    Worried About Running Out of Food in the Last Year: Never true    Ran Out of Food in the Last Year: Never true  Transportation Needs: No Transportation Needs (12/14/2022)    PRAPARE - Administrator, Civil Service (Medical): No    Lack of Transportation (Non-Medical): No  Physical Activity: Insufficiently Active (12/14/2022)   Exercise Vital Sign    Days of Exercise per Week: 2 days    Minutes of Exercise per Session: 50 min  Stress: No Stress Concern Present (12/14/2022)  [employer] of Occupational Health - Occupational Stress Questionnaire    Feeling of Stress : Not at all  Social Connections: Socially Integrated (07/11/2023)   Social Connection and Isolation Panel [NHANES]    Frequency of Communication with Friends and Family: More than three times a week    Frequency of Social Gatherings with Friends and Family: Twice a week    Attends Religious Services: More than 4 times per year    Active Member of [employer] or Organizations: No    Attends Engineer, structural: More than 4 times per year    Marital Status: Married    Objective:  BP 118/62 (BP Location: Left Arm, Patient Position: Sitting)   Pulse 77   Temp 97.8 F (36.6 C) (Temporal)   Ht 5\' 2"  (1.575 m)   Wt 146 lb (66.2 kg)   SpO2 100%   BMI 26.70 kg/m      01/11/2024    9:18 AM 08/03/2023   10:33 AM 07/11/2023    3:44 PM  BP/Weight  Systolic BP 118 116 128  Diastolic BP 62 66 60  Wt. (Lbs) 146 150 147  BMI 26.7 kg/m2 27.44 kg/m2 26.89 kg/m2    Physical Exam Vitals reviewed.  Constitutional:      Appearance: Normal appearance. She is normal weight.  Neck:     Vascular: No carotid bruit.  Cardiovascular:     Rate and Rhythm: Normal rate and regular rhythm.     Heart sounds: Normal heart sounds.  Pulmonary:     Effort: Pulmonary effort is normal. No respiratory distress.     Breath sounds: Normal breath sounds.  Abdominal:     General: Abdomen is flat. Bowel sounds are normal.     Palpations: Abdomen is soft.     Tenderness: There is no abdominal tenderness.  Neurological:     Mental Status: She is alert and oriented to person, place, and time.   Psychiatric:        Mood and Affect: Mood normal.        Behavior: Behavior normal.     Diabetic Foot Exam - Simple   No data filed      Lab Results  Component Value Date   WBC 6.1 01/11/2024   HGB 12.2 01/11/2024   HCT 37.6 01/11/2024   PLT 276 01/11/2024   GLUCOSE 100 (H) 01/11/2024   CHOL 138 01/11/2024   TRIG 87 01/11/2024   HDL 58 01/11/2024   LDLCALC 63 01/11/2024   ALT 9 01/11/2024   AST 22 01/11/2024   NA 143 01/11/2024   K 4.4 01/11/2024   CL 106 01/11/2024   CREATININE 1.03 (H) 01/11/2024   BUN 24 01/11/2024   CO2 23 01/11/2024   TSH 2.650 12/18/2022   INR 0.94 03/20/2017   HGBA1C 5.8 (H) 01/11/2024   MICROALBUR 30 12/06/2020      Assessment & Plan:  Hypertension associated with diabetes (HCC) Assessment & Plan: Stop amlodipine . Continue telmisartan . Check bp at home daily.   Orders: -     CBC with Differential/Platelet -     Comprehensive metabolic panel with GFR  Dyslipidemia associated with type 2 diabetes mellitus (HCC) Assessment & Plan: Control: good Recommend check sugars fasting daily. Recommend check feet daily. Recommend annual eye exams. Medicines: continue rosuvastatin  and fenofibrate .  Continue to work on eating a healthy diet and exercise.  Labs drawn today.     Orders: -     Hemoglobin A1c -  Lipid panel -     Microalbumin / creatinine urine ratio  Stage 3a chronic kidney disease (HCC) Assessment & Plan: Stable.    Aortic atherosclerosis (HCC) Assessment & Plan: Continue rosuvastatin  and fenofibrate .       No orders of the defined types were placed in this encounter.   Orders Placed This Encounter  Procedures   CBC with Differential/Platelet   Comprehensive metabolic panel with GFR   Hemoglobin A1c   Lipid panel   Microalbumin / creatinine urine ratio     Follow-up: Return in about 4 months (around 05/12/2024) for chronic fasting.   I,Marla I Leal-Borjas,acting as a scribe for Mercy Stall, MD.,have  documented all relevant documentation on the behalf of Mercy Stall, MD,as directed by  Mercy Stall, MD while in the presence of Mercy Stall, MD.   An After Visit Summary was printed and given to the patient.  I attest that I have reviewed this visit and agree with the plan scribed by my staff.   Mercy Stall, MD [facility] [phone]

## 2024-01-11 ENCOUNTER — Ambulatory Visit (INDEPENDENT_AMBULATORY_CARE_PROVIDER_SITE_OTHER): Payer: Medicare Other | Admitting: Family Medicine

## 2024-01-11 ENCOUNTER — Encounter: Payer: Self-pay | Admitting: Family Medicine

## 2024-01-11 VITALS — BP 118/62 | HR 77 | Temp 97.8°F | Ht 62.0 in | Wt 146.0 lb

## 2024-01-11 DIAGNOSIS — E1169 Type 2 diabetes mellitus with other specified complication: Secondary | ICD-10-CM

## 2024-01-11 DIAGNOSIS — I152 Hypertension secondary to endocrine disorders: Secondary | ICD-10-CM | POA: Diagnosis not present

## 2024-01-11 DIAGNOSIS — I700 Atherosclerosis of aorta: Secondary | ICD-10-CM | POA: Diagnosis not present

## 2024-01-11 DIAGNOSIS — E785 Hyperlipidemia, unspecified: Secondary | ICD-10-CM | POA: Diagnosis not present

## 2024-01-11 DIAGNOSIS — E1159 Type 2 diabetes mellitus with other circulatory complications: Secondary | ICD-10-CM

## 2024-01-11 DIAGNOSIS — N1831 Chronic kidney disease, stage 3a: Secondary | ICD-10-CM

## 2024-01-11 NOTE — Patient Instructions (Signed)
 Stop amlodipine . Continue telmisartan . Check bp at home daily.

## 2024-01-12 LAB — CBC WITH DIFFERENTIAL/PLATELET
Basophils Absolute: 0 10*3/uL (ref 0.0–0.2)
Basos: 1 %
EOS (ABSOLUTE): 0.2 10*3/uL (ref 0.0–0.4)
Eos: 3 %
Hematocrit: 37.6 % (ref 34.0–46.6)
Hemoglobin: 12.2 g/dL (ref 11.1–15.9)
Immature Grans (Abs): 0 10*3/uL (ref 0.0–0.1)
Immature Granulocytes: 0 %
Lymphocytes Absolute: 1.9 10*3/uL (ref 0.7–3.1)
Lymphs: 30 %
MCH: 29.9 pg (ref 26.6–33.0)
MCHC: 32.4 g/dL (ref 31.5–35.7)
MCV: 92 fL (ref 79–97)
Monocytes Absolute: 0.4 10*3/uL (ref 0.1–0.9)
Monocytes: 7 %
Neutrophils Absolute: 3.6 10*3/uL (ref 1.4–7.0)
Neutrophils: 59 %
Platelets: 276 10*3/uL (ref 150–450)
RBC: 4.08 x10E6/uL (ref 3.77–5.28)
RDW: 12.3 % (ref 11.7–15.4)
WBC: 6.1 10*3/uL (ref 3.4–10.8)

## 2024-01-12 LAB — MICROALBUMIN / CREATININE URINE RATIO
Creatinine, Urine: 166.4 mg/dL
Microalb/Creat Ratio: 12 mg/g{creat} (ref 0–29)
Microalbumin, Urine: 19.4 ug/mL

## 2024-01-12 LAB — LIPID PANEL
Chol/HDL Ratio: 2.4 ratio (ref 0.0–4.4)
Cholesterol, Total: 138 mg/dL (ref 100–199)
HDL: 58 mg/dL (ref >39)
LDL Chol Calc (NIH): 63 mg/dL (ref 0–99)
Triglycerides: 87 mg/dL (ref 0–149)
VLDL Cholesterol Cal: 17 mg/dL (ref 5–40)

## 2024-01-12 LAB — COMPREHENSIVE METABOLIC PANEL WITH GFR
ALT: 9 IU/L (ref 0–32)
AST: 22 IU/L (ref 0–40)
Albumin: 4.4 g/dL (ref 3.8–4.8)
Alkaline Phosphatase: 63 IU/L (ref 44–121)
BUN/Creatinine Ratio: 23 (ref 12–28)
BUN: 24 mg/dL (ref 8–27)
Bilirubin Total: 0.4 mg/dL (ref 0.0–1.2)
CO2: 23 mmol/L (ref 20–29)
Calcium: 9.9 mg/dL (ref 8.7–10.3)
Chloride: 106 mmol/L (ref 96–106)
Creatinine, Ser: 1.03 mg/dL — ABNORMAL HIGH (ref 0.57–1.00)
Globulin, Total: 2.3 g/dL (ref 1.5–4.5)
Glucose: 100 mg/dL — ABNORMAL HIGH (ref 70–99)
Potassium: 4.4 mmol/L (ref 3.5–5.2)
Sodium: 143 mmol/L (ref 134–144)
Total Protein: 6.7 g/dL (ref 6.0–8.5)
eGFR: 56 mL/min/{1.73_m2} — ABNORMAL LOW (ref >59)

## 2024-01-12 LAB — HEMOGLOBIN A1C
Est. average glucose Bld gHb Est-mCnc: 120 mg/dL
Hgb A1c MFr Bld: 5.8 % — ABNORMAL HIGH (ref 4.8–5.6)

## 2024-01-13 ENCOUNTER — Ambulatory Visit: Payer: Self-pay | Admitting: Family Medicine

## 2024-01-13 NOTE — Assessment & Plan Note (Addendum)
 Control: good Recommend check sugars fasting daily. Recommend check feet daily. Recommend annual eye exams. Medicines: continue rosuvastatin  and fenofibrate .  Continue to work on eating a healthy diet and exercise.  Labs drawn today.

## 2024-01-13 NOTE — Assessment & Plan Note (Signed)
 Continue rosuvastatin and fenofibrate.

## 2024-01-13 NOTE — Assessment & Plan Note (Signed)
 Stable

## 2024-01-13 NOTE — Assessment & Plan Note (Signed)
 Stop amlodipine . Continue telmisartan . Check bp at home daily.

## 2024-01-14 ENCOUNTER — Other Ambulatory Visit: Payer: Self-pay | Admitting: Family Medicine

## 2024-01-14 DIAGNOSIS — G8929 Other chronic pain: Secondary | ICD-10-CM | POA: Diagnosis not present

## 2024-01-14 DIAGNOSIS — M1712 Unilateral primary osteoarthritis, left knee: Secondary | ICD-10-CM | POA: Diagnosis not present

## 2024-01-14 MED ORDER — OZEMPIC (0.25 OR 0.5 MG/DOSE) 2 MG/3ML ~~LOC~~ SOPN: PEN_INJECTOR | SUBCUTANEOUS | 0 refills | Status: AC

## 2024-01-15 ENCOUNTER — Ambulatory Visit (INDEPENDENT_AMBULATORY_CARE_PROVIDER_SITE_OTHER)

## 2024-01-15 DIAGNOSIS — E785 Hyperlipidemia, unspecified: Secondary | ICD-10-CM | POA: Diagnosis not present

## 2024-01-15 DIAGNOSIS — Z7189 Other specified counseling: Secondary | ICD-10-CM | POA: Diagnosis not present

## 2024-01-15 DIAGNOSIS — E1169 Type 2 diabetes mellitus with other specified complication: Secondary | ICD-10-CM

## 2024-01-15 NOTE — Patient Instructions (Addendum)

## 2024-01-15 NOTE — Progress Notes (Signed)
      Patient came in today for Ozempic teaching. It was demonstrated to patient with training pen:   Patient instructed to clean area with alcohol swab in circular motion Attach needle and prime with first use of pen Turn dial until seeing the dosage prescribed. Then remove caps. Hold pen up to subcutaneous area & push button the bottom of the pen. When he see the number back to 0, he should hold pen in place for 10 additional seconds. Then injection is done & he can discard needle in sharps container/milk jug.   Pt states understanding and administered her first dose herself while in the office.  I assisted with completing patient assistance application for Ozempic.  Patient will come back in to office if she has questions regarding next weeks SQ injection.   Davee Erm, LPN 69/62/95 28:41 PM

## 2024-01-22 ENCOUNTER — Telehealth: Payer: Self-pay

## 2024-01-22 NOTE — Telephone Encounter (Signed)
 Copied from CRM [id number]. Topic: Medical Record Request - Provider/Facility Request >> Jan 22, 2024  9:37 AM Ivette P wrote: Reason for CRM: [employer] calling to inform that page 9 of the enrollment application to be resent with the total quantity being 4 boxes for the ozempic  medication. Also the formulation box right next to it will have to be checked as well.    Callback  [phone]  M-F 8AM- 8 PM EST

## 2024-02-02 DIAGNOSIS — M25562 Pain in left knee: Secondary | ICD-10-CM | POA: Diagnosis not present

## 2024-02-05 DIAGNOSIS — M25562 Pain in left knee: Secondary | ICD-10-CM | POA: Diagnosis not present

## 2024-02-15 NOTE — Telephone Encounter (Signed)
 Spoke with patient over the phone - she is doing well on the 0.25 mg weekly, denies nausea or low blood sugar readings.  Recommended increasing to the next dose at 0.5 mg weekly at her next injection date.  She will start this dose Tuesday, 02/19/24.

## 2024-02-18 ENCOUNTER — Telehealth: Payer: Self-pay

## 2024-02-18 NOTE — Telephone Encounter (Signed)
 Called and informed the patient that we have received her Patient assistance for her Ozempic  0.25/0.5 mg and its ready for pick up. Patient understood verbally.  Received Ozempic  0.25/0.5 mg 4 boxes NDC: [id number] Lot: [id number] Exp: 07/06/2026

## 2024-02-26 DIAGNOSIS — M1712 Unilateral primary osteoarthritis, left knee: Secondary | ICD-10-CM | POA: Diagnosis not present

## 2024-03-05 ENCOUNTER — Ambulatory Visit: Admitting: Family Medicine

## 2024-03-05 ENCOUNTER — Encounter: Payer: Self-pay | Admitting: Family Medicine

## 2024-03-05 ENCOUNTER — Ambulatory Visit (INDEPENDENT_AMBULATORY_CARE_PROVIDER_SITE_OTHER)
Admission: RE | Admit: 2024-03-05 | Discharge: 2024-03-05 | Disposition: A | Discharge status: Home / Self Care | Source: Ambulatory Visit | Attending: Family Medicine | Admitting: Family Medicine

## 2024-03-05 VITALS — BP 130/60 | HR 76 | Temp 97.9°F | Resp 16 | Ht 62.0 in | Wt 140.4 lb

## 2024-03-05 DIAGNOSIS — Z01818 Encounter for other preprocedural examination: Secondary | ICD-10-CM

## 2024-03-05 DIAGNOSIS — R82998 Other abnormal findings in urine: Secondary | ICD-10-CM

## 2024-03-05 DIAGNOSIS — I700 Atherosclerosis of aorta: Secondary | ICD-10-CM | POA: Diagnosis not present

## 2024-03-05 DIAGNOSIS — M1712 Unilateral primary osteoarthritis, left knee: Secondary | ICD-10-CM

## 2024-03-05 DIAGNOSIS — R0602 Shortness of breath: Secondary | ICD-10-CM | POA: Diagnosis not present

## 2024-03-05 DIAGNOSIS — N3000 Acute cystitis without hematuria: Secondary | ICD-10-CM

## 2024-03-05 LAB — POCT URINALYSIS DIP (CLINITEK)
Bilirubin, UA: NEGATIVE
Blood, UA: NEGATIVE
Glucose, UA: NEGATIVE mg/dL
Ketones, POC UA: NEGATIVE mg/dL
Nitrite, UA: NEGATIVE
POC PROTEIN,UA: NEGATIVE
Spec Grav, UA: 1.025 (ref 1.010–1.025)
Urobilinogen, UA: 0.2 U/dL
pH, UA: 6 (ref 5.0–8.0)

## 2024-03-05 NOTE — Assessment & Plan Note (Signed)
 Preoperative evaluation for left knee replacement scheduled for early to mid-September. Labs valid until April 12, 2024. EKG normal. - Order PT INR test. - Send UA for culture. - chest x-ray

## 2024-03-05 NOTE — Patient Instructions (Addendum)
  VISIT SUMMARY: You came in today for a pre-operative evaluation for your upcoming left knee replacement surgery. We reviewed your medical history, current medications, and recent lab results. Your EKG was normal, and your A1c level was 5.8%. We discussed your left knee pain, shortness of breath, and the presence of leukocytes in your urine. We also reviewed your hypertension and aortic atherosclerosis management.  YOUR PLAN: -PREOPERATIVE EVALUATION FOR LEFT KNEE REPLACEMENT: You are scheduled for a left knee replacement surgery in early to mid-September. Your recent lab tests and EKG are valid until April 12, 2024. We will order a PT INR test to check your blood clotting and send your urine for culture. We will also consult with Doctor Cox to determine if a chest x-ray is necessary.  -LEFT KNEE PAIN: You have chronic pain in your left knee, especially when walking. Given the success of your previous right knee replacement, the left knee replacement should help alleviate this pain.  -SHORTNESS OF BREATH: You experience shortness of breath occasionally, possibly due to stress. There is no chest pain or leg swelling. Doctor Cox about is aware this symptom.  -LEUKOCYTES IN URINE: The presence of leukocytes in your urine suggests a possible urinary tract infection (UTI). Although you do not have symptoms like pain or frequent urination, we will send your urine for culture to confirm.  -HYPERTENSION: Your hypertension is being managed with the medication Telmisartan . Continue taking your medication as prescribed.  -TYPE 2 DIABETES MELLITUS: Your Type 2 diabetes is well-controlled, as indicated by your A1c level of 5.8% from January 11, 2024. Keep up the good work managing your blood sugar levels.  -AORTIC ATHEROSCLEROSIS: Aortic atherosclerosis is a condition where the arteries in your heart are narrowed. We will continue to monitor this as part of your ongoing care.  INSTRUCTIONS: Please complete the  PT INR test as soon as possible. We will send your urine sample for culture and consult with Doctor Cox about the need for a chest x-ray. Follow up with us  if you experience any new symptoms or have any concerns before your surgery.

## 2024-03-05 NOTE — Progress Notes (Unsigned)
 Subjective:  Patient ID: Denise [name], female    DOB: 10-10-1945  Age: 78 y.o. MRN: [id number]  Chief Complaint  Patient presents with  . Pre-op Exam    Discussed the use of AI scribe software for clinical note transcription with the patient, who gave verbal consent to proceed.  History of Present Illness   Denise [name] is a 78 year old female with end stage osteoarthritis with mostly medial compartment involvement who presents for pre-operative evaluation for left knee replacement surgery.  She is to be scheduled for a left knee replacement surgery and is undergoing pre-operative evaluation. She previously had a right knee replacement, which is doing well. She experiences pain in the left knee, particularly when walking.  She has a history of hypertension, for which she takes Telmisartan . She also has a diagnosis of aortic atherosclerosis. No family history of adverse outcomes to anesthesia.  No dysuria, urinary frequency, urgency, chest pain, or leg swelling. She experiences shortness of breath when walking up stairs but manages to walk up steps at [employer] regularly. No decreased range of motion in her neck. Reports having some missing teeth.      Patient is here for a Preoperative physical at the request of Dr. Kay. She is having left knee surgery for end stage osteoarthritis with mostly medial compartment involvement.  Personal or family hx of adverse outcome to anesthesia? No  Chipped, cracked, missing, or loose teeth? Yes  Decreased ROM of neck? No  Able to walk up 2 flights of stairs without becoming significantly short of breath or having chest pain? Yes       07/11/2023    4:47 PM 04/05/2023    8:26 AM 12/18/2022    8:20 AM 06/13/2022    9:10 AM 12/09/2021    8:48 AM  Depression screen PHQ 2/9  Decreased Interest 0 0 0 0 0  Down, Depressed, Hopeless 0 0 0 0 0  PHQ - 2 Score 0 0 0 0 0  Altered sleeping  0 0    Tired, decreased energy  1 1    Change in  appetite  0 0    Feeling bad or failure about yourself   0 0    Trouble concentrating  0 0    Moving slowly or fidgety/restless  0 0    Suicidal thoughts  0 0    PHQ-9 Score  1 1    Difficult doing work/chores  Not difficult at all Not difficult at all          07/11/2023    4:47 PM  Fall Risk   Falls in the past year? 1  Number falls in past yr: 0  Injury with Fall? 0  Risk for fall due to : No Fall Risks  Follow up Falls evaluation completed    Patient Care Team: Sherre Clapper, MD as PCP - General (Family Medicine) Arloa Brunner, OD (Optometry)   Review of Systems  Constitutional:  Negative for chills, diaphoresis, fatigue and fever.  HENT:  Negative for congestion, ear pain and sinus pain.   Eyes: Negative.   Respiratory:  Positive for shortness of breath. Negative for cough, chest tightness and wheezing.   Cardiovascular:  Negative for chest pain and palpitations.  Gastrointestinal:  Negative for abdominal pain, constipation, diarrhea, nausea and vomiting.  Endocrine: Negative.   Genitourinary:  Negative for dysuria, frequency and urgency.  Musculoskeletal:  Negative for arthralgias.  Skin: Negative.   Allergic/Immunologic: Negative.  Neurological:  Negative for dizziness, weakness, light-headedness and headaches.  Hematological: Negative.   Psychiatric/Behavioral:  Negative for dysphoric mood. The patient is not nervous/anxious.     Current Outpatient Medications on File Prior to Visit  Medication Sig Dispense Refill  . Calcium  Carb-Cholecalciferol (CALCIUM  600 + D PO) Take 1 tablet by mouth daily.    . celecoxib  (CELEBREX ) 100 MG capsule TAKE 1 CAPSULE BY MOUTH EVERY DAY 90 capsule 1  . fenofibrate  160 MG tablet TAKE 1 TABLET BY MOUTH EVERY DAY 90 tablet 1  . mometasone (ELOCON) 0.1 % cream mometasone 0.1 % topical cream  APPLY SPARINGLY TO AFFECTED AREA EVERY DAY    . Multiple Vitamin (MULTIVITAMIN) tablet Take 1 tablet by mouth daily.    . Multiple  Vitamins-Minerals (PRESERVISION AREDS PO) Take by mouth.    . rosuvastatin  (CRESTOR ) 5 MG tablet TAKE 1 TABLET (5 MG TOTAL) BY MOUTH DAILY. 90 tablet 1  . telmisartan  (MICARDIS ) 80 MG tablet TAKE 1 TABLET BY MOUTH EVERY DAY 90 tablet 3  . vitamin C (ASCORBIC ACID) 500 MG tablet Take 500 mg by mouth daily.    . amLODipine  (NORVASC ) 5 MG tablet TAKE 1 TABLET (5 MG TOTAL) BY MOUTH DAILY. (Patient not taking: Reported on 03/05/2024) 90 tablet 1   No current facility-administered medications on file prior to visit.   Past Medical History:  Diagnosis Date  . Aortic atherosclerosis (HCC) 2020  . Arthritis   . Constipation   . Diabetes mellitus without complication (HCC)    diet controlled  . HTN (hypertension) 06/15/2015  . Pneumonia    Past Surgical History:  Procedure Laterality Date  . ANKLE SURGERY    . BREAST LUMPECTOMY     5 times  . CARPAL TUNNEL RELEASE     10/2015  . CATARACT EXTRACTION    . CHOLECYSTECTOMY    . METATARSAL OSTEOTOMY Left 12/09/2020   Procedure: Left second metatarsal phalangeal joint debridement, 2-3 weil osteotomies;  Surgeon: Kit Rush, MD;  Location: [facility];  Service: Orthopedics;  Laterality: Left;  60 mins [facility] vs [facility]  . NASAL SINUS SURGERY    . right knee arthroscopic    . ROTATOR CUFF REPAIR    . TOTAL KNEE ARTHROPLASTY Right 03/28/2017   Procedure: RIGHT TOTAL KNEE ARTHROPLASTY;  Surgeon: Heide Ingle, MD;  Location: [facility];  Service: Orthopedics;  Laterality: Right;  . TUBAL LIGATION      Family History  Problem Relation Age of Onset  . Liver disease Father   . Cirrhosis Father   . Bone cancer Mother   . Breast cancer Other        aunts  . Colon cancer Neg Hx    Social History   Socioeconomic History  . Marital status: Married    Spouse name: Not on file  . Number of children: Not on file  . Years of education: Not on file  . Highest education level: 12th grade  Occupational History  . Occupation: Self  Employed  Tobacco Use  . Smoking status: Never  . Smokeless tobacco: Never  Vaping Use  . Vaping status: Never Used  Substance and Sexual Activity  . Alcohol use: No  . Drug use: No  . Sexual activity: Yes  Other Topics Concern  . Not on file  Social History Narrative  . Not on file   Social Drivers of Health   Financial Resource Strain: Low Risk  (12/14/2022)   Overall Financial Resource Strain (CARDIA)   .  Difficulty of Paying Living Expenses: Not hard at all  Food Insecurity: No Food Insecurity (12/14/2022)   Hunger Vital Sign   . Worried About Programme researcher, broadcasting/film/video in the Last Year: Never true   . Ran Out of Food in the Last Year: Never true  Transportation Needs: No Transportation Needs (12/14/2022)   PRAPARE - Transportation   . Lack of Transportation (Medical): No   . Lack of Transportation (Non-Medical): No  Physical Activity: Insufficiently Active (12/14/2022)   Exercise Vital Sign   . Days of Exercise per Week: 2 days   . Minutes of Exercise per Session: 50 min  Stress: No Stress Concern Present (12/14/2022)   [employer] of Occupational Health - Occupational Stress Questionnaire   . Feeling of Stress : Not at all  Social Connections: Socially Integrated (07/11/2023)   Social Connection and Isolation Panel   . Frequency of Communication with Friends and Family: More than three times a week   . Frequency of Social Gatherings with Friends and Family: Twice a week   . Attends Religious Services: More than 4 times per year   . Active Member of Clubs or Organizations: No   . Attends Banker Meetings: More than 4 times per year   . Marital Status: Married    Objective:  BP 130/60   Pulse 76   Temp 97.9 F (36.6 C) (Temporal)   Resp 16   Ht 5' 2 (1.575 m)   Wt 140 lb 6.4 oz (63.7 kg)   SpO2 98%   BMI 25.68 kg/m      03/05/2024    8:08 AM 01/11/2024    9:18 AM 08/03/2023   10:33 AM  BP/Weight  Systolic BP 130 118 116  Diastolic BP 60 62 66  Wt.  (Lbs) 140.4 146 150  BMI 25.68 kg/m2 26.7 kg/m2 27.44 kg/m2    Physical Exam Vitals reviewed.  Constitutional:      General: She is not in acute distress.    Appearance: Normal appearance. She is not ill-appearing.  Eyes:     Conjunctiva/sclera: Conjunctivae normal.  Cardiovascular:     Rate and Rhythm: Normal rate and regular rhythm.     Heart sounds: Normal heart sounds. No murmur heard. Pulmonary:     Effort: Pulmonary effort is normal.     Breath sounds: Normal breath sounds. No wheezing.  Musculoskeletal:     Right knee: Normal.     Left knee: Decreased range of motion. Tenderness (with walking) present.  Skin:    General: Skin is warm.  Neurological:     Mental Status: She is alert. Mental status is at baseline.  Psychiatric:        Mood and Affect: Mood normal.        Behavior: Behavior normal.       Lab Results  Component Value Date   WBC 6.1 01/11/2024   HGB 12.2 01/11/2024   HCT 37.6 01/11/2024   PLT 276 01/11/2024   GLUCOSE 100 (H) 01/11/2024   CHOL 138 01/11/2024   TRIG 87 01/11/2024   HDL 58 01/11/2024   LDLCALC 63 01/11/2024   ALT 9 01/11/2024   AST 22 01/11/2024   NA 143 01/11/2024   K 4.4 01/11/2024   CL 106 01/11/2024   CREATININE 1.03 (H) 01/11/2024   BUN 24 01/11/2024   CO2 23 01/11/2024   TSH 2.650 12/18/2022   INR 0.94 03/20/2017   HGBA1C 5.8 (H) 01/11/2024   MICROALBUR 30 12/06/2020  Assessment & Plan:  There are no diagnoses linked to this encounter.   No orders of the defined types were placed in this encounter.   No orders of the defined types were placed in this encounter.   Assessment and Plan    Preoperative evaluation for left knee replacement Preoperative evaluation for left knee replacement scheduled for early to mid-September. Labs valid until April 12, 2024. EKG normal. - Order PT INR test. - Send UA for culture. - Consult Doctor Cox regarding chest x-ray necessity.  Left knee pain Chronic pain  exacerbated by walking. Previous right knee replacement successful, suggesting benefit from left knee replacement.  Shortness of breath Intermittent shortness of breath, possibly stress-related. No chest pain or leg swelling. Doctor Cox informed.  Leukocytes in urine Leukocytes in urine suggest possible UTI. No dysuria, frequency, or urgency. - Send urine for culture.  Hypertension Hypertension managed with telmisartan .  Type 2 diabetes mellitus Type 2 diabetes mellitus well-controlled with A1c of 5.8% as of January 11, 2024.  Aortic atherosclerosis Aortic atherosclerosis noted in medical history.       Follow-up: No follow-ups on file.   I,Angela Taylor,acting as a Neurosurgeon for Harrie CHRISTELLA Cedar, FNP.,have documented all relevant documentation on the behalf of Harrie CHRISTELLA Cedar, FNP,as directed by  Harrie CHRISTELLA Cedar, FNP while in the presence of Harrie CHRISTELLA Cedar, FNP.   An After Visit Summary was printed and given to the patient.  Harrie CHRISTELLA Cedar, FNP [facility] [phone]

## 2024-03-06 ENCOUNTER — Ambulatory Visit: Payer: Self-pay | Admitting: Family Medicine

## 2024-03-06 DIAGNOSIS — R82998 Other abnormal findings in urine: Secondary | ICD-10-CM | POA: Insufficient documentation

## 2024-03-06 DIAGNOSIS — M1712 Unilateral primary osteoarthritis, left knee: Secondary | ICD-10-CM | POA: Insufficient documentation

## 2024-03-06 LAB — PROTIME-INR
INR: 1 (ref 0.9–1.2)
Prothrombin Time: 11.3 s (ref 9.1–12.0)

## 2024-03-06 NOTE — Assessment & Plan Note (Signed)
 Incidental Finding during preoperative clearance for knee surgery.  - Urine culture sent

## 2024-03-06 NOTE — Assessment & Plan Note (Signed)
 Preoperative EKG requested prior to knee surgery. EKG normal. NSR 72, PR - 138, QRS - 78, QTc - 427. NO ST elevations or depressions.

## 2024-03-06 NOTE — Assessment & Plan Note (Signed)
 Chronic pain exacerbated by walking. Previous right knee replacement successful, suggesting benefit from left knee replacement.  Preoperative paperwork, labs, EKG and chest xray completed today. - Schedule surgery for beginning of September.

## 2024-03-07 LAB — URINE CULTURE

## 2024-03-24 ENCOUNTER — Other Ambulatory Visit: Payer: Self-pay | Admitting: Family Medicine

## 2024-03-24 DIAGNOSIS — Z1231 Encounter for screening mammogram for malignant neoplasm of breast: Secondary | ICD-10-CM

## 2024-03-26 ENCOUNTER — Other Ambulatory Visit: Payer: Self-pay

## 2024-03-26 ENCOUNTER — Other Ambulatory Visit

## 2024-03-26 ENCOUNTER — Other Ambulatory Visit: Payer: Self-pay | Admitting: Family Medicine

## 2024-03-26 DIAGNOSIS — Z01818 Encounter for other preprocedural examination: Secondary | ICD-10-CM

## 2024-03-26 DIAGNOSIS — I10 Essential (primary) hypertension: Secondary | ICD-10-CM

## 2024-03-26 DIAGNOSIS — R791 Abnormal coagulation profile: Secondary | ICD-10-CM

## 2024-03-27 LAB — CBC WITH DIFFERENTIAL/PLATELET
Basophils Absolute: 0 x10E3/uL (ref 0.0–0.2)
Basos: 1 %
EOS (ABSOLUTE): 0.2 x10E3/uL (ref 0.0–0.4)
Eos: 4 %
Hematocrit: 35.6 % (ref 34.0–46.6)
Hemoglobin: 11.4 g/dL (ref 11.1–15.9)
Immature Grans (Abs): 0 x10E3/uL (ref 0.0–0.1)
Immature Granulocytes: 0 %
Lymphocytes Absolute: 1.7 x10E3/uL (ref 0.7–3.1)
Lymphs: 30 %
MCH: 30.5 pg (ref 26.6–33.0)
MCHC: 32 g/dL (ref 31.5–35.7)
MCV: 95 fL (ref 79–97)
Monocytes Absolute: 0.4 x10E3/uL (ref 0.1–0.9)
Monocytes: 7 %
Neutrophils Absolute: 3.4 x10E3/uL (ref 1.4–7.0)
Neutrophils: 58 %
Platelets: 302 x10E3/uL (ref 150–450)
RBC: 3.74 x10E6/uL — ABNORMAL LOW (ref 3.77–5.28)
RDW: 12.4 % (ref 11.7–15.4)
WBC: 5.8 x10E3/uL (ref 3.4–10.8)

## 2024-03-27 LAB — COMPREHENSIVE METABOLIC PANEL WITH GFR
ALT: 14 [IU]/L (ref 0–32)
AST: 33 [IU]/L (ref 0–40)
Albumin: 4.3 g/dL (ref 3.8–4.8)
Alkaline Phosphatase: 60 [IU]/L (ref 44–121)
BUN/Creatinine Ratio: 23 (ref 12–28)
BUN: 25 mg/dL (ref 8–27)
Bilirubin Total: 0.4 mg/dL (ref 0.0–1.2)
CO2: 23 mmol/L (ref 20–29)
Calcium: 9.8 mg/dL (ref 8.7–10.3)
Chloride: 105 mmol/L (ref 96–106)
Creatinine, Ser: 1.11 mg/dL — ABNORMAL HIGH (ref 0.57–1.00)
Globulin, Total: 1.8 g/dL (ref 1.5–4.5)
Glucose: 88 mg/dL (ref 70–99)
Potassium: 4.3 mmol/L (ref 3.5–5.2)
Sodium: 141 mmol/L (ref 134–144)
Total Protein: 6.1 g/dL (ref 6.0–8.5)
eGFR: 51 mL/min/{1.73_m2} — ABNORMAL LOW

## 2024-03-27 LAB — PROTIME-INR
INR: 1 (ref 0.9–1.2)
Prothrombin Time: 11.1 s (ref 9.1–12.0)

## 2024-03-30 ENCOUNTER — Ambulatory Visit: Payer: Self-pay | Admitting: Family Medicine

## 2024-04-15 ENCOUNTER — Ambulatory Visit (INDEPENDENT_AMBULATORY_CARE_PROVIDER_SITE_OTHER)

## 2024-04-15 DIAGNOSIS — Z23 Encounter for immunization: Secondary | ICD-10-CM

## 2024-04-15 NOTE — Progress Notes (Signed)
 Patient is in office today for a nurse visit for Immunization. Patient Injection was given in the  Left deltoid. Patient tolerated injection well.

## 2024-04-19 ENCOUNTER — Other Ambulatory Visit: Payer: Self-pay | Admitting: Family Medicine

## 2024-04-19 DIAGNOSIS — E782 Mixed hyperlipidemia: Secondary | ICD-10-CM

## 2024-04-21 DIAGNOSIS — M1712 Unilateral primary osteoarthritis, left knee: Secondary | ICD-10-CM | POA: Diagnosis not present

## 2024-04-21 DIAGNOSIS — M25762 Osteophyte, left knee: Secondary | ICD-10-CM | POA: Diagnosis not present

## 2024-04-24 DIAGNOSIS — R269 Unspecified abnormalities of gait and mobility: Secondary | ICD-10-CM | POA: Diagnosis not present

## 2024-04-24 DIAGNOSIS — M25662 Stiffness of left knee, not elsewhere classified: Secondary | ICD-10-CM | POA: Diagnosis not present

## 2024-04-24 DIAGNOSIS — M25562 Pain in left knee: Secondary | ICD-10-CM | POA: Diagnosis not present

## 2024-04-24 DIAGNOSIS — R29898 Other symptoms and signs involving the musculoskeletal system: Secondary | ICD-10-CM | POA: Diagnosis not present

## 2024-04-24 DIAGNOSIS — G8929 Other chronic pain: Secondary | ICD-10-CM | POA: Diagnosis not present

## 2024-04-28 DIAGNOSIS — M25562 Pain in left knee: Secondary | ICD-10-CM | POA: Diagnosis not present

## 2024-04-28 DIAGNOSIS — T84033D Mechanical loosening of internal left knee prosthetic joint, subsequent encounter: Secondary | ICD-10-CM | POA: Diagnosis not present

## 2024-04-28 DIAGNOSIS — M25662 Stiffness of left knee, not elsewhere classified: Secondary | ICD-10-CM | POA: Diagnosis not present

## 2024-04-28 DIAGNOSIS — G8929 Other chronic pain: Secondary | ICD-10-CM | POA: Diagnosis not present

## 2024-04-28 DIAGNOSIS — R269 Unspecified abnormalities of gait and mobility: Secondary | ICD-10-CM | POA: Diagnosis not present

## 2024-04-30 DIAGNOSIS — M25662 Stiffness of left knee, not elsewhere classified: Secondary | ICD-10-CM | POA: Diagnosis not present

## 2024-04-30 DIAGNOSIS — R269 Unspecified abnormalities of gait and mobility: Secondary | ICD-10-CM | POA: Diagnosis not present

## 2024-04-30 DIAGNOSIS — M25562 Pain in left knee: Secondary | ICD-10-CM | POA: Diagnosis not present

## 2024-04-30 DIAGNOSIS — T84033D Mechanical loosening of internal left knee prosthetic joint, subsequent encounter: Secondary | ICD-10-CM | POA: Diagnosis not present

## 2024-04-30 DIAGNOSIS — G8929 Other chronic pain: Secondary | ICD-10-CM | POA: Diagnosis not present

## 2024-05-01 DIAGNOSIS — Z4789 Encounter for other orthopedic aftercare: Secondary | ICD-10-CM | POA: Diagnosis not present

## 2024-05-05 ENCOUNTER — Telehealth: Payer: Self-pay

## 2024-05-05 DIAGNOSIS — T84033D Mechanical loosening of internal left knee prosthetic joint, subsequent encounter: Secondary | ICD-10-CM | POA: Diagnosis not present

## 2024-05-05 DIAGNOSIS — M25662 Stiffness of left knee, not elsewhere classified: Secondary | ICD-10-CM | POA: Diagnosis not present

## 2024-05-05 DIAGNOSIS — R269 Unspecified abnormalities of gait and mobility: Secondary | ICD-10-CM | POA: Diagnosis not present

## 2024-05-05 DIAGNOSIS — G8929 Other chronic pain: Secondary | ICD-10-CM | POA: Diagnosis not present

## 2024-05-05 DIAGNOSIS — M25562 Pain in left knee: Secondary | ICD-10-CM | POA: Diagnosis not present

## 2024-05-05 NOTE — Telephone Encounter (Signed)
 LM for patient regarding Ozempic  - I need to verify what dose she is on to complete patient assistance refill request.

## 2024-05-06 NOTE — Telephone Encounter (Signed)
 Patient stated that she hasn't taken the medication since August and wasn't sure of the dosage.Patient stated that she had knee surgery and has stopped taking the Ozempic  for the time being. Does not want a refill at this time.

## 2024-05-07 DIAGNOSIS — R269 Unspecified abnormalities of gait and mobility: Secondary | ICD-10-CM | POA: Diagnosis not present

## 2024-05-07 DIAGNOSIS — M25662 Stiffness of left knee, not elsewhere classified: Secondary | ICD-10-CM | POA: Diagnosis not present

## 2024-05-07 DIAGNOSIS — M25562 Pain in left knee: Secondary | ICD-10-CM | POA: Diagnosis not present

## 2024-05-07 DIAGNOSIS — T84033D Mechanical loosening of internal left knee prosthetic joint, subsequent encounter: Secondary | ICD-10-CM | POA: Diagnosis not present

## 2024-05-07 DIAGNOSIS — G8929 Other chronic pain: Secondary | ICD-10-CM | POA: Diagnosis not present

## 2024-05-07 NOTE — Telephone Encounter (Signed)
 Copied from CRM #[id number]. Topic: Clinical - Medical Advice >> May 07, 2024 10:34 AM Wyona SQUIBB wrote: Reason for CRM: pt has to do a urine specimen for her appt on Monday 10/06 - pt would like to know if he can do it at home because when doing it at office does not catch it.   Pt also had knee surgery and would like to notify she can do better at home.   Pt would ike to pick up the sample container today.   Please call pt, before 2:00pm if possible.

## 2024-05-09 ENCOUNTER — Ambulatory Visit
Admission: RE | Admit: 2024-05-09 | Discharge: 2024-05-09 | Disposition: A | Discharge status: Home / Self Care | Source: Ambulatory Visit | Attending: Family Medicine | Admitting: Family Medicine

## 2024-05-09 DIAGNOSIS — T84033D Mechanical loosening of internal left knee prosthetic joint, subsequent encounter: Secondary | ICD-10-CM | POA: Diagnosis not present

## 2024-05-09 DIAGNOSIS — G8929 Other chronic pain: Secondary | ICD-10-CM | POA: Diagnosis not present

## 2024-05-09 DIAGNOSIS — R269 Unspecified abnormalities of gait and mobility: Secondary | ICD-10-CM | POA: Diagnosis not present

## 2024-05-09 DIAGNOSIS — Z1231 Encounter for screening mammogram for malignant neoplasm of breast: Secondary | ICD-10-CM

## 2024-05-09 DIAGNOSIS — M25662 Stiffness of left knee, not elsewhere classified: Secondary | ICD-10-CM | POA: Diagnosis not present

## 2024-05-09 DIAGNOSIS — M25562 Pain in left knee: Secondary | ICD-10-CM | POA: Diagnosis not present

## 2024-05-11 NOTE — Progress Notes (Unsigned)
 Subjective:  Patient ID: Denise [name], female    DOB: 31-Jan-1946  Age: 78 y.o. MRN: [id number]  No chief complaint on file.   HPI: Discussed the use of AI scribe software for clinical note transcription with the patient, who gave verbal consent to proceed.  History of Present Illness  Diabetes:  Complications: hyperlipidemia, hypertension Glucose checking: not checking Glucose logs: No Hypoglycemia:No Most recent A1C: 6.2 Current medications: None- Patient is diet controlled. Checking feet daily.   Hyperlipidemia: Current medications: Fenofibrate  160mg  1 tablet once daily, rosuvastatin  5 mg once daily.  NO side effects.   Hypertension: Current medications: Amlodipine  5mg  1 tablet daily, Telmisartan  80mg  1 tablet daily.   Back pain:  evaluated by Dr. Terrie at [facility] and given back injection. Patient has knee pain. Has an appointment with Dr, Terrie on Monday.   Diet:fairly healthy Exercise:  Walking limited due to knee pain.      07/11/2023    4:47 PM 04/05/2023    8:26 AM 12/18/2022    8:20 AM 06/13/2022    9:10 AM 12/09/2021    8:48 AM  Depression screen PHQ 2/9  Decreased Interest 0 0 0 0 0  Down, Depressed, Hopeless 0 0 0 0 0  PHQ - 2 Score 0 0 0 0 0  Altered sleeping  0 0    Tired, decreased energy  1 1    Change in appetite  0 0    Feeling bad or failure about yourself   0 0    Trouble concentrating  0 0    Moving slowly or fidgety/restless  0 0    Suicidal thoughts  0 0    PHQ-9 Score  1 1    Difficult doing work/chores  Not difficult at all Not difficult at all          07/11/2023    4:47 PM  Fall Risk   Falls in the past year? 1  Number falls in past yr: 0  Injury with Fall? 0  Risk for fall due to : No Fall Risks  Follow up Falls evaluation completed    Patient Care Team: Sherre Clapper, MD as PCP - General (Family Medicine) Arloa Brunner, OD (Optometry)   Review of Systems  Current Outpatient Medications on File Prior to  Visit  Medication Sig Dispense Refill   amLODipine  (NORVASC ) 5 MG tablet TAKE 1 TABLET (5 MG TOTAL) BY MOUTH DAILY. 90 tablet 1   Calcium  Carb-Cholecalciferol (CALCIUM  600 + D PO) Take 1 tablet by mouth daily.     celecoxib  (CELEBREX ) 100 MG capsule TAKE 1 CAPSULE BY MOUTH EVERY DAY 90 capsule 1   fenofibrate  160 MG tablet TAKE 1 TABLET BY MOUTH EVERY DAY 90 tablet 1   mometasone (ELOCON) 0.1 % cream mometasone 0.1 % topical cream  APPLY SPARINGLY TO AFFECTED AREA EVERY DAY     Multiple Vitamin (MULTIVITAMIN) tablet Take 1 tablet by mouth daily.     Multiple Vitamins-Minerals (PRESERVISION AREDS PO) Take by mouth.     rosuvastatin  (CRESTOR ) 5 MG tablet TAKE 1 TABLET (5 MG TOTAL) BY MOUTH DAILY. 90 tablet 1   telmisartan  (MICARDIS ) 80 MG tablet TAKE 1 TABLET BY MOUTH EVERY DAY 90 tablet 3   vitamin C (ASCORBIC ACID) 500 MG tablet Take 500 mg by mouth daily.     No current facility-administered medications on file prior to visit.   Past Medical History:  Diagnosis Date   Aortic atherosclerosis 2020   Arthritis  Constipation    Diabetes mellitus without complication (HCC)    diet controlled   HTN (hypertension) 06/15/2015   Pneumonia    Past Surgical History:  Procedure Laterality Date   ANKLE SURGERY     BREAST LUMPECTOMY     5 times   CARPAL TUNNEL RELEASE     10/2015   CATARACT EXTRACTION     CHOLECYSTECTOMY     METATARSAL OSTEOTOMY Left 12/09/2020   Procedure: Left second metatarsal phalangeal joint debridement, 2-3 weil osteotomies;  Surgeon: Kit Rush, MD;  Location: [facility];  Service: Orthopedics;  Laterality: Left;  60 mins [facility] vs [facility]   NASAL SINUS SURGERY     right knee arthroscopic     ROTATOR CUFF REPAIR     TOTAL KNEE ARTHROPLASTY Right 03/28/2017   Procedure: RIGHT TOTAL KNEE ARTHROPLASTY;  Surgeon: Heide Ingle, MD;  Location: [facility];  Service: Orthopedics;  Laterality: Right;   TUBAL LIGATION      Family History  Problem  Relation Age of Onset   Liver disease Father    Cirrhosis Father    Bone cancer Mother    Breast cancer Other        aunts   Colon cancer Neg Hx    Social History   Socioeconomic History   Marital status: Married    Spouse name: Not on file   Number of children: Not on file   Years of education: Not on file   Highest education level: 12th grade  Occupational History   Occupation: Self Employed  Tobacco Use   Smoking status: Never   Smokeless tobacco: Never  Vaping Use   Vaping status: Never Used  Substance and Sexual Activity   Alcohol use: No   Drug use: No   Sexual activity: Yes  Other Topics Concern   Not on file  Social History Narrative   Not on file   Social Drivers of Health   Financial Resource Strain: Low Risk  (12/14/2022)   Overall Financial Resource Strain (CARDIA)    Difficulty of Paying Living Expenses: Not hard at all  Food Insecurity: No Food Insecurity (12/14/2022)   Hunger Vital Sign    Worried About Running Out of Food in the Last Year: Never true    Ran Out of Food in the Last Year: Never true  Transportation Needs: No Transportation Needs (12/14/2022)   PRAPARE - Administrator, Civil Service (Medical): No    Lack of Transportation (Non-Medical): No  Physical Activity: Insufficiently Active (12/14/2022)   Exercise Vital Sign    Days of Exercise per Week: 2 days    Minutes of Exercise per Session: 50 min  Stress: No Stress Concern Present (12/14/2022)   [employer] of Occupational Health - Occupational Stress Questionnaire    Feeling of Stress : Not at all  Social Connections: Socially Integrated (07/11/2023)   Social Connection and Isolation Panel    Frequency of Communication with Friends and Family: More than three times a week    Frequency of Social Gatherings with Friends and Family: Twice a week    Attends Religious Services: More than 4 times per year    Active Member of [employer] or Organizations: No    Attends Museum/gallery exhibitions officer: More than 4 times per year    Marital Status: Married    Objective:  There were no vitals taken for this visit.     03/05/2024    8:08 AM 01/11/2024  9:18 AM 08/03/2023   10:33 AM  BP/Weight  Systolic BP 130 118 116  Diastolic BP 60 62 66  Wt. (Lbs) 140.4 146 150  BMI 25.68 kg/m2 26.7 kg/m2 27.44 kg/m2    Physical Exam  {Perform Simple Foot Exam  Perform Detailed exam:1} {Insert foot Exam (Optional):30965}   Lab Results  Component Value Date   WBC 5.8 03/26/2024   HGB 11.4 03/26/2024   HCT 35.6 03/26/2024   PLT 302 03/26/2024   GLUCOSE 88 03/26/2024   CHOL 138 01/11/2024   TRIG 87 01/11/2024   HDL 58 01/11/2024   LDLCALC 63 01/11/2024   ALT 14 03/26/2024   AST 33 03/26/2024   NA 141 03/26/2024   K 4.3 03/26/2024   CL 105 03/26/2024   CREATININE 1.11 (H) 03/26/2024   BUN 25 03/26/2024   CO2 23 03/26/2024   TSH 2.650 12/18/2022   INR 1.0 03/26/2024   HGBA1C 5.8 (H) 01/11/2024    Results for orders placed or performed in visit on 03/26/24  CBC with Differential   Collection Time: 03/26/24  3:02 PM  Result Value Ref Range   WBC 5.8 3.4 - 10.8 x10E3/uL   RBC 3.74 (L) 3.77 - 5.28 x10E6/uL   Hemoglobin 11.4 11.1 - 15.9 g/dL   Hematocrit 64.3 65.9 - 46.6 %   MCV 95 79 - 97 fL   MCH 30.5 26.6 - 33.0 pg   MCHC 32.0 31.5 - 35.7 g/dL   RDW 87.5 88.2 - 84.5 %   Platelets 302 150 - 450 x10E3/uL   Neutrophils 58 Not Estab. %   Lymphs 30 Not Estab. %   Monocytes 7 Not Estab. %   Eos 4 Not Estab. %   Basos 1 Not Estab. %   Neutrophils Absolute 3.4 1.4 - 7.0 x10E3/uL   Lymphocytes Absolute 1.7 0.7 - 3.1 x10E3/uL   Monocytes Absolute 0.4 0.1 - 0.9 x10E3/uL   EOS (ABSOLUTE) 0.2 0.0 - 0.4 x10E3/uL   Basophils Absolute 0.0 0.0 - 0.2 x10E3/uL   Immature Granulocytes 0 Not Estab. %   Immature Grans (Abs) 0.0 0.0 - 0.1 x10E3/uL  Protime-INR   Collection Time: 03/26/24  3:02 PM  Result Value Ref Range   INR 1.0 0.9 - 1.2   Prothrombin Time  11.1 9.1 - 12.0 sec  Comprehensive metabolic panel   Collection Time: 03/26/24  3:02 PM  Result Value Ref Range   Glucose 88 70 - 99 mg/dL   BUN 25 8 - 27 mg/dL   Creatinine, Ser 8.88 (H) 0.57 - 1.00 mg/dL   eGFR 51 (L) >40 fO/fpw/8.26   BUN/Creatinine Ratio 23 12 - 28   Sodium 141 134 - 144 mmol/L   Potassium 4.3 3.5 - 5.2 mmol/L   Chloride 105 96 - 106 mmol/L   CO2 23 20 - 29 mmol/L   Calcium  9.8 8.7 - 10.3 mg/dL   Total Protein 6.1 6.0 - 8.5 g/dL   Albumin 4.3 3.8 - 4.8 g/dL   Globulin, Total 1.8 1.5 - 4.5 g/dL   Bilirubin Total 0.4 0.0 - 1.2 mg/dL   Alkaline Phosphatase 60 44 - 121 IU/L   AST 33 0 - 40 IU/L   ALT 14 0 - 32 IU/L  .  Assessment & Plan:   Assessment & Plan Essential hypertension, benign     Combined hyperlipidemia associated with type 2 diabetes mellitus (HCC)       There is no height or weight on file to calculate BMI.  Assessment  and Plan Assessment & Plan      No orders of the defined types were placed in this encounter.   No orders of the defined types were placed in this encounter.      Follow-up: No follow-ups on file.  An After Visit Summary was printed and given to the patient.  Abigail Free, MD [facility] [phone]

## 2024-05-11 NOTE — Assessment & Plan Note (Signed)
 SABRA

## 2024-05-11 NOTE — Assessment & Plan Note (Signed)
 Denise [name]

## 2024-05-12 ENCOUNTER — Encounter: Payer: Self-pay | Admitting: Family Medicine

## 2024-05-12 ENCOUNTER — Ambulatory Visit: Admitting: Family Medicine

## 2024-05-12 VITALS — BP 128/68 | HR 84 | Temp 97.8°F | Ht 62.0 in | Wt 136.2 lb

## 2024-05-12 DIAGNOSIS — E782 Mixed hyperlipidemia: Secondary | ICD-10-CM

## 2024-05-12 DIAGNOSIS — I10 Essential (primary) hypertension: Secondary | ICD-10-CM

## 2024-05-12 DIAGNOSIS — Z96651 Presence of right artificial knee joint: Secondary | ICD-10-CM | POA: Diagnosis not present

## 2024-05-12 DIAGNOSIS — R82998 Other abnormal findings in urine: Secondary | ICD-10-CM

## 2024-05-12 DIAGNOSIS — E1169 Type 2 diabetes mellitus with other specified complication: Secondary | ICD-10-CM | POA: Diagnosis not present

## 2024-05-12 DIAGNOSIS — Z23 Encounter for immunization: Secondary | ICD-10-CM

## 2024-05-12 LAB — POCT URINALYSIS DIP (CLINITEK)
Bilirubin, UA: NEGATIVE
Blood, UA: NEGATIVE
Glucose, UA: NEGATIVE mg/dL
Ketones, POC UA: NEGATIVE mg/dL
Nitrite, UA: NEGATIVE
POC PROTEIN,UA: NEGATIVE
Spec Grav, UA: 1.015 (ref 1.010–1.025)
Urobilinogen, UA: NEGATIVE U/dL
pH, UA: 6.5 (ref 5.0–8.0)

## 2024-05-12 LAB — POCT LIPID PANEL
HDL: 53
LDL: 53
Non-HDL: 73
TC/HDL: 1
TC: 127
TRG: 99

## 2024-05-12 LAB — POCT GLYCOSYLATED HEMOGLOBIN (HGB A1C): HbA1c POC (<> result, manual entry): 5.3 % (ref 4.0–5.6)

## 2024-05-12 NOTE — Patient Instructions (Signed)
  VISIT SUMMARY: Today, we discussed your postoperative knee pain, medication management, and overall health. Your diabetes and hypertension are well-controlled, and we reviewed your current medications and pain management strategies.  YOUR PLAN: TYPE 2 DIABETES MELLITUS: Your diabetes is well-controlled with a current A1c of 5.3. -Continue with your dietary modifications and protein supplementation. -Monitor your weight and consider resuming Ozempic  if you notice any weight gain.  ESSENTIAL HYPERTENSION: Your blood pressure is stable with your current medication. -Continue taking telmisartan  80 mg once daily.  HYPERLIPIDEMIA: Your cholesterol levels are well-controlled with your current medications. -Continue taking fenofibrate  and rosuvastatin .  CHRONIC PAIN OF RIGHT KNEE AFTER KNEE REPLACEMENT: You are experiencing significant knee pain three weeks after your knee replacement surgery. -Consider restarting Celebrex  after consulting with your orthopedic surgeon. -Continue physical therapy twice a week. -Use oxycodone  and tylenol  for pain management as needed. -Follow up with your orthopedic surgeon on October 23rd.  GENERAL HEALTH MAINTENANCE: Your general health maintenance is up to date. -Get your COVID-19 vaccine as planned. -Complete your eye exam on October 15th and ensure the records are sent to your primary care provider.                      Contains text generated by [employer]bridge.                                 Contains text generated by [employer]bridge.

## 2024-05-13 ENCOUNTER — Ambulatory Visit: Payer: Self-pay | Admitting: Family Medicine

## 2024-05-14 ENCOUNTER — Telehealth: Payer: Self-pay

## 2024-05-14 DIAGNOSIS — T84033D Mechanical loosening of internal left knee prosthetic joint, subsequent encounter: Secondary | ICD-10-CM | POA: Diagnosis not present

## 2024-05-14 DIAGNOSIS — G8929 Other chronic pain: Secondary | ICD-10-CM | POA: Diagnosis not present

## 2024-05-14 DIAGNOSIS — M25562 Pain in left knee: Secondary | ICD-10-CM | POA: Diagnosis not present

## 2024-05-14 DIAGNOSIS — M25662 Stiffness of left knee, not elsewhere classified: Secondary | ICD-10-CM | POA: Diagnosis not present

## 2024-05-14 DIAGNOSIS — R269 Unspecified abnormalities of gait and mobility: Secondary | ICD-10-CM | POA: Diagnosis not present

## 2024-05-14 NOTE — Telephone Encounter (Signed)
 Left detailed message on patients voicemail informing her she may restart her celebrex .

## 2024-05-14 NOTE — Assessment & Plan Note (Signed)
  Orders:   POCT URINALYSIS DIP (CLINITEK)

## 2024-05-14 NOTE — Assessment & Plan Note (Signed)
 Chronic pain persists post knee replacement. Current management includes oxycodone , ibuprofen, and physical therapy. - Consider restarting Celebrex  after consulting with orthopedic surgeon. - Continue physical therapy twice a week. - Use oxycodone  and ibuprofen for pain management as needed. - Follow up with orthopedic surgeon on October 23rd.

## 2024-05-16 DIAGNOSIS — R269 Unspecified abnormalities of gait and mobility: Secondary | ICD-10-CM | POA: Diagnosis not present

## 2024-05-16 DIAGNOSIS — M25562 Pain in left knee: Secondary | ICD-10-CM | POA: Diagnosis not present

## 2024-05-16 DIAGNOSIS — T84033D Mechanical loosening of internal left knee prosthetic joint, subsequent encounter: Secondary | ICD-10-CM | POA: Diagnosis not present

## 2024-05-16 DIAGNOSIS — G8929 Other chronic pain: Secondary | ICD-10-CM | POA: Diagnosis not present

## 2024-05-16 DIAGNOSIS — M25662 Stiffness of left knee, not elsewhere classified: Secondary | ICD-10-CM | POA: Diagnosis not present

## 2024-05-21 DIAGNOSIS — M25562 Pain in left knee: Secondary | ICD-10-CM | POA: Diagnosis not present

## 2024-05-21 DIAGNOSIS — R29898 Other symptoms and signs involving the musculoskeletal system: Secondary | ICD-10-CM | POA: Diagnosis not present

## 2024-05-21 DIAGNOSIS — M25662 Stiffness of left knee, not elsewhere classified: Secondary | ICD-10-CM | POA: Diagnosis not present

## 2024-05-21 DIAGNOSIS — G8929 Other chronic pain: Secondary | ICD-10-CM | POA: Diagnosis not present

## 2024-05-21 DIAGNOSIS — T84033D Mechanical loosening of internal left knee prosthetic joint, subsequent encounter: Secondary | ICD-10-CM | POA: Diagnosis not present

## 2024-05-21 DIAGNOSIS — H353132 Nonexudative age-related macular degeneration, bilateral, intermediate dry stage: Secondary | ICD-10-CM | POA: Diagnosis not present

## 2024-05-21 DIAGNOSIS — H524 Presbyopia: Secondary | ICD-10-CM | POA: Diagnosis not present

## 2024-05-21 DIAGNOSIS — R269 Unspecified abnormalities of gait and mobility: Secondary | ICD-10-CM | POA: Diagnosis not present

## 2024-05-23 DIAGNOSIS — T84033D Mechanical loosening of internal left knee prosthetic joint, subsequent encounter: Secondary | ICD-10-CM | POA: Diagnosis not present

## 2024-05-23 DIAGNOSIS — G8929 Other chronic pain: Secondary | ICD-10-CM | POA: Diagnosis not present

## 2024-05-23 DIAGNOSIS — M25662 Stiffness of left knee, not elsewhere classified: Secondary | ICD-10-CM | POA: Diagnosis not present

## 2024-05-23 DIAGNOSIS — R269 Unspecified abnormalities of gait and mobility: Secondary | ICD-10-CM | POA: Diagnosis not present

## 2024-05-23 DIAGNOSIS — M25562 Pain in left knee: Secondary | ICD-10-CM | POA: Diagnosis not present

## 2024-05-28 DIAGNOSIS — R269 Unspecified abnormalities of gait and mobility: Secondary | ICD-10-CM | POA: Diagnosis not present

## 2024-05-28 DIAGNOSIS — M25562 Pain in left knee: Secondary | ICD-10-CM | POA: Diagnosis not present

## 2024-05-28 DIAGNOSIS — M25662 Stiffness of left knee, not elsewhere classified: Secondary | ICD-10-CM | POA: Diagnosis not present

## 2024-05-28 DIAGNOSIS — G8929 Other chronic pain: Secondary | ICD-10-CM | POA: Diagnosis not present

## 2024-05-28 DIAGNOSIS — T84033D Mechanical loosening of internal left knee prosthetic joint, subsequent encounter: Secondary | ICD-10-CM | POA: Diagnosis not present

## 2024-06-04 DIAGNOSIS — G8929 Other chronic pain: Secondary | ICD-10-CM | POA: Diagnosis not present

## 2024-06-04 DIAGNOSIS — R269 Unspecified abnormalities of gait and mobility: Secondary | ICD-10-CM | POA: Diagnosis not present

## 2024-06-04 DIAGNOSIS — M25562 Pain in left knee: Secondary | ICD-10-CM | POA: Diagnosis not present

## 2024-06-04 DIAGNOSIS — T84033D Mechanical loosening of internal left knee prosthetic joint, subsequent encounter: Secondary | ICD-10-CM | POA: Diagnosis not present

## 2024-06-04 DIAGNOSIS — M25662 Stiffness of left knee, not elsewhere classified: Secondary | ICD-10-CM | POA: Diagnosis not present

## 2024-06-11 DIAGNOSIS — T84033D Mechanical loosening of internal left knee prosthetic joint, subsequent encounter: Secondary | ICD-10-CM | POA: Diagnosis not present

## 2024-06-11 DIAGNOSIS — R29898 Other symptoms and signs involving the musculoskeletal system: Secondary | ICD-10-CM | POA: Diagnosis not present

## 2024-06-11 DIAGNOSIS — M25662 Stiffness of left knee, not elsewhere classified: Secondary | ICD-10-CM | POA: Diagnosis not present

## 2024-06-11 DIAGNOSIS — M25562 Pain in left knee: Secondary | ICD-10-CM | POA: Diagnosis not present

## 2024-06-11 DIAGNOSIS — G8929 Other chronic pain: Secondary | ICD-10-CM | POA: Diagnosis not present

## 2024-06-11 DIAGNOSIS — R269 Unspecified abnormalities of gait and mobility: Secondary | ICD-10-CM | POA: Diagnosis not present

## 2024-06-13 ENCOUNTER — Ambulatory Visit: Payer: Self-pay

## 2024-06-13 ENCOUNTER — Encounter: Payer: Self-pay | Admitting: Family Medicine

## 2024-06-13 ENCOUNTER — Ambulatory Visit (INDEPENDENT_AMBULATORY_CARE_PROVIDER_SITE_OTHER): Admitting: Family Medicine

## 2024-06-13 VITALS — BP 142/64 | HR 78 | Temp 97.7°F | Resp 16 | Ht 62.0 in | Wt 138.2 lb

## 2024-06-13 DIAGNOSIS — S61011A Laceration without foreign body of right thumb without damage to nail, initial encounter: Secondary | ICD-10-CM | POA: Diagnosis not present

## 2024-06-13 MED ORDER — MUPIROCIN 2 % EX OINT: 1.0000 | TOPICAL_OINTMENT | Freq: Two times a day (BID) | CUTANEOUS | 0 refills | Status: AC

## 2024-06-13 NOTE — Progress Notes (Signed)
 Acute Office Visit  Subjective:    Patient ID: Denise [name], female    DOB: [birth year], 78 y.o.   MRN: [id number]  No chief complaint on file.  Discussed the use of AI scribe software for clinical note transcription with the patient, who gave verbal consent to proceed.  History of Present Illness   Denise [name] is a 78 year old female who presents with a persistent thumb injury.  Thumb laceration and pain - Sustained a cut to the thumb near the nail two weeks ago from hard plastic packaging - Persistent pain at the site, especially with touch or minor contact - Frequent use of thumb leads to repeated minor reinjury and delayed healing - No significant bleeding unless the area is hit - No significant discharge from the wound - Wound remains sensitive and painful  Wound care measures - Applies alcohol and a salve provided by her husband to the wound - Keeps a Band-Aid on the thumb during the day and night for cushioning - Does not leave the wound open to air   Signs and symptoms of infection - No fever - No pus  Impaired wound healing risk factors - Borderline diabetic - Last hemoglobin A1c checked three months ago      Past Medical History:  Diagnosis Date   Aortic atherosclerosis 2020   Arthritis    Constipation    Diabetes mellitus without complication (HCC)    diet controlled   HTN (hypertension) 06/15/2015   Pneumonia     Past Surgical History:  Procedure Laterality Date   ANKLE SURGERY     BREAST LUMPECTOMY     5 times   CARPAL TUNNEL RELEASE     10/2015   CATARACT EXTRACTION     CHOLECYSTECTOMY     METATARSAL OSTEOTOMY Left 12/09/2020   Procedure: Left second metatarsal phalangeal joint debridement, 2-3 weil osteotomies;  Surgeon: Kit Rush, MD;  Location: [facility];  Service: Orthopedics;  Laterality: Left;  60 mins [facility] regional vs General   NASAL SINUS SURGERY     right knee arthroscopic     ROTATOR CUFF REPAIR      TOTAL KNEE ARTHROPLASTY Right 03/28/2017   Procedure: RIGHT TOTAL KNEE ARTHROPLASTY;  Surgeon: Heide Ingle, MD;  Location: [facility];  Service: Orthopedics;  Laterality: Right;   TUBAL LIGATION      Family History  Problem Relation Age of Onset   Liver disease Father    Cirrhosis Father    Bone cancer Mother    Breast cancer Other        aunts   Colon cancer Neg Hx     Social History   Socioeconomic History   Marital status: Married    Spouse name: Not on file   Number of children: Not on file   Years of education: Not on file   Highest education level: 12th grade  Occupational History   Occupation: Self Employed  Tobacco Use   Smoking status: Never   Smokeless tobacco: Never  Vaping Use   Vaping status: Never Used  Substance and Sexual Activity   Alcohol use: No   Drug use: No   Sexual activity: Yes  Other Topics Concern   Not on file  Social History Narrative   Not on file   Social Drivers of Health   Financial Resource Strain: Low Risk  (12/14/2022)   Overall Financial Resource Strain (CARDIA)    Difficulty of Paying Living Expenses: Not hard at  all  Food Insecurity: No Food Insecurity (12/14/2022)   Hunger Vital Sign    Worried About Running Out of Food in the Last Year: Never true    Ran Out of Food in the Last Year: Never true  Transportation Needs: No Transportation Needs (12/14/2022)   PRAPARE - Administrator, Civil Service (Medical): No    Lack of Transportation (Non-Medical): No  Physical Activity: Insufficiently Active (12/14/2022)   Exercise Vital Sign    Days of Exercise per Week: 2 days    Minutes of Exercise per Session: 50 min  Stress: No Stress Concern Present (12/14/2022)   [employer] of Occupational Health - Occupational Stress Questionnaire    Feeling of Stress : Not at all  Social Connections: Socially Integrated (07/11/2023)   Social Connection and Isolation Panel    Frequency of Communication with Friends and Family: More  than three times a week    Frequency of Social Gatherings with Friends and Family: Twice a week    Attends Religious Services: More than 4 times per year    Active Member of [employer] or Organizations: No    Attends Engineer, Structural: More than 4 times per year    Marital Status: Married  Catering Manager Violence: Not At Risk (06/13/2022)   Humiliation, Afraid, Rape, and Kick questionnaire    Fear of Current or Ex-Partner: No    Emotionally Abused: No    Physically Abused: No    Sexually Abused: No    Outpatient Medications Prior to Visit  Medication Sig Dispense Refill   aspirin  81 MG chewable tablet Chew 162 mg by mouth daily.     Calcium  Carb-Cholecalciferol (CALCIUM  600 + D PO) Take 1 tablet by mouth daily.     celecoxib  (CELEBREX ) 100 MG capsule TAKE 1 CAPSULE BY MOUTH EVERY DAY 90 capsule 1   fenofibrate  160 MG tablet TAKE 1 TABLET BY MOUTH EVERY DAY 90 tablet 1   mometasone (ELOCON) 0.1 % cream mometasone 0.1 % topical cream  APPLY SPARINGLY TO AFFECTED AREA EVERY DAY     Multiple Vitamin (MULTIVITAMIN) tablet Take 1 tablet by mouth daily.     Multiple Vitamins-Minerals (PRESERVISION AREDS PO) Take by mouth.     oxyCODONE  (OXY IR/ROXICODONE ) 5 MG immediate release tablet Take 5 mg by mouth every 4 (four) hours as needed.     rosuvastatin  (CRESTOR ) 5 MG tablet TAKE 1 TABLET (5 MG TOTAL) BY MOUTH DAILY. 90 tablet 1   telmisartan  (MICARDIS ) 80 MG tablet TAKE 1 TABLET BY MOUTH EVERY DAY 90 tablet 3   vitamin C (ASCORBIC ACID) 500 MG tablet Take 500 mg by mouth daily.     No facility-administered medications prior to visit.    Allergies  Allergen Reactions   Lisinopril Other (See Comments)    cough    Review of Systems  Constitutional:  Negative for chills, diaphoresis, fatigue and fever.  HENT:  Negative for congestion, ear pain and sinus pain.   Eyes: Negative.   Respiratory:  Negative for cough and shortness of breath.   Cardiovascular:  Negative for chest  pain.  Gastrointestinal:  Negative for abdominal pain, constipation, diarrhea, nausea and vomiting.  Endocrine: Negative.   Genitourinary:  Negative for dysuria, frequency and urgency.  Musculoskeletal:  Negative for arthralgias.  Allergic/Immunologic: Negative.   Neurological:  Negative for dizziness, weakness, light-headedness and headaches.  Hematological: Negative.   Psychiatric/Behavioral:  Negative for dysphoric mood. The patient is not nervous/anxious.  Objective:        06/13/2024    9:29 AM 05/12/2024    9:39 AM 03/05/2024    8:08 AM  Vitals with BMI  Height 5' 2 5' 2 5' 2  Weight 138 lbs 3 oz 136 lbs 3 oz 140 lbs 6 oz  BMI 25.27 24.91 25.67  Systolic 142 128 869  Diastolic 64 68 60  Pulse 78 84 76    No data found.   Physical Exam Vitals reviewed.  Constitutional:      General: She is not in acute distress.    Appearance: Normal appearance.  Eyes:     Conjunctiva/sclera: Conjunctivae normal.  Cardiovascular:     Rate and Rhythm: Normal rate and regular rhythm.     Heart sounds: Normal heart sounds. No murmur heard. Pulmonary:     Effort: Pulmonary effort is normal. No respiratory distress.     Breath sounds: Normal breath sounds.  Skin:    Findings: Erythema and laceration present.  Neurological:     Mental Status: She is alert. Mental status is at baseline.  Psychiatric:        Mood and Affect: Mood normal.        Behavior: Behavior normal.     Health Maintenance Due  Topic Date Due   Zoster Vaccines- Shingrix (2 of 2) 05/07/2017   OPHTHALMOLOGY EXAM  05/06/2024   Medicare Annual Wellness (AWV)  07/10/2024    There are no preventive care reminders to display for this patient.   Lab Results  Component Value Date   TSH 2.650 12/18/2022   Lab Results  Component Value Date   WBC 5.8 03/26/2024   HGB 11.4 03/26/2024   HCT 35.6 03/26/2024   MCV 95 03/26/2024   PLT 302 03/26/2024   Lab Results  Component Value Date   NA 141  03/26/2024   K 4.3 03/26/2024   CO2 23 03/26/2024   GLUCOSE 88 03/26/2024   BUN 25 03/26/2024   CREATININE 1.11 (H) 03/26/2024   BILITOT 0.4 03/26/2024   ALKPHOS 60 03/26/2024   AST 33 03/26/2024   ALT 14 03/26/2024   PROT 6.1 03/26/2024   ALBUMIN 4.3 03/26/2024   CALCIUM  9.8 03/26/2024   ANIONGAP 3 (L) 03/30/2017   EGFR 51 (L) 03/26/2024   GFR 87.62 05/17/2011   Lab Results  Component Value Date   CHOL 138 01/11/2024   Lab Results  Component Value Date   HDL 58 01/11/2024   Lab Results  Component Value Date   LDLCALC 63 01/11/2024   Lab Results  Component Value Date   TRIG 87 01/11/2024   Lab Results  Component Value Date   CHOLHDL 2.4 01/11/2024   Lab Results  Component Value Date   HGBA1C 5.3 05/12/2024        Results for orders placed or performed in visit on 05/12/24  POCT Lipid Panel   Collection Time: 05/12/24  9:56 AM  Result Value Ref Range   TC 127    HDL 53    TRG 99    LDL 53    Non-HDL 73    TC/HDL 1.0   POCT glycosylated hemoglobin (Hb A1C)   Collection Time: 05/12/24  9:56 AM  Result Value Ref Range   Hemoglobin A1C     HbA1c POC (<> result, manual entry) 5.3 4.0 - 5.6 %   HbA1c, POC (prediabetic range)     HbA1c, POC (controlled diabetic range)    POCT URINALYSIS DIP (CLINITEK)  Collection Time: 05/12/24 12:13 PM  Result Value Ref Range   Color, UA     Clarity, UA     Glucose, UA negative negative mg/dL   Bilirubin, UA negative negative   Ketones, POC UA negative negative mg/dL   Spec Grav, UA 8.984 8.989 - 1.025   Blood, UA negative negative   pH, UA 6.5 5.0 - 8.0   POC PROTEIN,UA negative negative, trace   Urobilinogen, UA negative 0.2 or 1.0 E.U./dL   Nitrite, UA Negative Negative   Leukocytes, UA Trace (A) Negative     Assessment & Plan:   Assessment & Plan Laceration of right thumb without foreign body without damage to nail, initial encounter Laceration of right thumb without nail damage Delayed healing with  persistent pain, no infection signs. Borderline A1c noted. - Prescribed mupirocin ointment twice daily. - Advised air exposure at night for drying and healing. - Instructed follow-up if no improvement for possible oral antibiotics.  Orders:   mupirocin ointment (BACTROBAN) 2 %; Apply 1 Application topically 2 (two) times daily.   Follow-up: Return if symptoms worsen or fail to improve.  An After Visit Summary was printed and given to the patient.  Harrie Cedar, FNP [facility] [phone]

## 2024-06-13 NOTE — Telephone Encounter (Signed)
 FYI Only or Action Required?: FYI only for provider: appointment scheduled on 06/13/24.  Patient was last seen in primary care on 05/12/2024 by Sherre Clapper, MD.  Called Nurse Triage reporting Laceration.  Symptoms began several weeks ago.  Interventions attempted: Rest, hydration, or home remedies.  Symptoms are: gradually worsening.  Triage Disposition: See PCP When Office is Open (Within 3 Days)  Patient/caregiver understands and will follow disposition?:  Reason for Disposition  [1] After 3 days AND [2] pain not improved  Answer Assessment - Initial Assessment Questions 1. MECHANISM: How did the injury happen?      Cut on hard plastic  2. ONSET: When did the injury happen? (e.g., minutes, hours ago)      2 weeks  3. LOCATION: What part of the finger is injured? Is the nail damaged?      Left side of right thumb  4. APPEARANCE of the INJURY: What does the injury look like?      Red with small flap  5. SEVERITY: Can you use the hand normally?  Can you bend your fingers into a ball and then fully open them?     Not healing  6. SIZE: For cuts, bruises, or swelling, ask: How large is it? (e.g., inches or centimeters;  entire finger)      1/4  7. PAIN: Is there pain? If Yes, ask: How bad is the pain?  (Scale 0-10; or none, mild, moderate, severe)     5/10  8. TETANUS: For any breaks in the skin, ask: When was your last tetanus booster?     Unknown  Protocols used: Finger Injury-A-AH Copied from CRM [id number]. Topic: Clinical - Red Word Triage >> Jun 13, 2024  8:23 AM Adelita E wrote: [facility] that prompted transfer to Nurse Triage: Cut through thumbnail on right hand, not healing correctly. Cut happened 2 weeks, hurts to even touch.

## 2024-06-14 ENCOUNTER — Other Ambulatory Visit: Payer: Self-pay | Admitting: Family Medicine

## 2024-06-14 DIAGNOSIS — R1030 Lower abdominal pain, unspecified: Secondary | ICD-10-CM

## 2024-06-15 ENCOUNTER — Encounter: Payer: Self-pay | Admitting: Family Medicine

## 2024-06-23 DIAGNOSIS — Z01419 Encounter for gynecological examination (general) (routine) without abnormal findings: Secondary | ICD-10-CM | POA: Diagnosis not present

## 2024-07-06 ENCOUNTER — Other Ambulatory Visit: Payer: Self-pay | Admitting: Family Medicine

## 2024-07-06 DIAGNOSIS — M19041 Primary osteoarthritis, right hand: Secondary | ICD-10-CM

## 2024-09-04 ENCOUNTER — Other Ambulatory Visit: Payer: Self-pay | Admitting: Family Medicine

## 2024-09-04 DIAGNOSIS — I10 Essential (primary) hypertension: Secondary | ICD-10-CM

## 2024-09-12 ENCOUNTER — Ambulatory Visit: Admitting: Family Medicine

## 2024-09-18 ENCOUNTER — Ambulatory Visit: Admitting: Family Medicine

## 2024-09-18 DIAGNOSIS — E1169 Type 2 diabetes mellitus with other specified complication: Secondary | ICD-10-CM
